# Patient Record
Sex: Female | Born: 1935 | Race: White | Hispanic: No | Smoking: Former smoker
Health system: Southern US, Community
[De-identification: ages and names within clinical notes are randomized; demographics above are authoritative.]

## PROBLEM LIST (undated history)

## (undated) DIAGNOSIS — S72009A Fracture of unspecified part of neck of unspecified femur, initial encounter for closed fracture: Secondary | ICD-10-CM

## (undated) DIAGNOSIS — E785 Hyperlipidemia, unspecified: Secondary | ICD-10-CM

## (undated) DIAGNOSIS — I10 Essential (primary) hypertension: Secondary | ICD-10-CM

## (undated) DIAGNOSIS — S42409A Unspecified fracture of lower end of unspecified humerus, initial encounter for closed fracture: Secondary | ICD-10-CM

## (undated) DIAGNOSIS — C9590 Leukemia, unspecified not having achieved remission: Secondary | ICD-10-CM

## (undated) DIAGNOSIS — C9110 Chronic lymphocytic leukemia of B-cell type not having achieved remission: Principal | ICD-10-CM

## (undated) HISTORY — DX: Leukemia, unspecified not having achieved remission: C95.90

## (undated) HISTORY — DX: Essential (primary) hypertension: I10

## (undated) HISTORY — DX: Hyperlipidemia, unspecified: E78.5

## (undated) HISTORY — DX: Chronic lymphocytic leukemia of B-cell type not having achieved remission: C91.10

## (undated) HISTORY — PX: TUBAL LIGATION: SHX77

---

## 2002-03-22 HISTORY — PX: CATARACT EXTRACTION: SUR2

## 2004-01-16 ENCOUNTER — Emergency Department (HOSPITAL_COMMUNITY): Admission: EM | Admit: 2004-01-16 | Discharge: 2004-01-16 | Payer: Self-pay | Admitting: Emergency Medicine

## 2004-08-29 ENCOUNTER — Ambulatory Visit: Payer: Self-pay | Admitting: Family Medicine

## 2004-10-30 ENCOUNTER — Ambulatory Visit: Payer: Self-pay | Admitting: Family Medicine

## 2004-11-07 ENCOUNTER — Ambulatory Visit: Payer: Self-pay | Admitting: Family Medicine

## 2004-12-06 ENCOUNTER — Ambulatory Visit: Payer: Self-pay | Admitting: Family Medicine

## 2005-07-03 ENCOUNTER — Ambulatory Visit: Payer: Self-pay | Admitting: Family Medicine

## 2005-07-04 ENCOUNTER — Ambulatory Visit: Payer: Self-pay | Admitting: Family Medicine

## 2005-07-06 ENCOUNTER — Ambulatory Visit: Payer: Self-pay | Admitting: Family Medicine

## 2005-07-12 ENCOUNTER — Ambulatory Visit: Payer: Self-pay | Admitting: Family Medicine

## 2005-08-06 ENCOUNTER — Ambulatory Visit: Payer: Self-pay

## 2005-08-06 ENCOUNTER — Encounter: Payer: Self-pay | Admitting: Family Medicine

## 2005-09-06 ENCOUNTER — Ambulatory Visit: Payer: Self-pay | Admitting: Family Medicine

## 2005-10-30 ENCOUNTER — Ambulatory Visit: Payer: Self-pay | Admitting: Family Medicine

## 2006-02-07 ENCOUNTER — Ambulatory Visit: Payer: Self-pay | Admitting: Family Medicine

## 2006-05-14 ENCOUNTER — Ambulatory Visit: Payer: Self-pay | Admitting: Family Medicine

## 2006-06-17 ENCOUNTER — Ambulatory Visit: Payer: Self-pay | Admitting: Family Medicine

## 2006-08-21 ENCOUNTER — Ambulatory Visit: Payer: Self-pay | Admitting: Family Medicine

## 2006-10-01 ENCOUNTER — Ambulatory Visit: Payer: Self-pay | Admitting: Family Medicine

## 2006-10-01 LAB — CONVERTED CEMR LAB
ALT: 19 U/L
AST: 28 U/L
Albumin: 3.7 g/dL
Alkaline Phosphatase: 62 U/L
BUN: 6 mg/dL
CO2: 25 meq/L
Calcium: 9.3 mg/dL
Chloride: 109 meq/L
Creatinine, Ser: 0.7 mg/dL
GFR calc non Af Amer: 86 mL/min
Glomerular Filtration Rate, Af Am: 104 mL/min/{1.73_m2}
Glucose, Bld: 92 mg/dL
Potassium: 4 meq/L
Sodium: 141 meq/L
Total Bilirubin: 0.7 mg/dL
Total Protein: 6.1 g/dL

## 2007-01-23 ENCOUNTER — Ambulatory Visit: Payer: Self-pay | Admitting: Family Medicine

## 2007-01-23 LAB — CONVERTED CEMR LAB
ALT: 20 units/L (ref 0–40)
AST: 23 units/L (ref 0–37)
Albumin: 3.5 g/dL (ref 3.5–5.2)
Alkaline Phosphatase: 73 units/L (ref 39–117)
BUN: 10 mg/dL (ref 6–23)
Bilirubin, Direct: 0.1 mg/dL (ref 0.0–0.3)
CO2: 28 meq/L (ref 19–32)
Calcium: 9.1 mg/dL (ref 8.4–10.5)
Chloride: 110 meq/L (ref 96–112)
Creatinine, Ser: 0.6 mg/dL (ref 0.4–1.2)
GFR calc Af Amer: 125 mL/min
GFR calc non Af Amer: 103 mL/min
Glucose, Bld: 74 mg/dL (ref 70–99)
Potassium: 3.7 meq/L (ref 3.5–5.1)
Sodium: 142 meq/L (ref 135–145)
Total Bilirubin: 0.6 mg/dL (ref 0.3–1.2)
Total Protein: 5.9 g/dL — ABNORMAL LOW (ref 6.0–8.3)

## 2007-01-28 ENCOUNTER — Encounter: Payer: Self-pay | Admitting: Family Medicine

## 2007-05-07 ENCOUNTER — Ambulatory Visit: Payer: Self-pay | Admitting: Family Medicine

## 2007-05-07 DIAGNOSIS — M810 Age-related osteoporosis without current pathological fracture: Secondary | ICD-10-CM | POA: Insufficient documentation

## 2007-05-07 DIAGNOSIS — E785 Hyperlipidemia, unspecified: Secondary | ICD-10-CM | POA: Insufficient documentation

## 2007-05-12 LAB — CONVERTED CEMR LAB
ALT: 15 units/L (ref 0–35)
AST: 23 units/L (ref 0–37)
Albumin: 3.8 g/dL (ref 3.5–5.2)
Alkaline Phosphatase: 68 units/L (ref 39–117)
BUN: 12 mg/dL (ref 6–23)
Basophils Absolute: 0 10*3/uL (ref 0.0–0.1)
Basophils Relative: 0.1 % (ref 0.0–1.0)
Bilirubin, Direct: 0.1 mg/dL (ref 0.0–0.3)
CO2: 29 meq/L (ref 19–32)
Calcium: 9.3 mg/dL (ref 8.4–10.5)
Chloride: 108 meq/L (ref 96–112)
Cholesterol: 181 mg/dL (ref 0–200)
Creatinine, Ser: 0.7 mg/dL (ref 0.4–1.2)
Eosinophils Absolute: 0.3 10*3/uL (ref 0.0–0.6)
Eosinophils Relative: 1.8 % (ref 0.0–5.0)
GFR calc Af Amer: 104 mL/min
GFR calc non Af Amer: 86 mL/min
Glucose, Bld: 90 mg/dL (ref 70–99)
HCT: 40.6 % (ref 36.0–46.0)
HDL: 57.9 mg/dL (ref >39.0)
Hemoglobin: 14.1 g/dL (ref 12.0–15.0)
LDL Cholesterol: 96 mg/dL (ref 0–99)
Lymphocytes Relative: 65.7 % — ABNORMAL HIGH (ref 12.0–46.0)
MCHC: 34.8 g/dL (ref 30.0–36.0)
MCV: 90.5 fL (ref 78.0–100.0)
Monocytes Absolute: 1 10*3/uL — ABNORMAL HIGH (ref 0.2–0.7)
Monocytes Relative: 6.7 % (ref 3.0–11.0)
Neutro Abs: 3.8 10*3/uL (ref 1.4–7.7)
Neutrophils Relative %: 25.7 % — ABNORMAL LOW (ref 43.0–77.0)
Platelets: 255 10*3/uL (ref 150–400)
Potassium: 4.1 meq/L (ref 3.5–5.1)
RBC: 4.49 M/uL (ref 3.87–5.11)
RDW: 12.4 % (ref 11.5–14.6)
Sodium: 144 meq/L (ref 135–145)
Total Bilirubin: 0.7 mg/dL (ref 0.3–1.2)
Total CHOL/HDL Ratio: 3.1
Total Protein: 6.3 g/dL (ref 6.0–8.3)
Triglycerides: 138 mg/dL (ref 0–149)
VLDL: 28 mg/dL (ref 0–40)
WBC: 14.8 10*3/uL — ABNORMAL HIGH (ref 4.5–10.5)

## 2007-05-26 ENCOUNTER — Ambulatory Visit: Payer: Self-pay | Admitting: Family Medicine

## 2007-05-27 DIAGNOSIS — D72829 Elevated white blood cell count, unspecified: Secondary | ICD-10-CM

## 2007-05-27 LAB — CONVERTED CEMR LAB
Basophils Absolute: 0 10*3/uL (ref 0.0–0.1)
Basophils Relative: 0.1 % (ref 0.0–1.0)
Eosinophils Absolute: 0.3 10*3/uL (ref 0.0–0.6)
Eosinophils Relative: 2.3 % (ref 0.0–5.0)
HCT: 37.8 % (ref 36.0–46.0)
Hemoglobin: 13.1 g/dL (ref 12.0–15.0)
Lymphocytes Relative: 65.2 % — ABNORMAL HIGH (ref 12.0–46.0)
MCHC: 34.6 g/dL (ref 30.0–36.0)
MCV: 91.5 fL (ref 78.0–100.0)
Monocytes Absolute: 1 10*3/uL — ABNORMAL HIGH (ref 0.2–0.7)
Monocytes Relative: 7.1 % (ref 3.0–11.0)
Neutro Abs: 3.6 10*3/uL (ref 1.4–7.7)
Neutrophils Relative %: 25.3 % — ABNORMAL LOW (ref 43.0–77.0)
Platelets: 238 10*3/uL (ref 150–400)
RBC: 4.13 M/uL (ref 3.87–5.11)
RDW: 12.5 % (ref 11.5–14.6)
WBC: 14.1 10*3/uL — ABNORMAL HIGH (ref 4.5–10.5)

## 2007-05-28 ENCOUNTER — Telehealth: Payer: Self-pay | Admitting: Family Medicine

## 2007-05-30 ENCOUNTER — Ambulatory Visit: Payer: Self-pay | Admitting: Oncology

## 2007-06-17 ENCOUNTER — Ambulatory Visit: Payer: Self-pay | Admitting: Family Medicine

## 2007-06-17 DIAGNOSIS — J019 Acute sinusitis, unspecified: Secondary | ICD-10-CM

## 2007-06-17 HISTORY — DX: Acute sinusitis, unspecified: J01.90

## 2007-07-01 ENCOUNTER — Ambulatory Visit: Payer: Self-pay | Admitting: Family Medicine

## 2007-07-01 DIAGNOSIS — D7289 Other specified disorders of white blood cells: Secondary | ICD-10-CM | POA: Insufficient documentation

## 2007-07-03 LAB — CONVERTED CEMR LAB
Basophils Absolute: 0 10*3/uL (ref 0.0–0.1)
Basophils Relative: 0 % (ref 0.0–1.0)
Eosinophils Absolute: 0.3 10*3/uL (ref 0.0–0.6)
Eosinophils Relative: 2.2 % (ref 0.0–5.0)
HCT: 37.6 % (ref 36.0–46.0)
Hemoglobin: 13.1 g/dL (ref 12.0–15.0)
Lymphocytes Relative: 60.5 % — ABNORMAL HIGH (ref 12.0–46.0)
MCHC: 34.8 g/dL (ref 30.0–36.0)
MCV: 92.4 fL (ref 78.0–100.0)
Monocytes Absolute: 0.9 10*3/uL — ABNORMAL HIGH (ref 0.2–0.7)
Monocytes Relative: 7.4 % (ref 3.0–11.0)
Neutro Abs: 3.8 10*3/uL (ref 1.4–7.7)
Neutrophils Relative %: 29.9 % — ABNORMAL LOW (ref 43.0–77.0)
Platelets: 226 10*3/uL (ref 150–400)
RBC: 4.07 M/uL (ref 3.87–5.11)
RDW: 12.8 % (ref 11.5–14.6)
WBC: 12.7 10*3/uL — ABNORMAL HIGH (ref 4.5–10.5)

## 2007-08-19 ENCOUNTER — Encounter (HOSPITAL_COMMUNITY): Payer: Self-pay | Admitting: Oncology

## 2007-08-19 ENCOUNTER — Other Ambulatory Visit (HOSPITAL_COMMUNITY): Payer: Self-pay | Admitting: Oncology

## 2007-08-19 ENCOUNTER — Other Ambulatory Visit: Admission: RE | Admit: 2007-08-19 | Discharge: 2007-08-19 | Payer: Self-pay | Admitting: Oncology

## 2007-08-19 ENCOUNTER — Encounter: Payer: Self-pay | Admitting: Family Medicine

## 2007-08-19 ENCOUNTER — Ambulatory Visit: Payer: Self-pay | Admitting: Oncology

## 2007-08-19 LAB — CBC WITH DIFFERENTIAL/PLATELET
Basophils Absolute: 0.1 10*3/uL (ref 0.0–0.1)
Eosinophils Absolute: 0.2 10*3/uL (ref 0.0–0.5)
HCT: 40.1 % (ref 34.8–46.6)
LYMPH%: 59.5 % — ABNORMAL HIGH (ref 14.0–48.0)
MCV: 90.2 fL (ref 81.0–101.0)
MONO#: 0.9 10*3/uL (ref 0.1–0.9)
NEUT#: 5.3 10*3/uL (ref 1.5–6.5)
NEUT%: 33.1 % — ABNORMAL LOW (ref 39.6–76.8)
Platelets: 260 10*3/uL (ref 145–400)
WBC: 16.1 10*3/uL — ABNORMAL HIGH (ref 3.9–10.0)

## 2007-08-19 LAB — COMPREHENSIVE METABOLIC PANEL
CO2: 23 mEq/L (ref 19–32)
Calcium: 9.8 mg/dL (ref 8.4–10.5)
Creatinine, Ser: 0.67 mg/dL (ref 0.40–1.20)
Glucose, Bld: 106 mg/dL — ABNORMAL HIGH (ref 70–99)
Total Bilirubin: 0.3 mg/dL (ref 0.3–1.2)
Total Protein: 6.6 g/dL (ref 6.0–8.3)

## 2007-08-19 LAB — LACTATE DEHYDROGENASE: LDH: 155 U/L (ref 94–250)

## 2007-09-03 ENCOUNTER — Ambulatory Visit: Payer: Self-pay | Admitting: Family Medicine

## 2007-09-22 ENCOUNTER — Ambulatory Visit: Payer: Self-pay | Admitting: Family Medicine

## 2007-09-22 DIAGNOSIS — C9110 Chronic lymphocytic leukemia of B-cell type not having achieved remission: Secondary | ICD-10-CM

## 2007-09-22 DIAGNOSIS — J069 Acute upper respiratory infection, unspecified: Secondary | ICD-10-CM | POA: Insufficient documentation

## 2007-09-22 HISTORY — DX: Chronic lymphocytic leukemia of B-cell type not having achieved remission: C91.10

## 2007-10-17 ENCOUNTER — Ambulatory Visit: Payer: Self-pay | Admitting: Oncology

## 2007-10-21 ENCOUNTER — Encounter: Payer: Self-pay | Admitting: Family Medicine

## 2007-10-21 LAB — CBC WITH DIFFERENTIAL/PLATELET
BASO%: 0.9 % (ref 0.0–2.0)
HCT: 39.8 % (ref 34.8–46.6)
HGB: 13.6 g/dL (ref 11.6–15.9)
MCHC: 34.3 g/dL (ref 32.0–36.0)
MONO#: 0.8 10*3/uL (ref 0.1–0.9)
NEUT%: 28.2 % — ABNORMAL LOW (ref 39.6–76.8)
RDW: 13.5 % (ref 11.3–14.5)
WBC: 15.6 10*3/uL — ABNORMAL HIGH (ref 3.9–10.0)
lymph#: 9.9 10*3/uL — ABNORMAL HIGH (ref 0.9–3.3)

## 2007-10-21 LAB — COMPREHENSIVE METABOLIC PANEL
ALT: 18 U/L (ref 0–35)
Albumin: 4.2 g/dL (ref 3.5–5.2)
CO2: 25 mEq/L (ref 19–32)
Calcium: 9.8 mg/dL (ref 8.4–10.5)
Chloride: 106 mEq/L (ref 96–112)
Creatinine, Ser: 0.61 mg/dL (ref 0.40–1.20)
Potassium: 4.3 mEq/L (ref 3.5–5.3)
Total Protein: 6.4 g/dL (ref 6.0–8.3)

## 2008-01-21 HISTORY — PX: CATARACT EXTRACTION: SUR2

## 2008-02-13 ENCOUNTER — Ambulatory Visit: Payer: Self-pay | Admitting: Oncology

## 2008-02-17 LAB — COMPREHENSIVE METABOLIC PANEL
AST: 15 U/L (ref 0–37)
Alkaline Phosphatase: 57 U/L (ref 39–117)
BUN: 12 mg/dL (ref 6–23)
Glucose, Bld: 84 mg/dL (ref 70–99)
Sodium: 141 mEq/L (ref 135–145)
Total Bilirubin: 0.3 mg/dL (ref 0.3–1.2)

## 2008-02-17 LAB — CBC WITH DIFFERENTIAL/PLATELET
EOS%: 1.5 % (ref 0.0–7.0)
Eosinophils Absolute: 0.2 10*3/uL (ref 0.0–0.5)
LYMPH%: 65.3 % — ABNORMAL HIGH (ref 14.0–48.0)
MCH: 31.1 pg (ref 26.0–34.0)
MCV: 91 fL (ref 81.0–101.0)
MONO%: 6 % (ref 0.0–13.0)
NEUT#: 4.1 10*3/uL (ref 1.5–6.5)
Platelets: 283 10*3/uL (ref 145–400)
RBC: 4.17 10*6/uL (ref 3.70–5.32)
RDW: 13.4 % (ref 11.3–14.5)

## 2008-05-21 ENCOUNTER — Telehealth (INDEPENDENT_AMBULATORY_CARE_PROVIDER_SITE_OTHER): Payer: Self-pay | Admitting: *Deleted

## 2008-05-27 ENCOUNTER — Ambulatory Visit: Payer: Self-pay | Admitting: Family Medicine

## 2008-05-28 ENCOUNTER — Encounter (INDEPENDENT_AMBULATORY_CARE_PROVIDER_SITE_OTHER): Payer: Self-pay | Admitting: *Deleted

## 2008-05-28 LAB — CONVERTED CEMR LAB
ALT: 18 units/L (ref 0–35)
AST: 23 units/L (ref 0–37)
Albumin: 3.9 g/dL (ref 3.5–5.2)
Alkaline Phosphatase: 75 units/L (ref 39–117)
BUN: 7 mg/dL (ref 6–23)
Basophils Absolute: 0 10*3/uL (ref 0.0–0.1)
Basophils Relative: 0.2 % (ref 0.0–3.0)
Bilirubin, Direct: 0.1 mg/dL (ref 0.0–0.3)
CO2: 28 meq/L (ref 19–32)
Calcium: 9.5 mg/dL (ref 8.4–10.5)
Chloride: 105 meq/L (ref 96–112)
Cholesterol: 176 mg/dL (ref 0–200)
Creatinine, Ser: 0.7 mg/dL (ref 0.4–1.2)
Eosinophils Absolute: 0.3 10*3/uL (ref 0.0–0.7)
Eosinophils Relative: 1.6 % (ref 0.0–5.0)
GFR calc Af Amer: 104 mL/min
GFR calc non Af Amer: 86 mL/min
Glucose, Bld: 93 mg/dL (ref 70–99)
HCT: 41.2 % (ref 36.0–46.0)
HDL: 54.8 mg/dL (ref >39.0)
Hemoglobin: 14.2 g/dL (ref 12.0–15.0)
LDL Cholesterol: 89 mg/dL (ref 0–99)
Lymphocytes Relative: 68.3 % — ABNORMAL HIGH (ref 12.0–46.0)
MCHC: 34.6 g/dL (ref 30.0–36.0)
MCV: 92.6 fL (ref 78.0–100.0)
Monocytes Absolute: 0.8 10*3/uL (ref 0.1–1.0)
Monocytes Relative: 4.9 % (ref 3.0–12.0)
Neutro Abs: 4.2 10*3/uL (ref 1.4–7.7)
Neutrophils Relative %: 25 % — ABNORMAL LOW (ref 43.0–77.0)
Platelets: 252 10*3/uL (ref 150–400)
Potassium: 4.3 meq/L (ref 3.5–5.1)
RBC: 4.45 M/uL (ref 3.87–5.11)
RDW: 12.9 % (ref 11.5–14.6)
Sodium: 140 meq/L (ref 135–145)
Total Bilirubin: 0.8 mg/dL (ref 0.3–1.2)
Total CHOL/HDL Ratio: 3.2
Total Protein: 6.5 g/dL (ref 6.0–8.3)
Triglycerides: 163 mg/dL — ABNORMAL HIGH (ref 0–149)
VLDL: 33 mg/dL (ref 0–40)
WBC: 16.8 10*3/uL — ABNORMAL HIGH (ref 4.5–10.5)

## 2008-06-16 ENCOUNTER — Ambulatory Visit: Payer: Self-pay | Admitting: Oncology

## 2008-06-18 ENCOUNTER — Encounter: Payer: Self-pay | Admitting: Family Medicine

## 2008-06-18 LAB — COMPREHENSIVE METABOLIC PANEL
Alkaline Phosphatase: 73 U/L (ref 39–117)
BUN: 10 mg/dL (ref 6–23)
Glucose, Bld: 81 mg/dL (ref 70–99)
Sodium: 139 mEq/L (ref 135–145)
Total Bilirubin: 0.4 mg/dL (ref 0.3–1.2)
Total Protein: 6.4 g/dL (ref 6.0–8.3)

## 2008-06-18 LAB — CBC WITH DIFFERENTIAL/PLATELET
EOS%: 2.1 % (ref 0.0–7.0)
Eosinophils Absolute: 0.4 10*3/uL (ref 0.0–0.5)
LYMPH%: 65 % — ABNORMAL HIGH (ref 14.0–48.0)
MCH: 31.3 pg (ref 26.0–34.0)
MCV: 93 fL (ref 81.0–101.0)
MONO%: 4.9 % (ref 0.0–13.0)
NEUT#: 4.8 10*3/uL (ref 1.5–6.5)
Platelets: 244 10*3/uL (ref 145–400)
RBC: 4.35 10*6/uL (ref 3.70–5.32)

## 2008-08-04 ENCOUNTER — Ambulatory Visit: Payer: Self-pay | Admitting: Family Medicine

## 2008-12-08 ENCOUNTER — Ambulatory Visit: Payer: Self-pay | Admitting: Family Medicine

## 2008-12-09 ENCOUNTER — Telehealth (INDEPENDENT_AMBULATORY_CARE_PROVIDER_SITE_OTHER): Payer: Self-pay | Admitting: *Deleted

## 2008-12-09 ENCOUNTER — Encounter (INDEPENDENT_AMBULATORY_CARE_PROVIDER_SITE_OTHER): Payer: Self-pay | Admitting: *Deleted

## 2008-12-14 ENCOUNTER — Ambulatory Visit: Payer: Self-pay | Admitting: Oncology

## 2008-12-16 ENCOUNTER — Encounter: Payer: Self-pay | Admitting: Family Medicine

## 2008-12-16 LAB — CBC WITH DIFFERENTIAL/PLATELET
BASO%: 0.2 % (ref 0.0–2.0)
EOS%: 1.1 % (ref 0.0–7.0)
HCT: 40.6 % (ref 34.8–46.6)
HGB: 13.6 g/dL (ref 11.6–15.9)
MCHC: 33.5 g/dL (ref 31.5–36.0)
MONO#: 0.9 10*3/uL (ref 0.1–0.9)
MONO%: 5.4 % (ref 0.0–14.0)
NEUT#: 4.4 10*3/uL (ref 1.5–6.5)
Platelets: 248 10*3/uL (ref 145–400)
WBC: 16 10*3/uL — ABNORMAL HIGH (ref 3.9–10.3)

## 2008-12-16 LAB — COMPREHENSIVE METABOLIC PANEL
ALT: 14 U/L (ref 0–35)
AST: 19 U/L (ref 0–37)
Albumin: 4.3 g/dL (ref 3.5–5.2)
Alkaline Phosphatase: 62 U/L (ref 39–117)
BUN: 11 mg/dL (ref 6–23)
Potassium: 4.6 mEq/L (ref 3.5–5.3)
Sodium: 140 mEq/L (ref 135–145)
Total Protein: 6.2 g/dL (ref 6.0–8.3)

## 2009-03-11 ENCOUNTER — Ambulatory Visit: Payer: Self-pay | Admitting: Family Medicine

## 2009-03-15 ENCOUNTER — Encounter: Payer: Self-pay | Admitting: Family Medicine

## 2009-03-24 ENCOUNTER — Encounter (INDEPENDENT_AMBULATORY_CARE_PROVIDER_SITE_OTHER): Payer: Self-pay | Admitting: *Deleted

## 2009-03-25 ENCOUNTER — Encounter (INDEPENDENT_AMBULATORY_CARE_PROVIDER_SITE_OTHER): Payer: Self-pay | Admitting: *Deleted

## 2009-03-28 ENCOUNTER — Telehealth: Payer: Self-pay | Admitting: Family Medicine

## 2009-04-19 ENCOUNTER — Ambulatory Visit: Payer: Self-pay | Admitting: Family Medicine

## 2009-04-22 ENCOUNTER — Telehealth: Payer: Self-pay | Admitting: Family Medicine

## 2009-05-04 ENCOUNTER — Encounter (INDEPENDENT_AMBULATORY_CARE_PROVIDER_SITE_OTHER): Payer: Self-pay | Admitting: *Deleted

## 2009-05-04 LAB — CONVERTED CEMR LAB
Alkaline Phosphatase: 52 units/L (ref 39–117)
BUN: 11 mg/dL (ref 6–23)
Bilirubin, Direct: 0 mg/dL (ref 0.0–0.3)
Chloride: 107 meq/L (ref 96–112)
Creatinine, Ser: 0.6 mg/dL (ref 0.4–1.2)
GGT: 26 units/L (ref 7–51)
Glucose, Bld: 73 mg/dL (ref 70–99)
HCV Ab: NEGATIVE
Total Bilirubin: 0.7 mg/dL (ref 0.3–1.2)
Total Protein: 6.3 g/dL (ref 6.0–8.3)

## 2009-07-12 ENCOUNTER — Ambulatory Visit: Payer: Self-pay | Admitting: Oncology

## 2009-07-14 ENCOUNTER — Encounter: Payer: Self-pay | Admitting: Family Medicine

## 2009-07-14 LAB — CBC WITH DIFFERENTIAL/PLATELET
BASO%: 0.3 % (ref 0.0–2.0)
EOS%: 1.1 % (ref 0.0–7.0)
HGB: 13.9 g/dL (ref 11.6–15.9)
MCH: 31.7 pg (ref 25.1–34.0)
MCHC: 34.2 g/dL (ref 31.5–36.0)
RBC: 4.38 10*6/uL (ref 3.70–5.45)
RDW: 13.5 % (ref 11.2–14.5)
lymph#: 9.8 10*3/uL — ABNORMAL HIGH (ref 0.9–3.3)

## 2009-07-14 LAB — COMPREHENSIVE METABOLIC PANEL
ALT: 18 U/L (ref 0–35)
AST: 21 U/L (ref 0–37)
Albumin: 4.3 g/dL (ref 3.5–5.2)
Alkaline Phosphatase: 67 U/L (ref 39–117)
Calcium: 9.6 mg/dL (ref 8.4–10.5)
Chloride: 105 mEq/L (ref 96–112)
Potassium: 4.3 mEq/L (ref 3.5–5.3)
Sodium: 141 mEq/L (ref 135–145)

## 2009-08-03 ENCOUNTER — Ambulatory Visit: Payer: Self-pay | Admitting: Family Medicine

## 2009-09-07 ENCOUNTER — Ambulatory Visit: Payer: Self-pay | Admitting: Family Medicine

## 2009-09-13 ENCOUNTER — Encounter: Payer: Self-pay | Admitting: Family Medicine

## 2009-09-21 ENCOUNTER — Telehealth (INDEPENDENT_AMBULATORY_CARE_PROVIDER_SITE_OTHER): Payer: Self-pay | Admitting: *Deleted

## 2009-09-27 ENCOUNTER — Telehealth (INDEPENDENT_AMBULATORY_CARE_PROVIDER_SITE_OTHER): Payer: Self-pay | Admitting: *Deleted

## 2009-09-29 ENCOUNTER — Telehealth (INDEPENDENT_AMBULATORY_CARE_PROVIDER_SITE_OTHER): Payer: Self-pay | Admitting: *Deleted

## 2009-10-20 ENCOUNTER — Ambulatory Visit: Payer: Self-pay | Admitting: Family Medicine

## 2010-01-06 ENCOUNTER — Ambulatory Visit: Payer: Self-pay | Admitting: Oncology

## 2010-01-10 ENCOUNTER — Encounter: Payer: Self-pay | Admitting: Family Medicine

## 2010-01-10 LAB — CBC WITH DIFFERENTIAL/PLATELET
BASO%: 0.2 % (ref 0.0–2.0)
LYMPH%: 66.7 % — ABNORMAL HIGH (ref 14.0–49.7)
MCHC: 34 g/dL (ref 31.5–36.0)
MONO#: 1.2 10*3/uL — ABNORMAL HIGH (ref 0.1–0.9)
RBC: 4.32 10*6/uL (ref 3.70–5.45)
WBC: 19.4 10*3/uL — ABNORMAL HIGH (ref 3.9–10.3)
lymph#: 13 10*3/uL — ABNORMAL HIGH (ref 0.9–3.3)

## 2010-01-10 LAB — COMPREHENSIVE METABOLIC PANEL
ALT: 14 U/L (ref 0–35)
CO2: 25 mEq/L (ref 19–32)
Sodium: 139 mEq/L (ref 135–145)
Total Bilirubin: 0.3 mg/dL (ref 0.3–1.2)
Total Protein: 6.1 g/dL (ref 6.0–8.3)

## 2010-01-10 LAB — LACTATE DEHYDROGENASE: LDH: 158 U/L (ref 94–250)

## 2010-01-23 ENCOUNTER — Ambulatory Visit: Payer: Self-pay | Admitting: Family Medicine

## 2010-01-24 ENCOUNTER — Encounter: Payer: Self-pay | Admitting: Family Medicine

## 2010-01-27 LAB — CONVERTED CEMR LAB
Basophils Absolute: 0 10*3/uL (ref 0.0–0.1)
Hemoglobin: 13.7 g/dL (ref 12.0–15.0)
Lymphocytes Relative: 87 % — ABNORMAL HIGH (ref 12–46)
Monocytes Absolute: 0.6 10*3/uL (ref 0.1–1.0)
Monocytes Relative: 5 % (ref 3–12)
Neutro Abs: 1 10*3/uL — ABNORMAL LOW (ref 1.7–7.7)
Neutrophils Relative %: 8 % — ABNORMAL LOW (ref 43–77)
RBC: 4.41 M/uL (ref 3.87–5.11)

## 2010-02-06 ENCOUNTER — Telehealth: Payer: Self-pay | Admitting: Family Medicine

## 2010-04-25 ENCOUNTER — Ambulatory Visit: Payer: Self-pay | Admitting: Family Medicine

## 2010-04-26 LAB — CONVERTED CEMR LAB
ALT: 18 units/L (ref 0–35)
AST: 25 units/L (ref 0–37)
Albumin: 3.9 g/dL (ref 3.5–5.2)
BUN: 11 mg/dL (ref 6–23)
Chloride: 107 meq/L (ref 96–112)
Cholesterol: 177 mg/dL (ref 0–200)
Creatinine, Ser: 0.6 mg/dL (ref 0.4–1.2)
Glucose, Bld: 82 mg/dL (ref 70–99)
LDL Cholesterol: 91 mg/dL (ref 0–99)
Potassium: 4.6 meq/L (ref 3.5–5.1)
Total Bilirubin: 0.6 mg/dL (ref 0.3–1.2)
Triglycerides: 82 mg/dL (ref 0.0–149.0)
VLDL: 16.4 mg/dL (ref 0.0–40.0)

## 2010-04-27 LAB — CONVERTED CEMR LAB
Basophils Absolute: 0 10*3/uL (ref 0.0–0.1)
HCT: 42.3 % (ref 36.0–46.0)
Lymphocytes Relative: 74 % — ABNORMAL HIGH (ref 12–46)
Lymphs Abs: 12.5 10*3/uL — ABNORMAL HIGH (ref 0.7–4.0)
Neutro Abs: 3.1 10*3/uL (ref 1.7–7.7)
Neutrophils Relative %: 18 % — ABNORMAL LOW (ref 43–77)
Platelets: 246 10*3/uL (ref 150–400)
RDW: 14 % (ref 11.5–15.5)
Vit D, 25-Hydroxy: 47 ng/mL (ref 30–89)
WBC: 16.9 10*3/uL — ABNORMAL HIGH (ref 4.0–10.5)

## 2010-07-11 ENCOUNTER — Ambulatory Visit: Payer: Self-pay | Admitting: Oncology

## 2010-07-13 ENCOUNTER — Encounter: Payer: Self-pay | Admitting: Family Medicine

## 2010-07-13 LAB — CBC WITH DIFFERENTIAL/PLATELET
Basophils Absolute: 0.1 10*3/uL (ref 0.0–0.1)
Eosinophils Absolute: 0.3 10*3/uL (ref 0.0–0.5)
HGB: 13.6 g/dL (ref 11.6–15.9)
MONO#: 0.9 10*3/uL (ref 0.1–0.9)
NEUT#: 3.8 10*3/uL (ref 1.5–6.5)
RBC: 4.28 10*6/uL (ref 3.70–5.45)
RDW: 12.9 % (ref 11.2–14.5)
WBC: 18.7 10*3/uL — ABNORMAL HIGH (ref 3.9–10.3)

## 2010-07-13 LAB — COMPREHENSIVE METABOLIC PANEL
Albumin: 4.5 g/dL (ref 3.5–5.2)
BUN: 11 mg/dL (ref 6–23)
CO2: 26 mEq/L (ref 19–32)
Calcium: 9.8 mg/dL (ref 8.4–10.5)
Chloride: 105 mEq/L (ref 96–112)
Glucose, Bld: 84 mg/dL (ref 70–99)
Potassium: 4.3 mEq/L (ref 3.5–5.3)
Sodium: 138 mEq/L (ref 135–145)
Total Protein: 6.3 g/dL (ref 6.0–8.3)

## 2010-07-13 LAB — LACTATE DEHYDROGENASE: LDH: 156 U/L (ref 94–250)

## 2010-08-04 ENCOUNTER — Ambulatory Visit: Payer: Self-pay | Admitting: Family Medicine

## 2010-11-07 ENCOUNTER — Other Ambulatory Visit: Payer: Self-pay | Admitting: Family Medicine

## 2010-11-07 ENCOUNTER — Telehealth: Payer: Self-pay | Admitting: Family Medicine

## 2010-11-07 ENCOUNTER — Encounter: Payer: Self-pay | Admitting: Family Medicine

## 2010-11-07 ENCOUNTER — Ambulatory Visit
Admission: RE | Admit: 2010-11-07 | Discharge: 2010-11-07 | Payer: Self-pay | Source: Home / Self Care | Attending: Family Medicine | Admitting: Family Medicine

## 2010-11-07 LAB — BASIC METABOLIC PANEL
BUN: 15 mg/dL (ref 6–23)
CO2: 28 mEq/L (ref 19–32)
Calcium: 9.6 mg/dL (ref 8.4–10.5)
Chloride: 103 mEq/L (ref 96–112)
Creatinine, Ser: 0.7 mg/dL (ref 0.4–1.2)
GFR: 91.26 mL/min (ref >60.00)
Glucose, Bld: 82 mg/dL (ref 70–99)
Potassium: 4.3 mEq/L (ref 3.5–5.1)
Sodium: 140 mEq/L (ref 135–145)

## 2010-11-07 LAB — HEPATIC FUNCTION PANEL
ALT: 26 U/L (ref 0–35)
AST: 30 U/L (ref 0–37)
Albumin: 4 g/dL (ref 3.5–5.2)
Alkaline Phosphatase: 67 U/L (ref 39–117)
Bilirubin, Direct: 0.1 mg/dL (ref 0.0–0.3)
Total Bilirubin: 0.6 mg/dL (ref 0.3–1.2)
Total Protein: 6.6 g/dL (ref 6.0–8.3)

## 2010-11-07 LAB — LIPID PANEL
Cholesterol: 179 mg/dL (ref 0–200)
HDL: 65.9 mg/dL (ref >39.00)
LDL Cholesterol: 88 mg/dL (ref 0–99)
Total CHOL/HDL Ratio: 3
Triglycerides: 127 mg/dL (ref 0.0–149.0)
VLDL: 25.4 mg/dL (ref 0.0–40.0)

## 2010-11-07 LAB — TSH: TSH: 1.5 u[IU]/mL (ref 0.35–5.50)

## 2010-11-08 LAB — CONVERTED CEMR LAB
Basophils Absolute: 0 10*3/uL (ref 0.0–0.1)
Basophils Relative: 0 % (ref 0–1)
Hemoglobin: 14.7 g/dL (ref 12.0–15.0)
Lymphocytes Relative: 74 % — ABNORMAL HIGH (ref 12–46)
MCHC: 34 g/dL (ref 30.0–36.0)
Monocytes Absolute: 0.5 10*3/uL (ref 0.1–1.0)
Monocytes Relative: 3 % (ref 3–12)
Neutro Abs: 4 10*3/uL (ref 1.7–7.7)
Neutrophils Relative %: 21 % — ABNORMAL LOW (ref 43–77)
RBC: 4.72 M/uL (ref 3.87–5.11)
RDW: 13.2 % (ref 11.5–15.5)

## 2010-11-19 LAB — CONVERTED CEMR LAB
ALT: 19 units/L (ref 0–35)
ALT: 19 units/L (ref 0–35)
AST: 26 units/L (ref 0–37)
AST: 26 units/L (ref 0–37)
AST: 27 units/L (ref 0–37)
Albumin: 3.6 g/dL (ref 3.5–5.2)
Albumin: 3.9 g/dL (ref 3.5–5.2)
Alkaline Phosphatase: 61 units/L (ref 39–117)
Alkaline Phosphatase: 66 units/L (ref 39–117)
Alkaline Phosphatase: 67 units/L (ref 39–117)
BUN: 11 mg/dL (ref 6–23)
Basophils Absolute: 0 10*3/uL (ref 0.0–0.1)
Basophils Relative: 0.1 % (ref 0.0–3.0)
Bilirubin, Direct: 0 mg/dL (ref 0.0–0.3)
Bilirubin, Direct: 0 mg/dL (ref 0.0–0.3)
Bilirubin, Direct: 0.1 mg/dL (ref 0.0–0.3)
CO2: 28 meq/L (ref 19–32)
CO2: 28 meq/L (ref 19–32)
Calcium: 9.2 mg/dL (ref 8.4–10.5)
Calcium: 9.3 mg/dL (ref 8.4–10.5)
Calcium: 9.7 mg/dL (ref 8.4–10.5)
Chloride: 106 meq/L (ref 96–112)
Cholesterol: 178 mg/dL (ref 0–200)
Creatinine, Ser: 0.7 mg/dL (ref 0.4–1.2)
Creatinine, Ser: 0.7 mg/dL (ref 0.4–1.2)
Creatinine, Ser: 0.7 mg/dL (ref 0.4–1.2)
Direct LDL: 93.9 mg/dL
Eosinophils Absolute: 0.3 10*3/uL (ref 0.0–0.7)
Eosinophils Relative: 2.1 % (ref 0.0–5.0)
GFR calc Af Amer: 104 mL/min
GFR calc non Af Amer: 85.43 mL/min (ref >60)
GFR calc non Af Amer: 86 mL/min
Glucose, Bld: 76 mg/dL (ref 70–99)
HCT: 40.1 % (ref 36.0–46.0)
HDL: 52.7 mg/dL (ref >39.0)
Hemoglobin: 14 g/dL (ref 12.0–15.0)
Lymphocytes Relative: 72.6 % — ABNORMAL HIGH (ref 12.0–46.0)
MCHC: 34.9 g/dL (ref 30.0–36.0)
MCV: 90.6 fL (ref 78.0–100.0)
Monocytes Absolute: 0.9 10*3/uL (ref 0.1–1.0)
Monocytes Relative: 5.9 % (ref 3.0–12.0)
Neutro Abs: 3 10*3/uL (ref 1.4–7.7)
Neutrophils Relative %: 19.3 % — ABNORMAL LOW (ref 43.0–77.0)
Platelets: 232 10*3/uL (ref 150–400)
Potassium: 4.1 meq/L (ref 3.5–5.1)
RBC: 4.43 M/uL (ref 3.87–5.11)
RDW: 13 % (ref 11.5–14.6)
Sodium: 142 meq/L (ref 135–145)
Sodium: 143 meq/L (ref 135–145)
Total Bilirubin: 0.5 mg/dL (ref 0.3–1.2)
Total Bilirubin: 0.6 mg/dL (ref 0.3–1.2)
Total Bilirubin: 0.7 mg/dL (ref 0.3–1.2)
Total CHOL/HDL Ratio: 3.4
Total Protein: 6.2 g/dL (ref 6.0–8.3)
Total Protein: 6.2 g/dL (ref 6.0–8.3)
Total Protein: 6.5 g/dL (ref 6.0–8.3)
Triglycerides: 210 mg/dL (ref 0–149)
VLDL: 42 mg/dL — ABNORMAL HIGH (ref 0–40)
WBC: 15.7 10*3/uL — ABNORMAL HIGH (ref 4.5–10.5)

## 2010-11-21 NOTE — Assessment & Plan Note (Signed)
Summary: 6 month ov(30 mim)//ccm//rsh from bmp//lch   Vital Signs:  Patient profile:   77 year old female Height:      62 inches (157.48 cm) Weight:      134.13 pounds (60.97 kg) BMI:     24.62 O2 Sat:      95 % on Room air Temp:     97.8 degrees F (36.56 degrees C) oral Pulse rate:   88 / minute BP sitting:   140 / 80  (right arm)  Vitals Entered By: Lucious Groves (April 25, 2010 8:28 AM)  O2 Flow:  Room air CC: 6 mo ov./kb Is Patient Diabetic? No Comments Patient states that she would like to take Lovaza alternative OTC and is needs refill of Simvastatin./kb   History of Present Illness:  Hyperlipidemia follow-up      This is an 77 year old woman who presents for Hyperlipidemia follow-up.  The patient denies muscle aches, GI upset, abdominal pain, flushing, itching, constipation, diarrhea, and fatigue.  The patient denies the following symptoms: chest pain/pressure, exercise intolerance, dypsnea, palpitations, syncope, and pedal edema.  Compliance with medications (by patient report) has been near 100%.  Dietary compliance has been good.  The patient reports exercising occasionally.  Adjunctive measures currently used by the patient include fish oil supplements.    Preventive Screening-Counseling & Management  Alcohol-Tobacco     Alcohol drinks/day: 0     Smoking Status: quit     Year Quit: 1961     Pack years: 1.5     Passive Smoke Exposure: no  Caffeine-Diet-Exercise     Caffeine use/day: 1     Does Patient Exercise: yes     Type of exercise: walking     Times/week: <3  Hep-HIV-STD-Contraception     HIV Risk: no     Dental Visit-last 6 months yes     Dental Care Counseling: not indicated; dental care within six months     SBE monthly: yes  Safety-Violence-Falls     Seat Belt Use: 100  Current Medications (verified): 1)  Zocor 40 Mg Tabs (Simvastatin) .... Take One Tablet At Bedtime. Needs Labwork. 2)  Adult Aspirin Low Strength 81 Mg  Tbdp (Aspirin) .Marland Kitchen.. 1 Once  Daily 3)  Ranitidine Hcl 150 Mg  Caps (Ranitidine Hcl) .... Otc As Needed 4)  Calicum 5)  Mvi 6)  Fish Oil 1000 Mg Caps (Omega-3 Fatty Acids) .... 2 By Mouth Two Times A Day  Allergies (verified): No Known Drug Allergies  Past History:  Past Medical History: Last updated: 05/07/2007 Hyperlipidemia Osteoporosis  Past Surgical History: Last updated: 05/27/2008 Cataract extraction L 6/03 Tubal ligation Cataract extraction (4/09)R   Family History: Last updated: 05/07/2007 family History of Stroke F 1st degree relative <60 Alz. dementia  Park. dz  Social History: Last updated: 05/07/2007 Retired- Advice worker Married- widowed Former Smoker- 48 years ago Alcohol use-no Drug use-no Regular exercise-no  Risk Factors: Alcohol Use: 0 (04/25/2010) Caffeine Use: 1 (04/25/2010) Exercise: yes (04/25/2010)  Risk Factors: Smoking Status: quit (04/25/2010) Passive Smoke Exposure: no (04/25/2010)  Family History: Reviewed history from 05/07/2007 and no changes required. family History of Stroke F 1st degree relative <60 Alz. dementia  Park. dz  Social History: Reviewed history from 05/07/2007 and no changes required. Retired- Advice worker Married- widowed Former Smoker- 48 years ago Alcohol use-no Drug use-no Regular exercise-no Does Patient Exercise:  yes Dental Care w/in 6 mos.:  yes  Review of Systems  See HPI  Physical Exam  General:  Well-developed,well-nourished,in no acute distress; alert,appropriate and cooperative throughout examination Ears:  hearing aids b/l Neck:  bruit R carotid , supple.   Lungs:  Normal respiratory effort, chest expands symmetrically. Lungs are clear to auscultation, no crackles or wheezes. Heart:  normal rate and no murmur.   Extremities:  No clubbing, cyanosis, edema, or deformity noted with normal full range of motion of all joints.   Skin:  Intact without suspicious lesions or rashes Psych:  Oriented X3,  normally interactive, good eye contact, not anxious appearing, and not depressed appearing.     Impression & Recommendations:  Problem # 1:  HYPERLIPIDEMIA (ICD-272.4)  Her updated medication list for this problem includes:    Zocor 40 Mg Tabs (Simvastatin) .Marland Kitchen... Take one tablet at bedtime. needs labwork.  Orders: Venipuncture (98119) TLB-Lipid Panel (80061-LIPID) TLB-BMP (Basic Metabolic Panel-BMET) (80048-METABOL) TLB-CBC Platelet - w/Differential (85025-CBCD) TLB-Hepatic/Liver Function Pnl (80076-HEPATIC) T-Vitamin D (25-Hydroxy) (14782-95621)  Problem # 2:  LEUKEMIA, LYMPHOCYTIC, CHRONIC (ICD-204.10) per heme  Problem # 3:  OSTEOPOROSIS (ICD-733.00) bmd per gyn Orders: Venipuncture (30865) TLB-Lipid Panel (80061-LIPID) TLB-BMP (Basic Metabolic Panel-BMET) (80048-METABOL) TLB-CBC Platelet - w/Differential (85025-CBCD) TLB-Hepatic/Liver Function Pnl (80076-HEPATIC) T-Vitamin D (25-Hydroxy) (78469-62952)  Complete Medication List: 1)  Zocor 40 Mg Tabs (Simvastatin) .... Take one tablet at bedtime. needs labwork. 2)  Adult Aspirin Low Strength 81 Mg Tbdp (Aspirin) .Marland Kitchen.. 1 once daily 3)  Ranitidine Hcl 150 Mg Caps (Ranitidine hcl) .... Otc as needed 4)  Calicum  5)  Mvi  6)  Fish Oil 1000 Mg Caps (Omega-3 fatty acids) .... 2 by mouth two times a day  Patient Instructions: 1)  Please schedule a follow-up appointment in 6 months -- medicare physical Prescriptions: ZOCOR 40 MG TABS (SIMVASTATIN) take one tablet at bedtime. NEEDS LABWORK.  #30 Tablet x 5   Entered and Authorized by:   Loreen Freud DO   Signed by:   Loreen Freud DO on 04/25/2010   Method used:   Electronically to        [facility] [phone]* (retail)       [address]       [place], [place]  [address]       Ph: [phone]       Fax: [phone]   RxID:   [id number]   Appended Document: Orders Update    Clinical Lists Changes  Orders: Added new Test order of T- * Misc. Laboratory test  [phone]) - Signed

## 2010-11-21 NOTE — Assessment & Plan Note (Signed)
Summary: FLU SHOT///SPH  Nurse Visit Flu Vaccine Consent Questions     Do you have a history of severe allergic reactions to this vaccine? no    Any prior history of allergic reactions to egg and/or gelatin? no    Do you have a sensitivity to the preservative Thimersol? no    Do you have a past history of Guillan-Barre Syndrome? no    Do you currently have an acute febrile illness? no    Have you ever had a severe reaction to latex? no    Vaccine information given and explained to patient? yes    Are you currently pregnant? no    Lot Number:[id number]   Exp Date:04/21/2011   Site Given  Left Deltoid IM   Allergies: No Known Drug Allergies  Orders Added: 1)  Flu Vaccine 26yrs + MEDICARE PATIENTS [Q2039] 2)  Administration Flu vaccine - MCR [G0008]

## 2010-11-21 NOTE — Letter (Signed)
Summary: [facility]  [facility]   Imported By: Lanelle Bal 07/31/2010 14:20:51  _____________________________________________________________________  External Attachment:    Type:   Image     Comment:   External Document

## 2010-11-21 NOTE — Progress Notes (Signed)
Summary: Lab results   Phone Note Outgoing Call   Call placed by: Army Fossa CMA,  February 06, 2010 11:46 AM Reason for Call: Discuss lab or test results Summary of Call: Regarding lab results, LMTCB:  LDL particles and TG elevated----add fenofibrate 160 mg #30 1 by mouth once daily 2 refills recheck labs in 3 months---272.4  Nmr, hep Signed by Loreen Freud DO on 02/04/2010 at 9:58 AM  Follow-up for Phone Call        Pt states the fenofibrate messed up her liver when she took in the past. Please advise. Army Fossa CMA  February 06, 2010 3:04 PM   Additional Follow-up for Phone Call Additional follow up Details #1::        try lovaza 1 g  2 by mouth two times a day instead #120  2 refills Additional Follow-up by: Loreen Freud DO,  February 06, 2010 3:17 PM    Additional Follow-up for Phone Call Additional follow up Details #2::    Pt is aware. Army Fossa CMA  February 06, 2010 3:23 PM   New/Updated Medications: LOVAZA 1 GM CAPS (OMEGA-3-ACID ETHYL ESTERS) 2 by mouth two times a day. Prescriptions: LOVAZA 1 GM CAPS (OMEGA-3-ACID ETHYL ESTERS) 2 by mouth two times a day.  #120 x 2   Entered by:   Army Fossa CMA   Authorized by:   Loreen Freud DO   Signed by:   Army Fossa CMA on 02/06/2010   Method used:   Electronically to        [facility] [phone]* (retail)       [address]       [place], [place]  [address]       Ph: [phone]       Fax: [phone]   RxID:   [id number]

## 2010-11-21 NOTE — Letter (Signed)
[facility]   Imported By: Lanelle Bal 02/06/2010 09:34:55  _____________________________________________________________________  External Attachment:    Type:   Image     Comment:   External Document

## 2010-11-23 NOTE — Progress Notes (Signed)
Summary: [facility]  Phone Note From Other Clinic   Caller: [facility]  Summary of Call: Patient with white cell count of 18.6 and atypical lymphs. Results not final (has to be verified through [facility]) Shonna Chock CMA  November 07, 2010 1:51 PM   Follow-up for Phone Call        pt has leukemia--- she is normally at 20 Follow-up by: Loreen Freud DO,  November 07, 2010 2:00 PM  Additional Follow-up for Phone Call Additional follow up Details #1::        spoke with Clydie Braun @ [facility] and she stated that in the future it would save time; if we are aware that our patient has CLL or leukemia to just go ahead and send the labs to [place] instead of to [place]....  Additional Follow-up by: Almeta Monas CMA Duncan Dull),  November 07, 2010 3:26 PM

## 2010-11-23 NOTE — Assessment & Plan Note (Addendum)
Summary: medicare cpx/cbs   Vital Signs:  Patient profile:   77 year old female Height:      62 inches Weight:      137.0 pounds Pulse rate:   96 / minute Pulse rhythm:   regular BP sitting:   132 / 76  (right arm) Cuff size:   regular  Vitals Entered By: Almeta Monas CMA Duncan Dull) (November 07, 2010 9:54 AM) CC: cpx/fasting  Does patient need assistance? Functional Status Self care, Cook/clean, Shopping, Social activities Ambulation Normal Comments pt is able to do all ADLs and can read and write.    Vision Screening:      Vision Comments: Pt sees optho q1y hearing --- + hearing aids   History of Present Illness: Pt here for cpe and labs.  No pap.  Pt still going to cancer center for blood work.    Hyperlipidemia follow-up      This is an 77 year old woman who presents for Hyperlipidemia follow-up.  The patient denies muscle aches, GI upset, abdominal pain, flushing, itching, constipation, diarrhea, and fatigue.  The patient denies the following symptoms: chest pain/pressure, exercise intolerance, dypsnea, palpitations, syncope, and pedal edema.  Compliance with medications (by patient report) has been near 100%.  Dietary compliance has been good.  The patient reports exercising occasionally.  Adjunctive measures currently used by the patient include ASA.    Optho---[facility]--Dr Landry Corporal-- Dr Danella Deis gyn-- Dr Cliffton Asters Onc--Dr Murinson  Preventive Screening-Counseling & Management  Alcohol-Tobacco     Alcohol drinks/day: 0     Smoking Status: quit     Year Quit: 1961     Pack years: 1.5     Passive Smoke Exposure: no  Caffeine-Diet-Exercise     Caffeine use/day: 1     Does Patient Exercise: yes     Type of exercise: walking     Times/week: <3  Hep-HIV-STD-Contraception     HIV Risk: no     Dental Visit-last 6 months yes     Dental Care Counseling: not indicated; dental care within six months     SBE monthly: yes  Safety-Violence-Falls     Seat  Belt Use: 100     Firearms in the Home: firearms in the home     Firearm Counseling: not indicated; uses recommended firearm safety measures     Smoke Detectors: yes     Smoke Detector Counseling: no     Violence in the Home: no risk noted     Sexual Abuse: no     Fall Risk: no      Sexual History:  widow.    Problems Prior to Update: 1)  Preventive Health Care  (ICD-V70.0) 2)  Uri  (ICD-465.9) 3)  Leukemia, Lymphocytic, Chronic  (ICD-204.10) 4)  Disease, White Blood Cell Nec  (ICD-288.8) 5)  Sinusitis, Acute Nos  (ICD-461.9) 6)  Leukocytosis Nos  (ICD-288.60) 7)  Osteoporosis  (ICD-733.00) 8)  Hyperlipidemia  (ICD-272.4)  Medications Prior to Update: 1)  Zocor 40 Mg Tabs (Simvastatin) .... Take One Tablet At Bedtime. Needs Labwork. 2)  Adult Aspirin Low Strength 81 Mg  Tbdp (Aspirin) .Marland Kitchen.. 1 Once Daily 3)  Ranitidine Hcl 150 Mg  Caps (Ranitidine Hcl) .... Otc As Needed 4)  Calicum 5)  Mvi 6)  Fish Oil 1000 Mg Caps (Omega-3 Fatty Acids) .... 2 By Mouth Two Times A Day  Current Medications (verified): 1)  Zocor 40 Mg Tabs (Simvastatin) .... Take One Tablet At Bedtime. Needs Labwork. 2)  Adult Aspirin Low Strength 81 Mg  Tbdp (Aspirin) .Marland Kitchen.. 1 Once Daily 3)  Ranitidine Hcl 150 Mg  Caps (Ranitidine Hcl) .... Otc As Needed 4)  Calcium 600 600 Mg Tabs (Calcium Carbonate) .Marland Kitchen.. 1 By Mouth Once Daily 5)  Centrum Silver  Tabs (Multiple Vitamins-Minerals) .... By Mouth Once Daily 6)  Fish Oil 1000 Mg Caps (Omega-3 Fatty Acids) .... 2 By Mouth Two Times A Day 7)  Zostavax 16109 Unt/0.52ml Solr (Zoster Vaccine Live) .Marland Kitchen.. 1 Ml  Im X1  Allergies (verified): No Known Drug Allergies  Past History:  Past Medical History: Last updated: 05/07/2007 Hyperlipidemia Osteoporosis  Past Surgical History: Last updated: 05/27/2008 Cataract extraction L 6/03 Tubal ligation Cataract extraction (4/09)R   Family History: Last updated: 05/07/2007 family History of Stroke F 1st degree relative  <60 Alz. dementia  Park. dz  Social History: Last updated: 05/07/2007 Retired- Advice worker Married- widowed Former Smoker- 48 years ago Alcohol use-no Drug use-no Regular exercise-no  Risk Factors: Alcohol Use: 0 (11/07/2010) Caffeine Use: 1 (11/07/2010) Exercise: yes (11/07/2010)  Risk Factors: Smoking Status: quit (11/07/2010) Passive Smoke Exposure: no (11/07/2010)  Family History: Reviewed history from 05/07/2007 and no changes required. family History of Stroke F 1st degree relative <60 Alz. dementia  Park. dz  Social History: Reviewed history from 05/07/2007 and no changes required. Retired- Advice worker Married- widowed Former Smoker- 48 years ago Alcohol use-no Drug use-no Regular exercise-no Fall Risk:  no Sexual History:  widow  Review of Systems      See HPI General:  Denies chills, fatigue, fever, loss of appetite, malaise, sleep disorder, sweats, weakness, and weight loss. Eyes:  Denies blurring, discharge, double vision, eye irritation, eye pain, halos, itching, light sensitivity, red eye, vision loss-1 eye, and vision loss-both eyes. ENT:  Complains of decreased hearing; denies difficulty swallowing, ear discharge, earache, hoarseness, nasal congestion, nosebleeds, postnasal drainage, ringing in ears, sinus pressure, and sore throat; + hearing aids. CV:  Denies bluish discoloration of lips or nails, chest pain or discomfort, difficulty breathing at night, difficulty breathing while lying down, fainting, fatigue, leg cramps with exertion, lightheadness, near fainting, palpitations, shortness of breath with exertion, swelling of feet, swelling of hands, and weight gain. Resp:  Denies chest discomfort, chest pain with inspiration, cough, coughing up blood, excessive snoring, hypersomnolence, morning headaches, pleuritic, shortness of breath, sputum productive, and wheezing. GI:  Denies abdominal pain, bloody stools, change in bowel habits,  constipation, dark tarry stools, diarrhea, excessive appetite, gas, hemorrhoids, indigestion, loss of appetite, nausea, vomiting, vomiting blood, and yellowish skin color. GU:  Denies abnormal vaginal bleeding, decreased libido, discharge, dysuria, genital sores, hematuria, incontinence, nocturia, urinary frequency, and urinary hesitancy. MS:  Denies joint pain, joint redness, joint swelling, loss of strength, low back pain, mid back pain, muscle aches, muscle , cramps, muscle weakness, stiffness, and thoracic pain. Derm:  Denies changes in color of skin, changes in nail beds, dryness, excessive perspiration, flushing, hair loss, insect bite(s), itching, lesion(s), poor wound healing, and rash. Neuro:  Denies brief paralysis, difficulty with concentration, disturbances in coordination, falling down, headaches, inability to speak, memory loss, numbness, poor balance, seizures, sensation of room spinning, tingling, tremors, visual disturbances, and weakness. Psych:  Denies alternate hallucination ( auditory/visual), anxiety, depression, easily angered, easily tearful, irritability, mental problems, panic attacks, sense of great danger, suicidal thoughts/plans, thoughts of violence, unusual visions or sounds, and thoughts /plans of harming others. Endo:  Denies cold intolerance, excessive hunger, excessive thirst, excessive urination, heat intolerance, polyuria, and  weight change. Heme:  Denies abnormal bruising, bleeding, enlarge lymph nodes, fevers, pallor, and skin discoloration. Allergy:  Denies hives or rash, itching eyes, persistent infections, seasonal allergies, and sneezing.  Physical Exam  General:  Well-developed,well-nourished,in no acute distress; alert,appropriate and cooperative throughout examination Head:  Normocephalic and atraumatic without obvious abnormalities. No apparent alopecia or balding. Eyes:  pupils equal, pupils round, pupils reactive to light, and no injection.   Ears:   External ear exam shows no significant lesions or deformities.  Otoscopic examination reveals clear canals, tympanic membranes are intact bilaterally without bulging, retraction, inflammation or discharge. Hearing is grossly normal bilaterally. Nose:  External nasal examination shows no deformity or inflammation. Nasal mucosa are pink and moist without lesions or exudates. Mouth:  Oral mucosa and oropharynx without lesions or exudates.  Teeth in good repair. Neck:  No deformities, masses, or tenderness noted. Chest Wall:  No deformities, masses, or tenderness noted. Breasts:  gyn Lungs:  Normal respiratory effort, chest expands symmetrically. Lungs are clear to auscultation, no crackles or wheezes. Heart:  normal rate and no murmur.   Abdomen:  Bowel sounds positive,abdomen soft and non-tender without masses, organomegaly or hernias noted. Rectal:  gyn Genitalia:  gyn Msk:  normal ROM, no joint tenderness, no joint swelling, no joint warmth, no redness over joints, no joint deformities, no joint instability, and no crepitation.   Pulses:  R posterior tibial normal, R dorsalis pedis normal, R carotid normal, L posterior tibial normal, L dorsalis pedis normal, and L carotid normal.   Extremities:  No clubbing, cyanosis, edema, or deformity noted with normal full range of motion of all joints.   Neurologic:  No cranial nerve deficits noted. Station and gait are normal. Plantar reflexes are down-going bilaterally. DTRs are symmetrical throughout. Sensory, motor and coordinative functions appear intact. Skin:  Intact without suspicious lesions or rashes Cervical Nodes:  No lymphadenopathy noted Axillary Nodes:  No palpable lymphadenopathy Psych:  Cognition and judgment appear intact. Alert and cooperative with normal attention span and concentration. No apparent delusions, illusions, hallucinationsnot agitated, not suicidal, and not homicidal.     Impression & Recommendations:  Problem # 1:   PREVENTIVE HEALTH CARE (ICD-V70.0)  Orders: Venipuncture (16109) TLB-Lipid Panel (80061-LIPID) TLB-BMP (Basic Metabolic Panel-BMET) (80048-METABOL) TLB-CBC Platelet - w/Differential (85025-CBCD) TLB-Hepatic/Liver Function Pnl (80076-HEPATIC) TLB-TSH (Thyroid Stimulating Hormone) (84443-TSH) T-Vitamin D (25-Hydroxy) (60454-09811) Specimen Handling (91478) Medicare -1st Annual Wellness Visit [phone])  Problem # 2:  LEUKEMIA, LYMPHOCYTIC, CHRONIC (ICD-204.10)  Orders: Venipuncture (13086) TLB-Lipid Panel (80061-LIPID) TLB-BMP (Basic Metabolic Panel-BMET) (80048-METABOL) TLB-CBC Platelet - w/Differential (85025-CBCD) TLB-Hepatic/Liver Function Pnl (80076-HEPATIC) TLB-TSH (Thyroid Stimulating Hormone) (84443-TSH) T-Vitamin D (25-Hydroxy) (57846-96295) Specimen Handling (28413)  Problem # 3:  OSTEOPOROSIS (ICD-733.00)  Her updated medication list for this problem includes:    Calcium 600 600 Mg Tabs (Calcium carbonate) .Marland Kitchen... 1 by mouth once daily  Orders: Venipuncture (24401) TLB-Lipid Panel (80061-LIPID) TLB-BMP (Basic Metabolic Panel-BMET) (80048-METABOL) TLB-CBC Platelet - w/Differential (85025-CBCD) TLB-Hepatic/Liver Function Pnl (80076-HEPATIC) TLB-TSH (Thyroid Stimulating Hormone) (84443-TSH) T-Vitamin D (25-Hydroxy) (02725-36644) Specimen Handling (03474)  Bone Density: per gyn (01/05/2009) Vit D:47 (04/25/2010)  Problem # 4:  HYPERLIPIDEMIA (ICD-272.4)  Her updated medication list for this problem includes:    Zocor 40 Mg Tabs (Simvastatin) .Marland Kitchen... Take one tablet at bedtime. needs labwork.  Orders: Venipuncture (25956) TLB-Lipid Panel (80061-LIPID) TLB-BMP (Basic Metabolic Panel-BMET) (80048-METABOL) TLB-CBC Platelet - w/Differential (85025-CBCD) TLB-Hepatic/Liver Function Pnl (80076-HEPATIC) TLB-TSH (Thyroid Stimulating Hormone) (84443-TSH) T-Vitamin D (25-Hydroxy) (38756-43329) Specimen Handling (51884)  Labs Reviewed: SGOT: 25 (  04/25/2010)   SGPT: 18  (04/25/2010)   HDL:70.00 (04/25/2010), 52.7 (12/08/2008)  LDL:91 (04/25/2010), DEL (04/54/0981)  Chol:177 (04/25/2010), 178 (12/08/2008)  Trig:82.0 (04/25/2010), 210 (12/08/2008)  Complete Medication List: 1)  Zocor 40 Mg Tabs (Simvastatin) .... Take one tablet at bedtime. needs labwork. 2)  Adult Aspirin Low Strength 81 Mg Tbdp (Aspirin) .Marland Kitchen.. 1 once daily 3)  Ranitidine Hcl 150 Mg Caps (Ranitidine hcl) .... Otc as needed 4)  Calcium 600 600 Mg Tabs (Calcium carbonate) .Marland Kitchen.. 1 by mouth once daily 5)  Centrum Silver Tabs (Multiple vitamins-minerals) .... By mouth once daily 6)  Fish Oil 1000 Mg Caps (Omega-3 fatty acids) .... 2 by mouth two times a day 7)  Zostavax 19147 Unt/0.51ml Solr (Zoster vaccine live) .Marland Kitchen.. 1 ml  im x1 Prescriptions: ZOCOR 40 MG TABS (SIMVASTATIN) take one tablet at bedtime. NEEDS LABWORK.  #90 x 3   Entered and Authorized by:   Loreen Freud DO   Signed by:   Loreen Freud DO on 11/07/2010   Method used:   Print then Give to Patient   RxID:   [id number] ZOSTAVAX 96295 UNT/0.65ML SOLR (ZOSTER VACCINE LIVE) 1 ml  Im x1  #1 x 0   Entered and Authorized by:   Loreen Freud DO   Signed by:   Loreen Freud DO on 11/07/2010   Method used:   Print then Give to Patient   RxID:   [id number]    Orders Added: 1)  Venipuncture [66440] 2)  TLB-Lipid Panel [80061-LIPID] 3)  TLB-BMP (Basic Metabolic Panel-BMET) [80048-METABOL] 4)  TLB-CBC Platelet - w/Differential [85025-CBCD] 5)  TLB-Hepatic/Liver Function Pnl [80076-HEPATIC] 6)  TLB-TSH (Thyroid Stimulating Hormone) [84443-TSH] 7)  T-Vitamin D (25-Hydroxy) [34742-59563] 8)  Specimen Handling [99000] 9)  Medicare -1st Annual Wellness Visit [G0438] 10)  Est. Patient Level III [87564]    Last Flu Vaccine:  Fluvax 3+ (08/04/2010 10:38:40 AM) Flu Vaccine Result Date:  08/04/2010 Flu Vaccine Result:  given Flu Vaccine Next Due:  1 yr Colonoscopy Next Due:  Refused Mammogram Result Date:  01/04/2010 Mammogram  Result:  normal Mammogram Next Due:  1 yr Bone Density Result Date:  01/05/2009 Bone Density Result:  per gyn Bone Density Next Due: 2 yr  Appended Document: Orders Update    Clinical Lists Changes  Orders: Added new Test order of T- * Misc. Laboratory test [phone]) - Signed      Appended Document: Orders Update    Clinical Lists Changes  Observations: Added new observation of PH URINE: 6.0  (11/07/2010 16:31) Added new observation of SPEC GR URIN: <1.005  (11/07/2010 16:31) Added new observation of APPEARANCE U: Clear  (11/07/2010 16:31) Added new observation of UA COLOR: yellow  (11/07/2010 16:31) Added new observation of WBC DIPSTK U: negative  (11/07/2010 16:31) Added new observation of NITRITE URN: negative  (11/07/2010 16:31) Added new observation of UROBILINOGEN: 0.2  (11/07/2010 16:31) Added new observation of PROTEIN, URN: negative  (11/07/2010 16:31) Added new observation of BLOOD UR DIP: negative  (11/07/2010 16:31) Added new observation of KETONES URN: negative  (11/07/2010 16:31) Added new observation of BILIRUBIN UR: negative  (11/07/2010 16:31) Added new observation of GLUCOSE, URN: negative  (11/07/2010 16:31)      Laboratory Results   Urine Tests    Routine Urinalysis   Color: yellow Appearance: Clear Glucose: negative   (Normal Range: Negative) Bilirubin: negative   (Normal Range: Negative) Ketone: negative   (Normal Range: Negative) Spec. Gravity: <1.005   (Normal Range: 1.003-1.035) Blood: negative   (  Normal Range: Negative) pH: 6.0   (Normal Range: 5.0-8.0) Protein: negative   (Normal Range: Negative) Urobilinogen: 0.2   (Normal Range: 0-1) Nitrite: negative   (Normal Range: Negative) Leukocyte Esterace: negative   (Normal Range: Negative)       Appended Document: medicare cpx/cbs     Vital Signs:  Patient profile:   77 year old female Height:      62 inches Weight:      137.0 pounds BMI:     25.15  Allergies: No  Known Drug Allergies   Complete Medication List: 1)  Zocor 40 Mg Tabs (Simvastatin) .... Take one tablet at bedtime. needs labwork. 2)  Adult Aspirin Low Strength 81 Mg Tbdp (Aspirin) .Marland Kitchen.. 1 once daily 3)  Ranitidine Hcl 150 Mg Caps (Ranitidine hcl) .... Otc as needed 4)  Calcium 600 600 Mg Tabs (Calcium carbonate) .Marland Kitchen.. 1 by mouth once daily 5)  Centrum Silver Tabs (Multiple vitamins-minerals) .... By mouth once daily 6)  Fish Oil 1000 Mg Caps (Omega-3 fatty acids) .... 2 by mouth two times a day 7)  Zostavax 16109 Unt/0.43ml Solr (Zoster vaccine live) .Marland Kitchen.. 1 ml  im x1

## 2011-01-11 ENCOUNTER — Other Ambulatory Visit (HOSPITAL_COMMUNITY): Payer: Self-pay | Admitting: Oncology

## 2011-01-11 ENCOUNTER — Encounter (HOSPITAL_BASED_OUTPATIENT_CLINIC_OR_DEPARTMENT_OTHER): Payer: Medicare Other | Admitting: Oncology

## 2011-01-11 DIAGNOSIS — C9110 Chronic lymphocytic leukemia of B-cell type not having achieved remission: Secondary | ICD-10-CM

## 2011-01-11 LAB — CBC WITH DIFFERENTIAL/PLATELET
BASO%: 0.3 % (ref 0.0–2.0)
EOS%: 1.8 % (ref 0.0–7.0)
HCT: 39.1 % (ref 34.8–46.6)
LYMPH%: 68.4 % — ABNORMAL HIGH (ref 14.0–49.7)
MCH: 31.5 pg (ref 25.1–34.0)
MCHC: 33.8 g/dL (ref 31.5–36.0)
MCV: 93.4 fL (ref 79.5–101.0)
MONO%: 4.7 % (ref 0.0–14.0)
NEUT%: 24.8 % — ABNORMAL LOW (ref 38.4–76.8)
Platelets: 206 10*3/uL (ref 145–400)
lymph#: 12.5 10*3/uL — ABNORMAL HIGH (ref 0.9–3.3)

## 2011-03-09 ENCOUNTER — Other Ambulatory Visit: Payer: Self-pay | Admitting: Family Medicine

## 2011-05-14 ENCOUNTER — Other Ambulatory Visit: Payer: Self-pay | Admitting: Family Medicine

## 2011-05-29 ENCOUNTER — Other Ambulatory Visit: Payer: Self-pay | Admitting: Family Medicine

## 2011-05-29 DIAGNOSIS — E785 Hyperlipidemia, unspecified: Secondary | ICD-10-CM

## 2011-05-30 ENCOUNTER — Other Ambulatory Visit (INDEPENDENT_AMBULATORY_CARE_PROVIDER_SITE_OTHER): Payer: Medicare Other

## 2011-05-30 DIAGNOSIS — E785 Hyperlipidemia, unspecified: Secondary | ICD-10-CM

## 2011-05-30 LAB — HEPATIC FUNCTION PANEL
ALT: 27 U/L (ref 0–35)
AST: 30 U/L (ref 0–37)
Total Bilirubin: 0.4 mg/dL (ref 0.3–1.2)

## 2011-05-30 LAB — LIPID PANEL: HDL: 66.2 mg/dL (ref >39.00)

## 2011-05-30 LAB — BASIC METABOLIC PANEL
BUN: 10 mg/dL (ref 6–23)
Calcium: 9.4 mg/dL (ref 8.4–10.5)
Creatinine, Ser: 0.6 mg/dL (ref 0.4–1.2)
GFR: 96.16 mL/min (ref >60.00)
Glucose, Bld: 73 mg/dL (ref 70–99)
Sodium: 141 mEq/L (ref 135–145)

## 2011-05-30 NOTE — Progress Notes (Signed)
Labs only

## 2011-06-16 ENCOUNTER — Other Ambulatory Visit: Payer: Self-pay | Admitting: Family Medicine

## 2011-07-13 ENCOUNTER — Encounter (HOSPITAL_BASED_OUTPATIENT_CLINIC_OR_DEPARTMENT_OTHER): Payer: Medicare Other | Admitting: Oncology

## 2011-07-13 ENCOUNTER — Other Ambulatory Visit (HOSPITAL_COMMUNITY): Payer: Self-pay | Admitting: Oncology

## 2011-07-13 DIAGNOSIS — C9111 Chronic lymphocytic leukemia of B-cell type in remission: Secondary | ICD-10-CM

## 2011-07-13 DIAGNOSIS — C9110 Chronic lymphocytic leukemia of B-cell type not having achieved remission: Secondary | ICD-10-CM

## 2011-07-13 LAB — CBC WITH DIFFERENTIAL/PLATELET
BASO%: 0.4 % (ref 0.0–2.0)
Eosinophils Absolute: 0.4 10*3/uL (ref 0.0–0.5)
LYMPH%: 73.1 % — ABNORMAL HIGH (ref 14.0–49.7)
MCHC: 33.6 g/dL (ref 31.5–36.0)
MCV: 94.8 fL (ref 79.5–101.0)
MONO%: 5.1 % (ref 0.0–14.0)
NEUT%: 19.3 % — ABNORMAL LOW (ref 38.4–76.8)
Platelets: 217 10*3/uL (ref 145–400)
RBC: 4.26 10*6/uL (ref 3.70–5.45)

## 2011-07-13 LAB — COMPREHENSIVE METABOLIC PANEL
ALT: 18 U/L (ref 0–35)
BUN: 8 mg/dL (ref 6–23)
CO2: 24 mEq/L (ref 19–32)
Creatinine, Ser: 0.6 mg/dL (ref 0.50–1.10)
Total Bilirubin: 0.3 mg/dL (ref 0.3–1.2)

## 2011-07-13 LAB — LACTATE DEHYDROGENASE: LDH: 146 U/L (ref 94–250)

## 2011-08-07 ENCOUNTER — Ambulatory Visit (INDEPENDENT_AMBULATORY_CARE_PROVIDER_SITE_OTHER): Payer: Medicare Other

## 2011-08-07 DIAGNOSIS — Z23 Encounter for immunization: Secondary | ICD-10-CM

## 2011-11-09 ENCOUNTER — Encounter: Payer: Medicare Other | Admitting: Family Medicine

## 2011-11-24 ENCOUNTER — Telehealth: Payer: Self-pay | Admitting: Oncology

## 2011-11-24 NOTE — Telephone Encounter (Signed)
Called pt,left message appt on 3/20 lab and lab MD in September 2013

## 2011-12-13 ENCOUNTER — Ambulatory Visit: Payer: Medicare Other

## 2011-12-13 ENCOUNTER — Encounter: Payer: Self-pay | Admitting: Family Medicine

## 2011-12-13 ENCOUNTER — Ambulatory Visit (INDEPENDENT_AMBULATORY_CARE_PROVIDER_SITE_OTHER): Payer: Medicare Other | Admitting: Family Medicine

## 2011-12-13 VITALS — BP 120/70 | HR 75 | Temp 98.2°F | Ht 62.0 in | Wt 143.4 lb

## 2011-12-13 DIAGNOSIS — Z0000 Encounter for general adult medical examination without abnormal findings: Secondary | ICD-10-CM

## 2011-12-13 DIAGNOSIS — D72829 Elevated white blood cell count, unspecified: Secondary | ICD-10-CM

## 2011-12-13 DIAGNOSIS — C9110 Chronic lymphocytic leukemia of B-cell type not having achieved remission: Secondary | ICD-10-CM

## 2011-12-13 DIAGNOSIS — E785 Hyperlipidemia, unspecified: Secondary | ICD-10-CM

## 2011-12-13 LAB — HEPATIC FUNCTION PANEL
Bilirubin, Direct: 0 mg/dL (ref 0.0–0.3)
Total Bilirubin: 0.3 mg/dL (ref 0.3–1.2)

## 2011-12-13 LAB — BASIC METABOLIC PANEL
BUN: 10 mg/dL (ref 6–23)
CO2: 28 mEq/L (ref 19–32)
Chloride: 105 mEq/L (ref 96–112)
Creatinine, Ser: 0.7 mg/dL (ref 0.4–1.2)
Glucose, Bld: 89 mg/dL (ref 70–99)

## 2011-12-13 LAB — LIPID PANEL
Cholesterol: 170 mg/dL (ref 0–200)
HDL: 66.7 mg/dL (ref >39.00)
LDL Cholesterol: 72 mg/dL (ref 0–99)
Total CHOL/HDL Ratio: 3
Triglycerides: 156 mg/dL — ABNORMAL HIGH (ref 0.0–149.0)

## 2011-12-13 LAB — CBC WITH DIFFERENTIAL/PLATELET
Eosinophils Absolute: 0.4 10*3/uL (ref 0.0–0.7)
HCT: 43 % (ref 36.0–46.0)
Lymphs Abs: 11.4 10*3/uL — ABNORMAL HIGH (ref 0.7–4.0)
MCHC: 33.2 g/dL (ref 30.0–36.0)
MCV: 94.3 fl (ref 78.0–100.0)
Monocytes Absolute: 0.8 10*3/uL (ref 0.1–1.0)
Neutrophils Relative %: 26.9 % — ABNORMAL LOW (ref 43.0–77.0)
Platelets: 209 10*3/uL (ref 150.0–400.0)
RDW: 13.6 % (ref 11.5–14.6)

## 2011-12-13 MED ORDER — ZOSTER VACCINE LIVE 19400 UNT/0.65ML ~~LOC~~ SOLR: 0.6500 mL | Freq: Once | SUBCUTANEOUS | Status: AC

## 2011-12-13 NOTE — Patient Instructions (Signed)

## 2011-12-13 NOTE — Progress Notes (Signed)
Subjective:    Lindsey Patel is a 78 y.o. female who presents for Medicare Annual/Subsequent preventive examination.  Preventive Screening-Counseling & Management  Tobacco History  Smoking status  . Former Smoker  . Types: Cigarettes  . Quit date: 12/13/1963  Smokeless tobacco  . Never Used     Problems Prior to Visit 1.   Current Problems (verified) Patient Active Problem List  Diagnoses  . LEUKEMIA, LYMPHOCYTIC, CHRONIC  . HYPERLIPIDEMIA  . LEUKOCYTOSIS NOS  . DISEASE, WHITE BLOOD CELL NEC  . SINUSITIS, ACUTE NOS  . URI  . OSTEOPOROSIS    Medications Prior to Visit Current Outpatient Prescriptions on File Prior to Visit  Medication Sig Dispense Refill  . simvastatin (ZOCOR) 40 MG tablet TAKE ONE TABLET BY MOUTH AT BEDTIME  30 tablet  6    Current Medications (verified) Current Outpatient Prescriptions  Medication Sig Dispense Refill  . aspirin 81 MG tablet Take 81 mg by mouth daily.      . calcium carbonate (OS-CAL) 600 MG TABS Take 600 mg by mouth 2 (two) times daily with a meal.      . fish oil-omega-3 fatty acids 1000 MG capsule Take 1 g by mouth daily.      . Flaxseed, Linseed, (FLAXSEED OIL PO) Take 1,400 mg by mouth daily.      . Multiple Vitamin (MULTIVITAMIN) tablet Take 1 tablet by mouth daily.      . ranitidine (ZANTAC) 150 MG capsule Take 150 mg by mouth 2 (two) times daily.      . simvastatin (ZOCOR) 40 MG tablet TAKE ONE TABLET BY MOUTH AT BEDTIME  30 tablet  6  . zoster vaccine live, PF, (ZOSTAVAX) 16109 UNT/0.65ML injection Inject 19,400 Units into the skin once.  1 vial  0     Allergies (verified) Review of patient's allergies indicates no known allergies.   PAST HISTORY  Family History Family History  Problem Relation Age of Onset  . Stroke    . Alzheimer's disease    . Dementia    . Parkinsonism      Social History History  Substance Use Topics  . Smoking status: Former Smoker    Types: Cigarettes    Quit date: 12/13/1963  .  Smokeless tobacco: Never Used  . Alcohol Use: No     Are there smokers in your home (other than you)? No  Risk Factors Current exercise habits: walks  Dietary issues discussed: na   Cardiac risk factors: advanced age (older than 27 for men, 74 for women) and dyslipidemia.  Depression Screen (Note: if answer to either of the following is "Yes", a more complete depression screening is indicated)   Over the past two weeks, have you felt down, depressed or hopeless? No  Over the past two weeks, have you felt little interest or pleasure in doing things? No  Have you lost interest or pleasure in daily life? No  Do you often feel hopeless? No  Do you cry easily over simple problems? No  Activities of Daily Living In your present state of health, do you have any difficulty performing the following activities?:  Driving? No Managing money?  No Feeding yourself? No Getting from bed to chair? No Climbing a flight of stairs? No Preparing food and eating?: No Bathing or showering? No Getting dressed: No Getting to the toilet? No Using the toilet:No Moving around from place to place: No In the past year have you fallen or had a near fall?:No  Are you sexually active?  No  Do you have more than one partner?  No  Hearing Difficulties: No Do you often ask people to speak up or repeat themselves? No Do you experience ringing or noises in your ears? No Do you have difficulty understanding soft or whispered voices? No   Do you feel that you have a problem with memory? No  Do you often misplace items? No  Do you feel safe at home?  Yes  Cognitive Testing  Alert? Yes  Normal Appearance?Yes  Oriented to person? Yes  Place? Yes   Time? Yes  Recall of three objects?  Yes  Can perform simple calculations? Yes  Displays appropriate judgment?Yes  Can read the correct time from a watch face?Yes   Advanced Directives have been discussed with the patient? Yes  List the Names of Other  Physician/Practitioners you currently use: 1.  opth--[facility] 2 gyn--Dr white 3 onc-- Dr Arline Asp 4derm--[facility] 5. Dentist-- Terance Ice any recent Medical Services you may have received from other than [facility] providers in the past year (date may be approximate).  Immunization History  Administered Date(s) Administered  . Influenza Split 08/07/2011  . Influenza Whole 09/03/2007, 08/04/2008, 08/03/2009, 08/04/2010  . Pneumococcal Polysaccharide 10/04/2004  . Td 10/03/2005    Screening Tests Health Maintenance  Topic Date Due  . Zostavax  06/01/1989  . Mammogram  01/05/2011  . Influenza Vaccine  07/22/2012  . Tetanus/tdap  10/04/2015  . Colonoscopy  12/12/2021  . Pneumococcal Polysaccharide Vaccine Age 52 And Over  Completed    All answers were reviewed with the patient and necessary referrals were made:  Loreen Freud, DO   12/13/2011   History reviewed: allergies, current medications, past family history, past medical history, past social history, past surgical history and problem list  Review of Systems  Review of Systems  Constitutional: Negative for activity change, appetite change and fatigue.  HENT: Negative for hearing loss, congestion, tinnitus and ear discharge.   Eyes: Negative for visual disturbance (see optho q1y -- vision corrected to 20/20 with glasses).  Respiratory: Negative for cough, chest tightness and shortness of breath.   Cardiovascular: Negative for chest pain, palpitations and leg swelling.  Gastrointestinal: Negative for abdominal pain, diarrhea, constipation and abdominal distention.  Genitourinary: Negative for urgency, frequency, decreased urine volume and difficulty urinating.  Musculoskeletal: Negative for back pain, arthralgias and gait problem.  Skin: Negative for color change, pallor and rash.  Neurological: Negative for dizziness, light-headedness, numbness and headaches.  Hematological: Negative for adenopathy. Does not bruise/bleed  easily.  Psychiatric/Behavioral: Negative for suicidal ideas, confusion, sleep disturbance, self-injury, dysphoric mood, decreased concentration and agitation.  Pt is able to read and write and can do all ADLs No risk for falling No abuse/ violence in home      Objective:     Vision by Snellen chart: opth  Body mass index is 26.23 kg/(m^2). BP 120/70  Pulse 75  Temp(Src) 98.2 F (36.8 C) (Oral)  Ht 5\' 2"  (1.575 m)  Wt 143 lb 6.4 oz (65.046 kg)  BMI 26.23 kg/m2  SpO2 96%  BP 120/70  Pulse 75  Temp(Src) 98.2 F (36.8 C) (Oral)  Ht 5\' 2"  (1.575 m)  Wt 143 lb 6.4 oz (65.046 kg)  BMI 26.23 kg/m2  SpO2 96% General appearance: alert, cooperative, appears stated age and no distress Head: Normocephalic, without obvious abnormality, atraumatic Eyes: negative findings: lids and lashes normal, conjunctivae and sclerae normal and pupils equal, round, reactive to light and  accomodation Ears: normal TM's and external ear canals both ears Nose: Nares normal. Septum midline. Mucosa normal. No drainage or sinus tenderness. Throat: lips, mucosa, and tongue normal; teeth and gums normal Neck: no adenopathy, no carotid bruit, no JVD, supple, symmetrical, trachea midline and thyroid not enlarged, symmetric, no tenderness/mass/nodules Back: symmetric, no curvature. ROM normal. No CVA tenderness. Lungs: clear to auscultation bilaterally Breasts: gyn Heart: S1, S2 normal Abdomen: soft, non-tender; bowel sounds normal; no masses,  no organomegaly Pelvic: gyn Extremities: extremities normal, atraumatic, no cyanosis or edema Pulses: 2+ and symmetric Skin: Skin color, texture, turgor normal. No rashes or lesions Lymph nodes: Cervical, supraclavicular, and axillary nodes normal. Neurologic: Alert and oriented X 3, normal strength and tone. Normal symmetric reflexes. Normal coordination and gait Psych--no depression, no anxiety     Assessment:     cpe     Plan:     During the course of  the visit the patient was educated and counseled about appropriate screening and preventive services including:    Pneumococcal vaccine   Influenza vaccine  Td vaccine  Screening mammography  Screening Pap smear and pelvic exam   Bone densitometry screening  Colorectal cancer screening  Glaucoma screening  Advanced directives: has an advanced directive - a copy HAS NOT been provided.  Diet review for nutrition referral? Yes ____  Not Indicated x____   Patient Instructions (the written plan) was given to the patient.  Medicare Attestation I have personally reviewed: The patient's medical and social history Their use of alcohol, tobacco or illicit drugs Their current medications and supplements The patient's functional ability including ADLs,fall risks, home safety risks, cognitive, and hearing and visual impairment Diet and physical activities Evidence for depression or mood disorders  The patient's weight, height, BMI, and visual acuity have been recorded in the chart.  I have made referrals, counseling, and provided education to the patient based on review of the above and I have provided the patient with a written personalized care plan for preventive services.     Loreen Freud, DO   12/13/2011

## 2011-12-14 LAB — PATHOLOGIST SMEAR REVIEW

## 2011-12-17 LAB — POCT URINALYSIS DIPSTICK
Bilirubin, UA: NEGATIVE
Blood, UA: NEGATIVE
Nitrite, UA: NEGATIVE
Spec Grav, UA: <=1.005
Urobilinogen, UA: 0.2
pH, UA: 8

## 2012-01-07 ENCOUNTER — Ambulatory Visit: Payer: Medicare Other | Admitting: Oncology

## 2012-01-07 ENCOUNTER — Other Ambulatory Visit: Payer: Medicare Other

## 2012-01-09 ENCOUNTER — Other Ambulatory Visit (HOSPITAL_BASED_OUTPATIENT_CLINIC_OR_DEPARTMENT_OTHER): Payer: Medicare Other | Admitting: Lab

## 2012-01-09 DIAGNOSIS — C9110 Chronic lymphocytic leukemia of B-cell type not having achieved remission: Secondary | ICD-10-CM

## 2012-01-09 LAB — CBC WITH DIFFERENTIAL/PLATELET
BASO%: 0.4 % (ref 0.0–2.0)
LYMPH%: 69.6 % — ABNORMAL HIGH (ref 14.0–49.7)
MCHC: 33.1 g/dL (ref 31.5–36.0)
MONO#: 0.7 10*3/uL (ref 0.1–0.9)
Platelets: 208 10*3/uL (ref 145–400)
RBC: 4.34 10*6/uL (ref 3.70–5.45)
RDW: 13 % (ref 11.2–14.5)
WBC: 17.3 10*3/uL — ABNORMAL HIGH (ref 3.9–10.3)

## 2012-01-20 ENCOUNTER — Other Ambulatory Visit: Payer: Self-pay | Admitting: Family Medicine

## 2012-05-12 ENCOUNTER — Encounter: Payer: Self-pay | Admitting: Family Medicine

## 2012-05-12 ENCOUNTER — Ambulatory Visit (INDEPENDENT_AMBULATORY_CARE_PROVIDER_SITE_OTHER): Payer: Medicare Other | Admitting: Family Medicine

## 2012-05-12 VITALS — BP 116/68 | HR 87 | Temp 98.3°F | Wt 141.2 lb

## 2012-05-12 DIAGNOSIS — E785 Hyperlipidemia, unspecified: Secondary | ICD-10-CM

## 2012-05-12 LAB — HEPATIC FUNCTION PANEL
Alkaline Phosphatase: 58 U/L (ref 39–117)
Bilirubin, Direct: 0 mg/dL (ref 0.0–0.3)
Total Protein: 6.4 g/dL (ref 6.0–8.3)

## 2012-05-12 LAB — LIPID PANEL
HDL: 68.3 mg/dL (ref >39.00)
Total CHOL/HDL Ratio: 2
VLDL: 18 mg/dL (ref 0.0–40.0)

## 2012-05-12 LAB — BASIC METABOLIC PANEL
CO2: 28 mEq/L (ref 19–32)
Calcium: 9.6 mg/dL (ref 8.4–10.5)
GFR: 92.53 mL/min (ref >60.00)
Sodium: 139 mEq/L (ref 135–145)

## 2012-05-12 NOTE — Patient Instructions (Addendum)

## 2012-05-12 NOTE — Progress Notes (Signed)
  Subjective:    Lindsey Patel is a 78 y.o. female here for follow up of dyslipidemia. The patient does not use medications that may worsen dyslipidemias (corticosteroids, progestins, anabolic steroids, diuretics, beta-blockers, amiodarone, cyclosporine, olanzapine). The patient exercises occasionally.  Cardiac Risk Factors Age > 45-female, > 55-female:  YES  +1  Smoking:   NO  Sig. family hx of CHD*:  NO  Hypertension:   YES  +1  Diabetes:   NO  HDL < 35:   NO  HDL > 59:   YES  -1  Total: 1   *- Sig. family h/o CHD per NCEP = MI or sudden death at <55yo in  father or other 1st-degree female relative, or <65yo in mother or  other 1st-degree female relative  The following portions of the patient's history were reviewed and updated as appropriate: allergies, current medications, past family history, past medical history, past social history, past surgical history and problem list.  Review of Systems Pertinent items are noted in HPI.    Objective:    BP 116/68  Pulse 87  Temp 98.3 F (36.8 C) (Oral)  Wt 141 lb 3.2 oz (64.048 kg)  SpO2 96% General appearance: alert, cooperative, appears stated age and no distress Lungs: clear to auscultation bilaterally Heart: S1, S2 normal Extremities: extremities normal, atraumatic, no cyanosis or edema  Lab Review Lab Results  Component Value Date   CHOL 170 12/13/2011   CHOL 173 05/30/2011   CHOL 179 11/07/2010   HDL 66.70 12/13/2011   HDL 40.98 05/30/2011   HDL 65.90 11/07/2010   LDLDIRECT 93.9 12/08/2008      Assessment:    Dyslipidemia as detailed above with 1 CHD risk factors using NCEP scheme above.  Target levels for LDL are: < 100 mg/dl (CHD or "CHD risk equivalent" is present)  Explained to the patient the respective contributions of genetics, diet, and exercise to lipid levels and the use of medication in severe cases which do not respond to lifestyle alteration. The patient's interest and motivation in making lifestyle changes  seems excellent.    Plan:    The following changes are planned for the next 6 months, at which time the patient will return for repeat fasting lipids:  1. Dietary changes: Increase soluble fiber Plant sterols 2grams per day (e.g. Benecol): yes Reduce saturated fat, "trans" monounsaturated fatty acids, and cholesterol 2. Exercise changes:  increase exercise--d/w pt 3. Other treatment: None 4. Lipid-lowering medications: con't current meds  (Recommended by NCEP after 3-6 mos of dietary therapy & lifestyle modification,  except if CHD is present or LDL well above 190.) 5. Hormone replacement therapy (patient is a postmenopausal  woman): no 6. Screening for secondary causes of dyslipidemias: None indicated 7. Lipid screening for relatives: na 8. Follow up: 6 months -.  Note: The majority of the visit was spent in counseling on the pathophysiology and treatment of dyslipidemias. The total face-to-face time was in excess of 20 minutes.

## 2012-07-10 ENCOUNTER — Other Ambulatory Visit: Payer: Self-pay

## 2012-07-10 DIAGNOSIS — C9110 Chronic lymphocytic leukemia of B-cell type not having achieved remission: Secondary | ICD-10-CM

## 2012-07-11 ENCOUNTER — Ambulatory Visit (HOSPITAL_BASED_OUTPATIENT_CLINIC_OR_DEPARTMENT_OTHER): Payer: Medicare Other | Admitting: Oncology

## 2012-07-11 ENCOUNTER — Telehealth: Payer: Self-pay | Admitting: Oncology

## 2012-07-11 ENCOUNTER — Encounter: Payer: Self-pay | Admitting: Oncology

## 2012-07-11 ENCOUNTER — Other Ambulatory Visit (HOSPITAL_BASED_OUTPATIENT_CLINIC_OR_DEPARTMENT_OTHER): Payer: Medicare Other | Admitting: Lab

## 2012-07-11 VITALS — BP 177/87 | HR 90 | Temp 97.8°F | Resp 20 | Ht 62.0 in | Wt 141.0 lb

## 2012-07-11 DIAGNOSIS — C9111 Chronic lymphocytic leukemia of B-cell type in remission: Secondary | ICD-10-CM

## 2012-07-11 DIAGNOSIS — C9110 Chronic lymphocytic leukemia of B-cell type not having achieved remission: Secondary | ICD-10-CM

## 2012-07-11 LAB — CBC WITH DIFFERENTIAL/PLATELET
BASO%: 0.5 % (ref 0.0–2.0)
MCHC: 32.5 g/dL (ref 31.5–36.0)
MONO#: 0.9 10*3/uL (ref 0.1–0.9)
RBC: 4.32 10*6/uL (ref 3.70–5.45)
RDW: 13.7 % (ref 11.2–14.5)
WBC: 20.1 10*3/uL — ABNORMAL HIGH (ref 3.9–10.3)
lymph#: 14.8 10*3/uL — ABNORMAL HIGH (ref 0.9–3.3)

## 2012-07-11 LAB — COMPREHENSIVE METABOLIC PANEL (CC13)
ALT: 18 U/L (ref 0–55)
AST: 25 U/L (ref 5–34)
Alkaline Phosphatase: 71 U/L (ref 40–150)
Calcium: 9.8 mg/dL (ref 8.4–10.4)
Chloride: 104 mEq/L (ref 98–107)
Creatinine: 0.7 mg/dL (ref 0.6–1.1)
Potassium: 4.9 mEq/L (ref 3.5–5.1)

## 2012-07-11 LAB — LACTATE DEHYDROGENASE (CC13): LDH: 174 U/L (ref 125–220)

## 2012-07-11 NOTE — Progress Notes (Signed)
This office note has been dictated.  #[id number]

## 2012-07-11 NOTE — Progress Notes (Signed)
CC:   Lelon Perla, DO  PROBLEM LIST: 1. Chronic lymphocytic leukemia, stage 0, with diagnosis going back to     September 2006.  Diagnostic flow cytometry on the peripheral blood     was carried out on 08/19/2007.  The patient has not had a bone     marrow nor has she required any treatment. 2. Dyslipidemia. 3. 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  MEDICATIONS: 1. Aspirin 81 mg daily. 2. Calcium carbonate 600 mg twice daily. 3. Fish oil-omega-3 fatty acids 1000 mg daily. 4. Flaxseed oil 1400 mg daily. 5. Multivitamins 1 tablet daily. 6. Zantac 150 mg twice daily as needed. 7. Zocor 40 mg at bedtime.  SMOKING HISTORY:  The patient smoked less than half a pack of cigarettes a day for 4 years.  She has not smoked since 1961.  HISTORY:  Lindsey Patel was seen today for followup of her stage 0 chronic lymphocytic leukemia with diagnosis going back to September 2006.  The patient was last seen by [place] on 07/13/2011.  We check CBCs every 6 months and have been seeing her yearly.  The patient continues to do extraordinarily well.  She recently celebrated her 85rd birthday. She lives alone, is fully independent, drives.  She denies any fever, chills, night sweats, any sense of ill health.  She remains fully active and independent.  She had Pneumovax in the past.  She is due for her yearly flu shot which she will obtain through Dr. Ernst Spell office.  PHYSICAL EXAMINATION:  The patient is a spry 78 year old woman who looks well.  Weight is 141 pounds.  Height 5 feet 2 inches, body surface area 1.67 m2.  Blood pressure today 177/87.  Other vital signs are normal. There is no scleral icterus.  Mouth and pharynx are benign.  There is no peripheral adenopathy palpable in the neck, supraclavicular, axillary or inguinal areas.  Heart and lungs were normal.  Breasts were not examined.  The patient has yearly mammograms from her gynecologist  in [facility].  Abdomen is slightly obese, nontender with no organomegaly or masses palpable.  Extremities:  No peripheral edema or clubbing. Neurologic exam is nonfocal.  She wears hearing aids bilaterally.  She has a multitude of seborrheic keratoses.  LABORATORY DATA:  Today, white count 20.1, ANC 4.1, hemoglobin 13.4, hematocrit 41.3, platelets 192,000.  There are 20% neutrophils, 73% lymphs.  Absolute lymphocyte count is 14.8, which is generally stable. Chemistries are normal except for slightly low protein of 6.2.  IMAGING STUDIES:  No recent data is available.  IMPRESSION AND PLAN:  Lindsey Patel continues to do well with no evidence for progression, now out 7 years from the time of diagnosis.  She has never been treated.  She would appear to have an excellent prognosis.  In 6 months we will check CBC and quantitative immunoglobulins.  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  We will plan to see Lindsey Patel again in 1 year at which time we will check CBC and chemistries.  The patient said that she will obtain a flu shot from Dr. Ernst Spell office. She apparently had mammograms carried out in May of this year.    ______________________________ Samul Dada, M.D. DSM/MEDQ  D:  07/11/2012  T:  07/11/2012  Job:  [id number]

## 2012-07-11 NOTE — Telephone Encounter (Signed)
Gave pt appt for march and September 2014 lab and MD

## 2012-07-15 ENCOUNTER — Telehealth: Payer: Self-pay | Admitting: Family Medicine

## 2012-07-15 NOTE — Telephone Encounter (Signed)
Patient would like to know if she have had the pneumonia shot.

## 2012-07-15 NOTE — Telephone Encounter (Signed)
Discussed with patient and she voiced understanding- per Dr.Lowne since she has Leukemia she can have it done every 5 year. Apt scheduled when flu shot scheduled    KP

## 2012-07-16 ENCOUNTER — Other Ambulatory Visit: Payer: Self-pay | Admitting: Family Medicine

## 2012-07-16 NOTE — Telephone Encounter (Signed)
Rx sent.    MW 

## 2012-07-21 ENCOUNTER — Ambulatory Visit (INDEPENDENT_AMBULATORY_CARE_PROVIDER_SITE_OTHER): Payer: Medicare Other

## 2012-07-21 DIAGNOSIS — Z23 Encounter for immunization: Secondary | ICD-10-CM

## 2012-07-24 ENCOUNTER — Ambulatory Visit (INDEPENDENT_AMBULATORY_CARE_PROVIDER_SITE_OTHER): Payer: Medicare Other | Admitting: Internal Medicine

## 2012-07-24 DIAGNOSIS — L0390 Cellulitis, unspecified: Secondary | ICD-10-CM

## 2012-07-24 DIAGNOSIS — L0291 Cutaneous abscess, unspecified: Secondary | ICD-10-CM

## 2012-07-24 MED ORDER — CEPHALEXIN 500 MG PO CAPS: 500.0000 mg | ORAL_CAPSULE | Freq: Two times a day (BID) | ORAL | Status: DC

## 2012-07-24 NOTE — Progress Notes (Signed)
  Subjective:    Patient ID: Lindsey Patel, female    DOB: Jun 10, 1927, 78 y.o.   MRN: [id number]  HPI She received pneumonia vaccine as well as flu vaccines on 07/21/12. Her physician was concerned about potential risk of pneumonia as she has CLL. The pneumonia vaccine was administered into the right upper extremity and the flu to the left.  She began to note some soreness on 10/1 associated with some warmth. Today a second area of erythema appeared over the right biceps area. She denies itching at the sites.   By history she had no reaction to pneumonia vaccine in 2006.    Review of Systems She states she does not feel ill. She denies fever, chills, or sweats.       Objective:   Physical Exam   She appears healthy and well-nourished; she appears younger than stated age.  She has no lymphadenopathy about the neck or axilla.  Chest is clear with no increased work of breathing  She has a regular rhythm with no significant murmurs or gallops.  There is an area of erythema 9 x 8 cm over the right lateral upper arm. Anterior to this is a 2.5 x 5 cm area of erythema. These are warm but nontender         Assessment & Plan:  #1 erythema at the site of pneumonia vaccine administration associated with a second area of erythema medial to this, but not contiguous. The differential would be allergic reaction versus low-grade cellulitis. The absence of pruritus makes allergic reaction less likely.  Plan: See orders and recommendations

## 2012-07-24 NOTE — Patient Instructions (Addendum)
The lesion should be monitored for change in size or color or if new symptoms appear such as tenderness.Please report Warning Signs as discussed (pain , increasing redness,fever).  Use warm moist compresses to 3 times a day to the affected area.

## 2012-10-03 ENCOUNTER — Other Ambulatory Visit: Payer: Self-pay | Admitting: Family Medicine

## 2012-10-03 ENCOUNTER — Telehealth: Payer: Self-pay | Admitting: Family Medicine

## 2012-10-03 DIAGNOSIS — H9190 Unspecified hearing loss, unspecified ear: Secondary | ICD-10-CM

## 2012-10-03 NOTE — Telephone Encounter (Signed)
Please advise      KP 

## 2012-10-03 NOTE — Telephone Encounter (Signed)
Referral put in.

## 2012-10-03 NOTE — Telephone Encounter (Signed)
Patient needs referral to the hearing clinic. Dr. Mauricio Po ph # [phone], fx # [phone]

## 2012-11-13 ENCOUNTER — Ambulatory Visit: Payer: Medicare Other | Admitting: Family Medicine

## 2012-12-18 ENCOUNTER — Encounter: Payer: Self-pay | Admitting: Family Medicine

## 2012-12-18 ENCOUNTER — Ambulatory Visit (INDEPENDENT_AMBULATORY_CARE_PROVIDER_SITE_OTHER): Payer: Medicare Other | Admitting: Family Medicine

## 2012-12-18 VITALS — BP 116/74 | HR 67 | Temp 98.1°F | Wt 139.4 lb

## 2012-12-18 DIAGNOSIS — E785 Hyperlipidemia, unspecified: Secondary | ICD-10-CM

## 2012-12-18 LAB — LIPID PANEL
Cholesterol: 159 mg/dL (ref 0–200)
HDL: 58.8 mg/dL (ref >39.00)
VLDL: 25.8 mg/dL (ref 0.0–40.0)

## 2012-12-18 LAB — BASIC METABOLIC PANEL
BUN: 10 mg/dL (ref 6–23)
Calcium: 9.4 mg/dL (ref 8.4–10.5)
GFR: 107.52 mL/min (ref >60.00)
Glucose, Bld: 86 mg/dL (ref 70–99)
Sodium: 135 mEq/L (ref 135–145)

## 2012-12-18 LAB — HEPATIC FUNCTION PANEL: Albumin: 3.9 g/dL (ref 3.5–5.2)

## 2012-12-18 NOTE — Patient Instructions (Addendum)

## 2012-12-18 NOTE — Progress Notes (Signed)
  Subjective:    Lindsey Patel is a 79 y.o. female here for follow up of dyslipidemia. The patient does not use medications that may worsen dyslipidemias (corticosteroids, progestins, anabolic steroids, diuretics, beta-blockers, amiodarone, cyclosporine, olanzapine). The patient exercises infrequently. The patient is not known to have coexisting coronary artery disease.   Cardiac Risk Factors Age > 45-female, > 55-female:  YES  +1  Smoking:   NO  Sig. family hx of CHD*:  NO  Hypertension:   NO  Diabetes:   NO  HDL < 35:   NO  HDL > 59:   YES  -1  Total: 0   *- Sig. family h/o CHD per NCEP = MI or sudden death at <55yo in  father or other 1st-degree female relative, or <65yo in mother or  other 1st-degree female relative  The following portions of the patient's history were reviewed and updated as appropriate: allergies, current medications, past family history, past medical history, past social history, past surgical history and problem list.  Review of Systems Pertinent items are noted in HPI.    Objective:    BP 116/74  Pulse 67  Temp(Src) 98.1 F (36.7 C) (Oral)  Wt 139 lb 6.4 oz (63.231 kg)  BMI 25.49 kg/m2  SpO2 96% General appearance: alert, cooperative, appears stated age and no distress Lungs: clear to auscultation bilaterally Heart: S1, S2 normal Extremities: extremities normal, atraumatic, no cyanosis or edema  Lab Review Lab Results  Component Value Date   CHOL 152 05/12/2012   CHOL 170 12/13/2011   CHOL 173 05/30/2011   HDL 68.30 05/12/2012   HDL 40.98 12/13/2011   HDL 11.91 05/30/2011   LDLDIRECT 93.9 12/08/2008      Assessment:    Dyslipidemia as detailed above with 0 CHD risk factors using NCEP scheme above.  Target levels for LDL are: < 100 mg/dl (CHD or "CHD risk equivalent" is present)  Explained to the patient the respective contributions of genetics, diet, and exercise to lipid levels and the use of medication in severe cases which do not respond to  lifestyle alteration. The patient's interest and motivation in making lifestyle changes seems good.    Plan:    The following changes are planned for the next 6 months, at which time the patient will return for repeat fasting lipids:  1. Dietary changes: Reduce saturated fat, "trans" monounsaturated fatty acids, and cholesterol 2. Exercise changes:  advised to exercise 3. Other treatment: None 4. Lipid-lowering medications: simvastatin  (Recommended by NCEP after 3-6 mos of dietary therapy & lifestyle modification,  except if CHD is present or LDL well above 190.) 5. Hormone replacement therapy (patient is a postmenopausal  woman): no 6. Screening for secondary causes of dyslipidemias: None indicated 7. Lipid screening for relatives: na 8. Follow up: 6 months.  Note: The majority of the visit was spent in counseling on the pathophysiology and treatment of dyslipidemias. The total face-to-face time was in excess of 15 minutes.

## 2013-01-08 ENCOUNTER — Other Ambulatory Visit (HOSPITAL_BASED_OUTPATIENT_CLINIC_OR_DEPARTMENT_OTHER): Payer: Medicare PPO | Admitting: Lab

## 2013-01-08 DIAGNOSIS — C9110 Chronic lymphocytic leukemia of B-cell type not having achieved remission: Secondary | ICD-10-CM

## 2013-01-08 LAB — CBC WITH DIFFERENTIAL/PLATELET
BASO%: 0.3 % (ref 0.0–2.0)
LYMPH%: 74.3 % — ABNORMAL HIGH (ref 14.0–49.7)
MCH: 30.8 pg (ref 25.1–34.0)
MCHC: 33 g/dL (ref 31.5–36.0)
MCV: 93.4 fL (ref 79.5–101.0)
MONO%: 4.6 % (ref 0.0–14.0)
Platelets: 219 10*3/uL (ref 145–400)
RBC: 4.53 10*6/uL (ref 3.70–5.45)

## 2013-01-09 LAB — IGG, IGA, IGM: IgG (Immunoglobin G), Serum: 477 mg/dL — ABNORMAL LOW (ref 690–1700)

## 2013-01-10 ENCOUNTER — Other Ambulatory Visit: Payer: Self-pay | Admitting: Family Medicine

## 2013-06-15 ENCOUNTER — Other Ambulatory Visit: Payer: Self-pay | Admitting: Family Medicine

## 2013-06-26 ENCOUNTER — Ambulatory Visit: Payer: Medicare PPO | Admitting: Family Medicine

## 2013-07-09 ENCOUNTER — Other Ambulatory Visit: Payer: Self-pay

## 2013-07-09 DIAGNOSIS — C9110 Chronic lymphocytic leukemia of B-cell type not having achieved remission: Secondary | ICD-10-CM

## 2013-07-09 DIAGNOSIS — D72829 Elevated white blood cell count, unspecified: Secondary | ICD-10-CM

## 2013-07-10 ENCOUNTER — Other Ambulatory Visit (HOSPITAL_BASED_OUTPATIENT_CLINIC_OR_DEPARTMENT_OTHER): Payer: Medicare PPO | Admitting: Lab

## 2013-07-10 ENCOUNTER — Ambulatory Visit (HOSPITAL_BASED_OUTPATIENT_CLINIC_OR_DEPARTMENT_OTHER): Payer: Medicare PPO | Admitting: Internal Medicine

## 2013-07-10 VITALS — BP 178/72 | HR 87 | Temp 98.2°F | Resp 20 | Ht 62.0 in | Wt 140.3 lb

## 2013-07-10 DIAGNOSIS — C9110 Chronic lymphocytic leukemia of B-cell type not having achieved remission: Secondary | ICD-10-CM

## 2013-07-10 DIAGNOSIS — D72829 Elevated white blood cell count, unspecified: Secondary | ICD-10-CM

## 2013-07-10 LAB — CBC WITH DIFFERENTIAL/PLATELET
BASO%: 0.2 % (ref 0.0–2.0)
Basophils Absolute: 0.1 10*3/uL (ref 0.0–0.1)
EOS%: 1.4 % (ref 0.0–7.0)
HGB: 14.1 g/dL (ref 11.6–15.9)
MCH: 31.5 pg (ref 25.1–34.0)
RBC: 4.48 10*6/uL (ref 3.70–5.45)
RDW: 13.3 % (ref 11.2–14.5)
lymph#: 16.8 10*3/uL — ABNORMAL HIGH (ref 0.9–3.3)

## 2013-07-10 LAB — COMPREHENSIVE METABOLIC PANEL (CC13)
ALT: 17 U/L (ref 0–55)
AST: 24 U/L (ref 5–34)
Albumin: 3.6 g/dL (ref 3.5–5.0)
BUN: 7.6 mg/dL (ref 7.0–26.0)
Calcium: 9.6 mg/dL (ref 8.4–10.4)
Chloride: 103 mEq/L (ref 98–109)
Potassium: 4.3 mEq/L (ref 3.5–5.1)
Sodium: 138 mEq/L (ref 136–145)
Total Protein: 6.6 g/dL (ref 6.4–8.3)

## 2013-07-10 NOTE — Progress Notes (Signed)
[facility] OFFICE PROGRESS NOTE  Lindsey Patel, [place] [address] [place] [address]  DIAGNOSIS: LEUKEMIA, LYMPHOCYTIC, CHRONIC - Plan: CBC with Differential, CBC with Differential, Comprehensive metabolic panel  No chief complaint on file.   CURRENT THERAPY:  INTERVAL HISTORY: Lindsey Patel 79 y.o. female with a history of Chronic lymphocytic leukemia, stage 0, with diagnosis going back to  September 2006. Diagnostic flow cytometry on the peripheral blood  was carried out on 08/19/2007. The patient has not had a bone  marrow nor has she required any treatment.  She presents for follow-up today.  She was last seen by Dr. Arline Asp on 07/11/2012. She lives alone and is fully independent and drives during the day time. She denies recent hospitilizations, fevers or chills or night sweats.     MEDICAL HISTORY: Past Medical History  Diagnosis Date  . Hyperlipidemia   . Osteoporosis     INTERIM HISTORY: has LEUKEMIA, LYMPHOCYTIC, CHRONIC; HYPERLIPIDEMIA; LEUKOCYTOSIS NOS; DISEASE, WHITE BLOOD CELL NEC; SINUSITIS, ACUTE NOS; URI; and OSTEOPOROSIS on her problem list.    ALLERGIES:  is allergic to pneumococcal vaccines.  MEDICATIONS: has a current medication list which includes the following prescription(s): aspirin, calcium carbonate, fish oil-omega-3 fatty acids, flaxseed (linseed), multivitamin, simvastatin, and ranitidine.  SURGICAL HISTORY:  Past Surgical History  Procedure Laterality Date  . Tubal ligation    . Cataract extraction  6/03    left  . Cataract extraction  4/09    right    REVIEW OF SYSTEMS:   Constitutional: Denies fevers, chills or abnormal weight loss Eyes: Denies blurriness of vision Ears, nose, mouth, throat, and face: Denies mucositis or sore throat Respiratory: Denies cough, dyspnea or wheezes Cardiovascular: Denies palpitation, chest discomfort or lower extremity swelling Gastrointestinal:  Denies  nausea, heartburn or change in bowel habits Skin: Denies abnormal skin rashes Lymphatics: Denies new lymphadenopathy or easy bruising Neurological:Denies numbness, tingling or new weaknesses Behavioral/Psych: Mood is stable, no new changes  All other systems were reviewed with the patient and are negative.  PHYSICAL EXAMINATION: ECOG PERFORMANCE STATUS: 0 - Asymptomatic  Blood pressure 178/72, pulse 87, temperature 98.2 F (36.8 C), temperature source Oral, resp. rate 20, height 5\' 2"  (1.575 m), weight 140 lb 4.8 oz (63.64 kg).  GENERAL:alert, no distress and comfortable elderly female SKIN: skin color, texture, turgor are normal, no rashes; multiple seborrheic keratoses EYES: normal, Conjunctiva are pink and non-injected, sclera clear OROPHARYNX:no exudate, no erythema and lips, buccal mucosa, and tongue normal  NECK: supple, thyroid normal size, non-tender, without nodularity LYMPH:  no palpable lymphadenopathy in the cervical, axillary or inguinal LUNGS: clear to auscultation and percussion with normal breathing effort HEART: regular rate & rhythm and no murmurs and no lower extremity edema ABDOMEN:abdomen soft, non-tender and normal bowel sounds Musculoskeletal:no cyanosis of digits and no clubbing  NEURO: alert & oriented x 3 with fluent speech, no focal motor/sensory deficits   LABORATORY DATA: CBC    Component Value Date/Time   WBC 22.0* 07/10/2013 1017   WBC 17.4* 12/13/2011 1032   RBC 4.48 07/10/2013 1017   RBC 4.56 12/13/2011 1032   HGB 14.1 07/10/2013 1017   HGB 14.3 12/13/2011 1032   HCT 41.7 07/10/2013 1017   HCT 43.0 12/13/2011 1032   PLT 208 07/10/2013 1017   PLT 209.0 12/13/2011 1032   MCV 93.2 07/10/2013 1017   MCV 94.3 12/13/2011 1032   MCH 31.5 07/10/2013 1017   MCHC 33.8 07/10/2013 1017  MCHC 33.2 12/13/2011 1032   RDW 13.3 07/10/2013 1017   RDW 13.6 12/13/2011 1032   LYMPHSABS 16.8* 07/10/2013 1017   LYMPHSABS 11.4* 12/13/2011 1032   MONOABS 0.8 07/10/2013 1017    MONOABS 0.8 12/13/2011 1032   EOSABS 0.3 07/10/2013 1017   EOSABS 0.4 12/13/2011 1032   BASOSABS 0.1 07/10/2013 1017   BASOSABS 0.1 12/13/2011 1032    CMP     Component Value Date/Time   NA 138 07/10/2013 1017   NA 135 12/18/2012 1056   K 4.3 07/10/2013 1017   K 4.2 12/18/2012 1056   CL 100 12/18/2012 1056   CL 104 07/11/2012 1315   CO2 25 07/10/2013 1017   CO2 26 12/18/2012 1056   GLUCOSE 82 07/10/2013 1017   GLUCOSE 86 12/18/2012 1056   GLUCOSE 85 07/11/2012 1315   GLUCOSE 92 10/01/2006 0856   BUN 7.6 07/10/2013 1017   BUN 10 12/18/2012 1056   CREATININE 0.7 07/10/2013 1017   CREATININE 0.6 12/18/2012 1056   CALCIUM 9.6 07/10/2013 1017   CALCIUM 9.4 12/18/2012 1056   PROT 6.6 07/10/2013 1017   PROT 6.6 12/18/2012 1056   ALBUMIN 3.6 07/10/2013 1017   ALBUMIN 3.9 12/18/2012 1056   AST 24 07/10/2013 1017   AST 30 12/18/2012 1056   ALT 17 07/10/2013 1017   ALT 22 12/18/2012 1056   ALKPHOS 65 07/10/2013 1017   ALKPHOS 62 12/18/2012 1056   BILITOT 0.43 07/10/2013 1017   BILITOT 0.5 12/18/2012 1056   GFRNONAA 108.22 04/25/2010 0903   GFRAA 104 12/08/2008 0000   Results for [name], [name] (MRN [id number]) as of 07/12/2013 18:01  Ref. Range 01/08/2013 09:49  IgG (Immunoglobin G), Serum Latest Range: [phone] mg/dL 563 (L)  IgA Latest Range: 69-380 mg/dL 74   RADIOGRAPHIC STUDIES: No results found.  ASSESSMENT: CLL  PLAN:  -- She is now 8 years from the time of diagnosis and continues to do well.   -- We will check for immunoglobin and CBC in 6 months per her history of low IgG.  She has not had any infections. --RTC in 1 year with CBC, CMP  All questions were answered. The patient knows to call the clinic with any problems, questions or concerns. We can certainly see the patient much sooner if necessary.  The patient and plan discussed with [name] and he is in agreement with the aforementioned.  I spent 15 minutes counseling the patient face to face. The total time spent in the appointment was  30 minutes.    [name], MD 07/10/2013 6:00 pm

## 2013-07-10 NOTE — Patient Instructions (Addendum)
Chronic Lymphocytic Leukemia Chronic lymphocytic leukemia (CLL) is a type of cancer of the bone marrow and blood cells. Bone marrow is the soft, spongy tissue inside your bone. In CLL, the bone marrow makes too many white blood cells that usually fight infection in the body (lymphocytes). CLL is the most common type of adult leukemia.  CAUSES  No one knows the exact cause of CLL. There is a higher risk of CLL in people who:   Are over 77 years of age.  Are white.  Are female.  Have a family history of CLL or other cancers of the lymph system.  Are of [place] or [place] European Jewish descent.  Have been exposed to certain chemicals, such as Agent Orange (used in the [place] War) or other herbicides or insecticides. SYMPTOMS  At first, some people do not have symptoms of chronic lymphocytic leukemia. After a while, people may notice some symptoms, such as:   Feeling more tired than usual, even after rest.  Unplanned weight loss.  Heavy sweating at night.  Fevers.  Shortness of breath.  Decreased energy.  Paleness.  Painless, swollen lymph nodes.  A feeling of fullness in the upper left part of the abdomen.  Easy bruising and/or bleeding.  More frequent infections. DIAGNOSIS   During a physical exam, your caregiver may notice an enlarged spleen, liver and/or lymph nodes.  Blood and bone marrow tests are performed to identify the presence of cancer cells.  A CT scan may be done to look for swelling or abnormalities in your spleen, liver, and lymph nodes. TREATMENT  Treatment options for CLL depend on the stage and the presence of symptoms. There are a number of types of treatment used for this condition, including:  Targeted drugs. These are drugs that interfere with chemicals that leukemia cells need in order to grow and multiply.  Chemotherapy drugs. These medications kill cells that are multiplying quickly, such as leukemia cells.  Biological therapy. This  treatment boosts the ability of the patient's own immune system to fight the leukemia cells.  Bone marrow or peripheral blood stem cell transplant. This treatment allows the patient to receive very high doses of chemotherapy and/or radiation. These high doses kill the cancer cells, but also destroy the bone marrow. After treatment is complete, the patient is given donor bone marrow or stem cells, which will replace the bone marrow. HOME CARE INSTRUCTIONS   Because you have an increased risk of infection, practice good hand washing and avoid being around people who are ill or crowded places.  Because you have an increased risk of bleeding and bruising, avoid contact sports or other rough activities.  Only take over-the-counter or prescription medicines for pain, discomfort or fever as directed by your caregiver.  Although some of your treatments might affect your appetite, try to eat regular, healthy meals.  If you develop any side effects, such as nausea, diarrhea, rash, white patches in your mouth, a sore throat, difficulty swallowing, or severe fatigue, tell your caregiver. He or she may have recommendations of things you can do to improve symptoms.  Consider learning some ways to cope with the stress of having a chronic illness, such as yoga, meditation, or participating in a support group. SEEK IMMEDIATE MEDICAL CARE IF:  You develop an unexplained oral temperature of 102 F (38.9 C) or more.  You develop chest pains.  You develop a severe stiff neck or headache.  You have trouble breathing or feel short of breath.  You feel very lightheaded or pass out.  You notice pain, swelling or redness anywhere in your legs.  You have pain in your belly (abdomen).  You develop new bruises that are getting bigger.  You have painful or more swollen lymph nodes.  You develop bleeding from your gums, nose, or in your urine or stools.  You are unable to stop throwing up (vomiting).  You  cannot keep liquids down.  You feel depressed. Document Released: 02/24/2009 Document Revised: 12/31/2011 Document Reviewed: 02/24/2009 [facility] Patient Information 2014 [place], [place].

## 2013-07-12 ENCOUNTER — Encounter: Payer: Self-pay | Admitting: Internal Medicine

## 2013-07-14 ENCOUNTER — Telehealth: Payer: Self-pay | Admitting: Internal Medicine

## 2013-07-14 NOTE — Telephone Encounter (Signed)
s.w. pt and advised on March and Sept appts...pt ok and awre

## 2013-07-24 ENCOUNTER — Ambulatory Visit (INDEPENDENT_AMBULATORY_CARE_PROVIDER_SITE_OTHER): Payer: Medicare PPO | Admitting: Family Medicine

## 2013-07-24 ENCOUNTER — Encounter: Payer: Self-pay | Admitting: Family Medicine

## 2013-07-24 VITALS — BP 116/76 | HR 89 | Temp 98.7°F | Wt 138.8 lb

## 2013-07-24 DIAGNOSIS — N390 Urinary tract infection, site not specified: Secondary | ICD-10-CM

## 2013-07-24 DIAGNOSIS — Z23 Encounter for immunization: Secondary | ICD-10-CM

## 2013-07-24 DIAGNOSIS — E785 Hyperlipidemia, unspecified: Secondary | ICD-10-CM

## 2013-07-24 DIAGNOSIS — K219 Gastro-esophageal reflux disease without esophagitis: Secondary | ICD-10-CM

## 2013-07-24 DIAGNOSIS — R1013 Epigastric pain: Secondary | ICD-10-CM | POA: Insufficient documentation

## 2013-07-24 DIAGNOSIS — K3189 Other diseases of stomach and duodenum: Secondary | ICD-10-CM

## 2013-07-24 DIAGNOSIS — F411 Generalized anxiety disorder: Secondary | ICD-10-CM

## 2013-07-24 LAB — LIPID PANEL
Cholesterol: 153 mg/dL (ref 0–200)
HDL: 64.5 mg/dL (ref >39.00)
LDL Cholesterol: 69 mg/dL (ref 0–99)
Triglycerides: 99 mg/dL (ref 0.0–149.0)
VLDL: 19.8 mg/dL (ref 0.0–40.0)

## 2013-07-24 LAB — POCT URINALYSIS DIPSTICK
Blood, UA: NEGATIVE
Ketones, UA: NEGATIVE
Protein, UA: NEGATIVE
Spec Grav, UA: 1.01
Urobilinogen, UA: 0.2
pH, UA: 6.5

## 2013-07-24 LAB — CBC WITH DIFFERENTIAL/PLATELET
Basophils Absolute: 0 10*3/uL (ref 0.0–0.1)
Basophils Relative: 0.2 % (ref 0.0–3.0)
Eosinophils Absolute: 0.2 10*3/uL (ref 0.0–0.7)
HCT: 41 % (ref 36.0–46.0)
Hemoglobin: 13.8 g/dL (ref 12.0–15.0)
Lymphs Abs: 15.2 10*3/uL — ABNORMAL HIGH (ref 0.7–4.0)
MCHC: 33.6 g/dL (ref 30.0–36.0)
Monocytes Relative: 4.2 % (ref 3.0–12.0)
Neutro Abs: 4.7 10*3/uL (ref 1.4–7.7)
Neutrophils Relative %: 22.3 % — ABNORMAL LOW (ref 43.0–77.0)
Platelets: 224 10*3/uL (ref 150.0–400.0)
RBC: 4.39 Mil/uL (ref 3.87–5.11)
RDW: 13.6 % (ref 11.5–14.6)
WBC: 21.1 10*3/uL (ref 4.5–10.5)

## 2013-07-24 LAB — BASIC METABOLIC PANEL
BUN: 11 mg/dL (ref 6–23)
CO2: 25 mEq/L (ref 19–32)
Calcium: 9.6 mg/dL (ref 8.4–10.5)
Creatinine, Ser: 0.6 mg/dL (ref 0.4–1.2)
GFR: 105.23 mL/min (ref >60.00)
Glucose, Bld: 85 mg/dL (ref 70–99)

## 2013-07-24 LAB — HEPATIC FUNCTION PANEL
ALT: 20 U/L (ref 0–35)
Albumin: 3.9 g/dL (ref 3.5–5.2)
Total Bilirubin: 0.6 mg/dL (ref 0.3–1.2)

## 2013-07-24 MED ORDER — OMEPRAZOLE MAGNESIUM 20 MG PO TBEC: 20.0000 mg | DELAYED_RELEASE_TABLET | Freq: Every day | ORAL | Status: DC

## 2013-07-24 MED ORDER — ESOMEPRAZOLE MAGNESIUM 20 MG PO CPDR: 20.0000 mg | DELAYED_RELEASE_CAPSULE | Freq: Every day | ORAL | Status: DC

## 2013-07-24 NOTE — Assessment & Plan Note (Signed)
Pt states she just has some things to work through and she will let us know if she needs any help She was reluctant to talk about it

## 2013-07-24 NOTE — Patient Instructions (Addendum)

## 2013-07-24 NOTE — Assessment & Plan Note (Signed)
Samples of nexium otc given to pt to try qd Pt did not want to have any labs or see GI

## 2013-07-24 NOTE — Addendum Note (Signed)
Addended by: Silvio Pate D on: 07/24/2013 03:45 PM   Modules accepted: Orders

## 2013-07-24 NOTE — Progress Notes (Signed)
Subjective:    Lindsey Patel is a 79 y.o. female here for follow up of dyslipidemia. The patient does not use medications that may worsen dyslipidemias (corticosteroids, progestins, anabolic steroids, diuretics, beta-blockers, amiodarone, cyclosporine, olanzapine). The patient exercises infrequently. The patient is not known to have coexisting coronary artery disease.   Cardiac Risk Factors Age > 45-female, > 55-female:  YES  +1  Smoking:   NO  Sig. family hx of CHD*:  NO  Hypertension:   NO  Diabetes:   NO  HDL < 35:   NO  HDL > 59:   YES  -1  Total: 0   *- Sig. family h/o CHD per NCEP = MI or sudden death at <55yo in  father or other 1st-degree female relative, or <65yo in mother or  other 1st-degree female relative  The following portions of the patient's history were reviewed and updated as appropriate:  She  has a past medical history of Hyperlipidemia and Osteoporosis. She  does not have any pertinent problems on file. She  has past surgical history that includes Tubal ligation; Cataract extraction (6/03); and Cataract extraction (4/09). Her family history includes Alzheimer's disease in her sister and another family member; Cancer in her brother and father; Dementia in an other family member; Heart disease in her brother; Parkinsonism in her brother and another family member; Stroke in her mother and another family member. She  reports that she quit smoking about 49 years ago. Her smoking use included Cigarettes. She has a 2 pack-year smoking history. She has never used smokeless tobacco. She reports that she does not drink alcohol or use illicit drugs. She has a current medication list which includes the following prescription(s): aspirin, calcium carbonate, fish oil-omega-3 fatty acids, flaxseed (linseed), multivitamin, simvastatin, and omeprazole. Current Outpatient Prescriptions on File Prior to Visit  Medication Sig Dispense Refill  . aspirin 81 MG tablet Take 81 mg by mouth  daily.      . calcium carbonate (OS-CAL) 600 MG TABS Take 600 mg by mouth 2 (two) times daily with a meal.      . fish oil-omega-3 fatty acids 1000 MG capsule Take 2 g by mouth 2 (two) times daily.       . Flaxseed, Linseed, (FLAXSEED OIL PO) Take 1,400 mg by mouth daily.      . Multiple Vitamin (MULTIVITAMIN) tablet Take 1 tablet by mouth daily.      . simvastatin (ZOCOR) 40 MG tablet TAKE 1 TABLET BY MOUTH EVERY NIGHT AT BEDTIME  90 tablet  0   No current facility-administered medications on file prior to visit.   She is allergic to pneumococcal vaccines..  Review of Systems Pertinent items are noted in HPI.    Objective:    BP 116/76  Pulse 89  Temp(Src) 98.7 F (37.1 C) (Oral)  Wt 138 lb 12.8 oz (62.959 kg)  BMI 25.38 kg/m2  SpO2 97% General appearance: alert, cooperative, appears stated age and no distress Lungs: clear to auscultation bilaterally Heart: regular rate and rhythm, S1, S2 normal, no murmur, click, rub or gallop Extremities: extremities normal, atraumatic, no cyanosis or edema  Lab Review Lab Results  Component Value Date   CHOL 159 12/18/2012   CHOL 152 05/12/2012   CHOL 170 12/13/2011   HDL 58.80 12/18/2012   HDL 69.62 05/12/2012   HDL 95.28 12/13/2011   LDLDIRECT 93.9 12/08/2008      Assessment:    Dyslipidemia as detailed above with 0 CHD risk factors using  NCEP scheme above.  Target levels for LDL are: < 100 mg/dl (CHD or "CHD risk equivalent" is present)  Explained to the patient the respective contributions of genetics, diet, and exercise to lipid levels and the use of medication in severe cases which do not respond to lifestyle alteration. The patient's interest and motivation in making lifestyle changes seems excellent.    Plan:    The following changes are planned for the next 6 months, at which time the patient will return for repeat fasting lipids:  1. Dietary changes: Increase soluble fiber Plant sterols 2grams per day (e.g. Benecol):  yes 2. Exercise changes:  encouraged pt to exercise 3. Other treatment: None 4. Lipid-lowering medications: zocor  (Recommended by NCEP after 3-6 mos of dietary therapy & lifestyle modification,  except if CHD is present or LDL well above 190.) 5. Hormone replacement therapy (patient is a postmenopausal  woman): no 6. Screening for secondary causes of dyslipidemias: None indicated 7. Lipid screening for relatives: na 8. Follow up: 6 months.  Note: The majority of the visit was spent in counseling on the pathophysiology and treatment of dyslipidemias. The total face-to-face time was in excess of 25 minutes.

## 2013-07-25 LAB — URINE CULTURE: Colony Count: 10000

## 2013-09-12 ENCOUNTER — Other Ambulatory Visit: Payer: Self-pay | Admitting: Family Medicine

## 2013-12-28 ENCOUNTER — Other Ambulatory Visit (HOSPITAL_BASED_OUTPATIENT_CLINIC_OR_DEPARTMENT_OTHER): Payer: Medicare PPO

## 2013-12-28 DIAGNOSIS — C9110 Chronic lymphocytic leukemia of B-cell type not having achieved remission: Secondary | ICD-10-CM

## 2013-12-28 LAB — CBC WITH DIFFERENTIAL/PLATELET
BASO%: 0.4 % (ref 0.0–2.0)
BASOS ABS: 0.1 10*3/uL (ref 0.0–0.1)
EOS%: 1.9 % (ref 0.0–7.0)
Eosinophils Absolute: 0.3 10*3/uL (ref 0.0–0.5)
HCT: 41.8 % (ref 34.8–46.6)
HGB: 13.7 g/dL (ref 11.6–15.9)
LYMPH%: 74.5 % — AB (ref 14.0–49.7)
MCH: 30.9 pg (ref 25.1–34.0)
MCHC: 32.8 g/dL (ref 31.5–36.0)
MCV: 94.2 fL (ref 79.5–101.0)
MONO#: 0.8 10*3/uL (ref 0.1–0.9)
MONO%: 4.1 % (ref 0.0–14.0)
NEUT#: 3.5 10*3/uL (ref 1.5–6.5)
NEUT%: 19.1 % — ABNORMAL LOW (ref 38.4–76.8)
PLATELETS: 200 10*3/uL (ref 145–400)
RBC: 4.43 10*6/uL (ref 3.70–5.45)
RDW: 13.5 % (ref 11.2–14.5)
WBC: 18.5 10*3/uL — ABNORMAL HIGH (ref 3.9–10.3)
lymph#: 13.8 10*3/uL — ABNORMAL HIGH (ref 0.9–3.3)

## 2013-12-28 LAB — TECHNOLOGIST REVIEW

## 2014-01-22 ENCOUNTER — Encounter: Payer: Medicare PPO | Admitting: Family Medicine

## 2014-02-26 ENCOUNTER — Ambulatory Visit (INDEPENDENT_AMBULATORY_CARE_PROVIDER_SITE_OTHER): Payer: Medicare PPO | Admitting: Family Medicine

## 2014-02-26 ENCOUNTER — Encounter: Payer: Self-pay | Admitting: Family Medicine

## 2014-02-26 VITALS — BP 114/74 | HR 56 | Temp 99.1°F | Ht 62.0 in | Wt 139.0 lb

## 2014-02-26 DIAGNOSIS — Z0000 Encounter for general adult medical examination without abnormal findings: Secondary | ICD-10-CM

## 2014-02-26 DIAGNOSIS — J309 Allergic rhinitis, unspecified: Secondary | ICD-10-CM

## 2014-02-26 DIAGNOSIS — J302 Other seasonal allergic rhinitis: Secondary | ICD-10-CM

## 2014-02-26 DIAGNOSIS — E785 Hyperlipidemia, unspecified: Secondary | ICD-10-CM

## 2014-02-26 LAB — BASIC METABOLIC PANEL
BUN: 8 mg/dL (ref 6–23)
CALCIUM: 9.3 mg/dL (ref 8.4–10.5)
CHLORIDE: 107 meq/L (ref 96–112)
CO2: 25 meq/L (ref 19–32)
Creatinine, Ser: 0.6 mg/dL (ref 0.4–1.2)
GFR: 107.21 mL/min (ref >60.00)
GLUCOSE: 82 mg/dL (ref 70–99)
POTASSIUM: 3.7 meq/L (ref 3.5–5.1)
Sodium: 140 mEq/L (ref 135–145)

## 2014-02-26 LAB — CBC WITH DIFFERENTIAL/PLATELET
Basophils Absolute: 0 10*3/uL (ref 0.0–0.1)
Basophils Relative: 0.3 % (ref 0.0–3.0)
EOS ABS: 0.2 10*3/uL (ref 0.0–0.7)
Eosinophils Relative: 1.6 % (ref 0.0–5.0)
HEMATOCRIT: 40.9 % (ref 36.0–46.0)
HEMOGLOBIN: 13.5 g/dL (ref 12.0–15.0)
LYMPHS ABS: 11 10*3/uL — AB (ref 0.7–4.0)
Lymphocytes Relative: 70 % — ABNORMAL HIGH (ref 12.0–46.0)
MCHC: 32.9 g/dL (ref 30.0–36.0)
MCV: 94.6 fl (ref 78.0–100.0)
MONOS PCT: 4.2 % (ref 3.0–12.0)
Monocytes Absolute: 0.7 10*3/uL (ref 0.1–1.0)
NEUTROS ABS: 3.8 10*3/uL (ref 1.4–7.7)
Neutrophils Relative %: 23.9 % — ABNORMAL LOW (ref 43.0–77.0)
Platelets: 221 10*3/uL (ref 150.0–400.0)
RBC: 4.33 Mil/uL (ref 3.87–5.11)
RDW: 13.3 % (ref 11.5–15.5)
WBC: 15.8 10*3/uL — ABNORMAL HIGH (ref 4.0–10.5)

## 2014-02-26 LAB — LIPID PANEL
CHOLESTEROL: 139 mg/dL (ref 0–200)
HDL: 57.2 mg/dL (ref >39.00)
LDL Cholesterol: 60 mg/dL (ref 0–99)
Total CHOL/HDL Ratio: 2
Triglycerides: 111 mg/dL (ref 0.0–149.0)
VLDL: 22.2 mg/dL (ref 0.0–40.0)

## 2014-02-26 LAB — HEPATIC FUNCTION PANEL
ALBUMIN: 3.8 g/dL (ref 3.5–5.2)
ALK PHOS: 51 U/L (ref 39–117)
ALT: 17 U/L (ref 0–35)
AST: 26 U/L (ref 0–37)
Bilirubin, Direct: 0.1 mg/dL (ref 0.0–0.3)
Total Bilirubin: 0.7 mg/dL (ref 0.2–1.2)
Total Protein: 6.1 g/dL (ref 6.0–8.3)

## 2014-02-26 MED ORDER — TRIAMCINOLONE ACETONIDE 55 MCG/ACT NA AERO: 2.0000 | INHALATION_SPRAY | Freq: Every day | NASAL | Status: DC

## 2014-02-26 MED ORDER — LEVOCETIRIZINE DIHYDROCHLORIDE 5 MG PO TABS: 5.0000 mg | ORAL_TABLET | Freq: Every evening | ORAL | Status: DC

## 2014-02-26 NOTE — Progress Notes (Signed)
Subjective:    Lindsey Patel is a 80 y.o. female who presents for Medicare Annual/Subsequent preventive examination.  Preventive Screening-Counseling & Management  Tobacco History  Smoking status  . Former Smoker -- 0.50 packs/day for 4 years  . Types: Cigarettes  . Quit date: 12/13/1963  Smokeless tobacco  . Never Used     Problems Prior to Visit 1. none  Current Problems (verified) Patient Active Problem List   Diagnosis Date Noted  . Anxiety state, unspecified 07/24/2013  . Dyspepsia 07/24/2013  . LEUKEMIA, LYMPHOCYTIC, CHRONIC 09/22/2007  . URI 09/22/2007  . DISEASE, WHITE BLOOD CELL NEC 07/01/2007  . SINUSITIS, ACUTE NOS 06/17/2007  . LEUKOCYTOSIS NOS 05/27/2007  . HYPERLIPIDEMIA 05/07/2007  . OSTEOPOROSIS 05/07/2007    Medications Prior to Visit Current Outpatient Prescriptions on File Prior to Visit  Medication Sig Dispense Refill  . aspirin 81 MG tablet Take 81 mg by mouth daily.      . calcium carbonate (OS-CAL) 600 MG TABS Take 600 mg by mouth 2 (two) times daily with a meal.      . fish oil-omega-3 fatty acids 1000 MG capsule Take 2 g by mouth 2 (two) times daily.       . Flaxseed, Linseed, (FLAXSEED OIL PO) Take 1,400 mg by mouth daily.      . Multiple Vitamin (MULTIVITAMIN) tablet Take 1 tablet by mouth daily.      . simvastatin (ZOCOR) 40 MG tablet TAKE 1 TABLET BY MOUTH EVERY NIGHT AT BEDTIME  90 tablet  1   No current facility-administered medications on file prior to visit.    Current Medications (verified) Current Outpatient Prescriptions  Medication Sig Dispense Refill  . aspirin 81 MG tablet Take 81 mg by mouth daily.      . calcium carbonate (OS-CAL) 600 MG TABS Take 600 mg by mouth 2 (two) times daily with a meal.      . fish oil-omega-3 fatty acids 1000 MG capsule Take 2 g by mouth 2 (two) times daily.       . Flaxseed, Linseed, (FLAXSEED OIL PO) Take 1,400 mg by mouth daily.      . Multiple Vitamin (MULTIVITAMIN) tablet Take 1 tablet by  mouth daily.      . simvastatin (ZOCOR) 40 MG tablet TAKE 1 TABLET BY MOUTH EVERY NIGHT AT BEDTIME  90 tablet  1  . levocetirizine (XYZAL) 5 MG tablet Take 1 tablet (5 mg total) by mouth every evening.  30 tablet  5  . triamcinolone (NASACORT AQ) 55 MCG/ACT AERO nasal inhaler Place 2 sprays into the nose daily.  1 Inhaler  12   No current facility-administered medications for this visit.     Allergies (verified) Pneumococcal vaccines   PAST HISTORY  Family History Family History  Problem Relation Age of Onset  . Stroke    . Alzheimer's disease    . Dementia    . Parkinsonism    . Stroke Mother   . Cancer Father     stomach  . Parkinsonism Brother   . Cancer Brother     lung  . Heart disease Brother     MI  . Alzheimer's disease Sister     Social History History  Substance Use Topics  . Smoking status: Former Smoker -- 0.50 packs/day for 4 years    Types: Cigarettes    Quit date: 12/13/1963  . Smokeless tobacco: Never Used  . Alcohol Use: No     Are there smokers in your  home (other than you)? No  Risk Factors Current exercise habits: walking  Dietary issues discussed: na   Cardiac risk factors: advanced age (older than 71 for men, 62 for women), dyslipidemia, hypertension and sedentary lifestyle.  Depression Screen (Note: if answer to either of the following is "Yes", a more complete depression screening is indicated)   Over the past two weeks, have you felt down, depressed or hopeless? No  Over the past two weeks, have you felt little interest or pleasure in doing things? No  Have you lost interest or pleasure in daily life? No  Do you often feel hopeless? No  Do you cry easily over simple problems? No  Activities of Daily Living In your present state of health, do you have any difficulty performing the following activities?:  Driving? No Managing money?  No Feeding yourself? No Getting from bed to chair? No Climbing a flight of stairs? No Preparing  food and eating?: No Bathing or showering? No Getting dressed: No Getting to the toilet? No Using the toilet:No Moving around from place to place: No In the past year have you fallen or had a near fall?:No   Are you sexually active?  No  Do you have more than one partner?  No  Hearing Difficulties: No Do you often ask people to speak up or repeat themselves? No Do you experience ringing or noises in your ears? No Do you have difficulty understanding soft or whispered voices? No   Do you feel that you have a problem with memory? No  Do you often misplace items? No  Do you feel safe at home?  Yes  Cognitive Testing  Alert? Yes  Normal Appearance?Yes  Oriented to person? Yes  Place? Yes   Time? Yes  Recall of three objects?  Yes  Can perform simple calculations? Yes  Displays appropriate judgment?Yes  Can read the correct time from a watch face?Yes   Advanced Directives have been discussed with the patient? Yes  List the Names of Other Physician/Practitioners you currently use: 1.  Heme-- chism 2.  opth- [facility] 3.  Dentist- [facility] 4. Derm-- Tonia Brooms 5.gyn-- white Indicate any recent Medical Services you may have received from other than [facility] providers in the past year (date may be approximate).  Immunization History  Administered Date(s) Administered  . Influenza Split 08/07/2011, 07/21/2012  . Influenza Whole 09/03/2007, 08/04/2008, 08/03/2009, 08/04/2010  . Influenza,inj,Quad PF,36+ Mos 07/24/2013  . Pneumococcal Polysaccharide-23 10/04/2004, 07/21/2012  . Td 10/03/2005    Screening Tests Health Maintenance  Topic Date Due  . Mammogram  08/29/2014 (Originally 01/05/2011)  . Influenza Vaccine  05/22/2014  . Tetanus/tdap  10/04/2015  . Colonoscopy  12/12/2021  . Pneumococcal Polysaccharide Vaccine Age 4 And Over  Completed    All answers were reviewed with the patient and necessary referrals were made:  Garnet Koyanagi, DO   02/26/2014   History reviewed:  She  has  a past medical history of Hyperlipidemia and Osteoporosis. She  does not have any pertinent problems on file. She  has past surgical history that includes Tubal ligation; Cataract extraction (6/03); and Cataract extraction (4/09). Her family history includes Alzheimer's disease in her sister and another family member; Cancer in her brother and father; Dementia in an other family member; Heart disease in her brother; Parkinsonism in her brother and another family member; Stroke in her mother and another family member. She  reports that she quit smoking about 50 years ago. Her smoking use included Cigarettes. She  has a 2 pack-year smoking history. She has never used smokeless tobacco. She reports that she does not drink alcohol or use illicit drugs. She has a current medication list which includes the following prescription(s): aspirin, calcium carbonate, fish oil-omega-3 fatty acids, flaxseed (linseed), multivitamin, simvastatin, levocetirizine, and triamcinolone. Current Outpatient Prescriptions on File Prior to Visit  Medication Sig Dispense Refill  . aspirin 81 MG tablet Take 81 mg by mouth daily.      . calcium carbonate (OS-CAL) 600 MG TABS Take 600 mg by mouth 2 (two) times daily with a meal.      . fish oil-omega-3 fatty acids 1000 MG capsule Take 2 g by mouth 2 (two) times daily.       . Flaxseed, Linseed, (FLAXSEED OIL PO) Take 1,400 mg by mouth daily.      . Multiple Vitamin (MULTIVITAMIN) tablet Take 1 tablet by mouth daily.      . simvastatin (ZOCOR) 40 MG tablet TAKE 1 TABLET BY MOUTH EVERY NIGHT AT BEDTIME  90 tablet  1   No current facility-administered medications on file prior to visit.   She is allergic to pneumococcal vaccines.  Review of Systems  Review of Systems  Constitutional: Negative for activity change, appetite change and fatigue.  HENT: Negative for hearing loss, congestion, tinnitus and ear discharge.   Eyes: Negative for visual disturbance (see optho q1y --  vision corrected to 20/20 with glasses).  Respiratory: Negative for cough, chest tightness and shortness of breath.   Cardiovascular: Negative for chest pain, palpitations and leg swelling.  Gastrointestinal: Negative for abdominal pain, diarrhea, constipation and abdominal distention.  Genitourinary: Negative for urgency, frequency, decreased urine volume and difficulty urinating.  Musculoskeletal: Negative for back pain, arthralgias and gait problem.  Skin: Negative for color change, pallor and rash.  Neurological: Negative for dizziness, light-headedness, numbness and headaches.  Hematological: Negative for adenopathy. Does not bruise/bleed easily.  Psychiatric/Behavioral: Negative for suicidal ideas, confusion, sleep disturbance, self-injury, dysphoric mood, decreased concentration and agitation.  Pt is able to read and write and can do all ADLs No risk for falling No abuse/ violence in home      Objective:     Vision by Snellen chart: opth Body mass index is 25.42 kg/(m^2). BP 114/74  Pulse 56  Temp(Src) 99.1 F (37.3 C) (Oral)  Ht 5\' 2"  (1.575 m)  Wt 139 lb (63.05 kg)  BMI 25.42 kg/m2  SpO2 97%  BP 114/74  Pulse 56  Temp(Src) 99.1 F (37.3 C) (Oral)  Ht 5\' 2"  (1.575 m)  Wt 139 lb (63.05 kg)  BMI 25.42 kg/m2  SpO2 97% General appearance: alert, cooperative, appears stated age and no distress Head: Normocephalic, without obvious abnormality, atraumatic Eyes: negative findings: lids and lashes normal, conjunctivae and sclerae normal and pupils equal, round, reactive to light and accomodation Ears: normal TM's and external ear canals both ears and normal TM&#39;s and external ear canals both ears Nose: Nares normal. Septum midline. Mucosa normal. No drainage or sinus tenderness. Throat: lips, mucosa, and tongue normal; teeth and gums normal Neck: no adenopathy, no carotid bruit, no JVD, supple, symmetrical, trachea midline and thyroid not enlarged, symmetric, no  tenderness/mass/nodules Back: symmetric, no curvature. ROM normal. No CVA tenderness. Lungs: clear to auscultation bilaterally Breasts: gyn Heart: regular rate and rhythm, S1, S2 normal, no murmur, click, rub or gallop Abdomen: soft, non-tender; bowel sounds normal; no masses,  no organomegaly Pelvic: deferred--gyn Extremities: extremities normal, atraumatic, no cyanosis or edema Pulses: 2+ and symmetric  Skin: Skin color, texture, turgor normal. No rashes or lesions Lymph nodes: Cervical, supraclavicular, and axillary nodes normal. Neurologic: Alert and oriented X 3, normal strength and tone. Normal symmetric reflexes. Normal coordination and gait Psych-- no depression, no anxiety      Assessment:     cpe      Plan:     During the course of the visit the patient was educated and counseled about appropriate screening and preventive services including:    Pneumococcal vaccine   Influenza vaccine  Td vaccine  Screening mammography  Screening Pap smear and pelvic exam   Bone densitometry screening  Colorectal cancer screening  Diabetes screening  Glaucoma screening  Advanced directives: has an advanced directive - a copy HAS NOT been provided.  Diet review for nutrition referral? Yes ____  Not Indicated __x__   Patient Instructions (the written plan) was given to the patient.  Medicare Attestation I have personally reviewed: The patient's medical and social history Their use of alcohol, tobacco or illicit drugs Their current medications and supplements The patient's functional ability including ADLs,fall risks, home safety risks, cognitive, and hearing and visual impairment Diet and physical activities Evidence for depression or mood disorders  The patient's weight, height, BMI, and visual acuity have been recorded in the chart.  I have made referrals, counseling, and provided education to the patient based on review of the above and I have provided the  patient with a written personalized care plan for preventive services.    1. Seasonal allergies  - levocetirizine (XYZAL) 5 MG tablet; Take 1 tablet (5 mg total) by mouth every evening.  Dispense: 30 tablet; Refill: 5 - triamcinolone (NASACORT AQ) 55 MCG/ACT AERO nasal inhaler; Place 2 sprays into the nose daily.  Dispense: 1 Inhaler; Refill: 12  2. Medicare annual wellness visit, subsequent   3. Other and unspecified hyperlipidemia Check labs, con't meds - Basic metabolic panel - CBC with Differential - Hepatic function panel - Lipid panel - POCT urinalysis dipstick  Garnet Koyanagi, DO   02/26/2014

## 2014-02-26 NOTE — Patient Instructions (Signed)
Preventive Care for Adults, Female A healthy lifestyle and preventive care can promote health and wellness. Preventive health guidelines for women include the following key practices.  A routine yearly physical is a good way to check with your health care provider about your health and preventive screening. It is a chance to share any concerns and updates on your health and to receive a thorough exam.  Visit your dentist for a routine exam and preventive care every 6 months. Brush your teeth twice a day and floss once a day. Good oral hygiene prevents tooth decay and gum disease.  The frequency of eye exams is based on your age, health, family medical history, use of contact lenses, and other factors. Follow your health care provider's recommendations for frequency of eye exams.  Eat a healthy diet. Foods like vegetables, fruits, whole grains, low-fat dairy products, and lean protein foods contain the nutrients you need without too many calories. Decrease your intake of foods high in solid fats, added sugars, and salt. Eat the right amount of calories for you.Get information about a proper diet from your health care provider, if necessary.  Regular physical exercise is one of the most important things you can do for your health. Most adults should get at least 150 minutes of moderate-intensity exercise (any activity that increases your heart rate and causes you to sweat) each week. In addition, most adults need muscle-strengthening exercises on 2 or more days a week.  Maintain a healthy weight. The body mass index (BMI) is a screening tool to identify possible weight problems. It provides an estimate of body fat based on height and weight. Your health care provider can find your BMI, and can help you achieve or maintain a healthy weight.For adults 20 years and older:  A BMI below 18.5 is considered underweight.  A BMI of 18.5 to 24.9 is normal.  A BMI of 25 to 29.9 is considered overweight.  A  BMI of 30 and above is considered obese.  Maintain normal blood lipids and cholesterol levels by exercising and minimizing your intake of saturated fat. Eat a balanced diet with plenty of fruit and vegetables. Blood tests for lipids and cholesterol should begin at age 62 and be repeated every 5 years. If your lipid or cholesterol levels are high, you are over 50, or you are at high risk for heart disease, you may need your cholesterol levels checked more frequently.Ongoing high lipid and cholesterol levels should be treated with medicines if diet and exercise are not working.  If you smoke, find out from your health care provider how to quit. If you do not use tobacco, do not start.  Lung cancer screening is recommended for adults aged 36 80 years who are at high risk for developing lung cancer because of a history of smoking. A yearly low-dose CT scan of the lungs is recommended for people who have at least a 30-pack-year history of smoking and are a current smoker or have quit within the past 15 years. A pack year of smoking is smoking an average of 1 pack of cigarettes a day for 1 year (for example: 1 pack a day for 30 years or 2 packs a day for 15 years). Yearly screening should continue until the smoker has stopped smoking for at least 15 years. Yearly screening should be stopped for people who develop a health problem that would prevent them from having lung cancer treatment.  If you are pregnant, do not drink alcohol. If you  are breastfeeding, be very cautious about drinking alcohol. If you are not pregnant and choose to drink alcohol, do not have more than 1 drink per day. One drink is considered to be 12 ounces (355 mL) of beer, 5 ounces (148 mL) of wine, or 1.5 ounces (44 mL) of liquor.  Avoid use of street drugs. Do not share needles with anyone. Ask for help if you need support or instructions about stopping the use of drugs.  High blood pressure causes heart disease and increases the risk  of stroke. Your blood pressure should be checked at least every 1 to 2 years. Ongoing high blood pressure should be treated with medicines if weight loss and exercise do not work.  If you are 39 78 years old, ask your health care provider if you should take aspirin to prevent strokes.  Diabetes screening involves taking a blood sample to check your fasting blood sugar level. This should be done once every 3 years, after age 56, if you are within normal weight and without risk factors for diabetes. Testing should be considered at a younger age or be carried out more frequently if you are overweight and have at least 1 risk factor for diabetes.  Breast cancer screening is essential preventive care for women. You should practice "breast self-awareness." This means understanding the normal appearance and feel of your breasts and may include breast self-examination. Any changes detected, no matter how small, should be reported to a health care provider. Women in their 40s and 30s should have a clinical breast exam (CBE) by a health care provider as part of a regular health exam every 1 to 3 years. After age 28, women should have a CBE every year. Starting at age 72, women should consider having a mammogram (breast X-ray test) every year. Women who have a family history of breast cancer should talk to their health care provider about genetic screening. Women at a high risk of breast cancer should talk to their health care providers about having an MRI and a mammogram every year.  Breast cancer gene (BRCA)-related cancer risk assessment is recommended for women who have family members with BRCA-related cancers. BRCA-related cancers include breast, ovarian, tubal, and peritoneal cancers. Having family members with these cancers may be associated with an increased risk for harmful changes (mutations) in the breast cancer genes BRCA1 and BRCA2. Results of the assessment will determine the need for genetic counseling  and BRCA1 and BRCA2 testing.  The Pap test is a screening test for cervical cancer. A Pap test can show cell changes on the cervix that might become cervical cancer if left untreated. A Pap test is a procedure in which cells are obtained and examined from the lower end of the uterus (cervix).  Women should have a Pap test starting at age 59.  Between ages 42 and 13, Pap tests should be repeated every 2 years.  Beginning at age 53, you should have a Pap test every 3 years as long as the past 3 Pap tests have been normal.  Some women have medical problems that increase the chance of getting cervical cancer. Talk to your health care provider about these problems. It is especially important to talk to your health care provider if a new problem develops soon after your last Pap test. In these cases, your health care provider may recommend more frequent screening and Pap tests.  The above recommendations are the same for women who have or have not gotten the vaccine  for human papillomavirus (HPV).  If you had a hysterectomy for a problem that was not cancer or a condition that could lead to cancer, then you no longer need Pap tests. Even if you no longer need a Pap test, a regular exam is a good idea to make sure no other problems are starting.  If you are between ages 58 and 10 years, and you have had normal Pap tests going back 10 years, you no longer need Pap tests. Even if you no longer need a Pap test, a regular exam is a good idea to make sure no other problems are starting.  If you have had past treatment for cervical cancer or a condition that could lead to cancer, you need Pap tests and screening for cancer for at least 20 years after your treatment.  If Pap tests have been discontinued, risk factors (such as a new sexual partner) need to be reassessed to determine if screening should be resumed.  The HPV test is an additional test that may be used for cervical cancer screening. The HPV test  looks for the virus that can cause the cell changes on the cervix. The cells collected during the Pap test can be tested for HPV. The HPV test could be used to screen women aged 67 years and older, and should be used in women of any age who have unclear Pap test results. After the age of 65, women should have HPV testing at the same frequency as a Pap test.  Colorectal cancer can be detected and often prevented. Most routine colorectal cancer screening begins at the age of 25 years and continues through age 66 years. However, your health care provider may recommend screening at an earlier age if you have risk factors for colon cancer. On a yearly basis, your health care provider may provide home test kits to check for hidden blood in the stool. Use of a small camera at the end of a tube, to directly examine the colon (sigmoidoscopy or colonoscopy), can detect the earliest forms of colorectal cancer. Talk to your health care provider about this at age 79, when routine screening begins. Direct exam of the colon should be repeated every 5 10 years through age 47 years, unless early forms of pre-cancerous polyps or small growths are found.  People who are at an increased risk for hepatitis B should be screened for this virus. You are considered at high risk for hepatitis B if:  You were born in a country where hepatitis B occurs often. Talk with your health care provider about which countries are considered high risk.  Your parents were born in a high-risk country and you have not received a shot to protect against hepatitis B (hepatitis B vaccine).  You have HIV or AIDS.  You use needles to inject street drugs.  You live with, or have sex with, someone who has Hepatitis B.  You get hemodialysis treatment.  You take certain medicines for conditions like cancer, organ transplantation, and autoimmune conditions.  Hepatitis C blood testing is recommended for all people born from [birth year] through 1965 and  any individual with known risks for hepatitis C.  Practice safe sex. Use condoms and avoid high-risk sexual practices to reduce the spread of sexually transmitted infections (STIs). STIs include gonorrhea, chlamydia, syphilis, trichomonas, herpes, HPV, and human immunodeficiency virus (HIV). Herpes, HIV, and HPV are viral illnesses that have no cure. They can result in disability, cancer, and death. Sexually active women aged 66  years and younger should be checked for chlamydia. Older women with new or multiple partners should also be tested for chlamydia. Testing for other STIs is recommended if you are sexually active and at increased risk.  Osteoporosis is a disease in which the bones lose minerals and strength with aging. This can result in serious bone fractures or breaks. The risk of osteoporosis can be identified using a bone density scan. Women ages 18 years and over and women at risk for fractures or osteoporosis should discuss screening with their health care providers. Ask your health care provider whether you should take a calcium supplement or vitamin D to reduce the rate of osteoporosis.  Menopause can be associated with physical symptoms and risks. Hormone replacement therapy is available to decrease symptoms and risks. You should talk to your health care provider about whether hormone replacement therapy is right for you.  Use sunscreen. Apply sunscreen liberally and repeatedly throughout the day. You should seek shade when your shadow is shorter than you. Protect yourself by wearing long sleeves, pants, a wide-brimmed hat, and sunglasses year round, whenever you are outdoors.  Once a month, do a whole body skin exam, using a mirror to look at the skin on your back. Tell your health care provider of new moles, moles that have irregular borders, moles that are larger than a pencil eraser, or moles that have changed in shape or color.  Stay current with required vaccines  (immunizations).  Influenza vaccine. All adults should be immunized every year.  Tetanus, diphtheria, and acellular pertussis (Td, Tdap) vaccine. Pregnant women should receive 1 dose of Tdap vaccine during each pregnancy. The dose should be obtained regardless of the length of time since the last dose. Immunization is preferred during the 27th 36th week of gestation. An adult who has not previously received Tdap or who does not know her vaccine status should receive 1 dose of Tdap. This initial dose should be followed by tetanus and diphtheria toxoids (Td) booster doses every 10 years. Adults with an unknown or incomplete history of completing a 3-dose immunization series with Td-containing vaccines should begin or complete a primary immunization series including a Tdap dose. Adults should receive a Td booster every 10 years.  Varicella vaccine. An adult without evidence of immunity to varicella should receive 2 doses or a second dose if she has previously received 1 dose. Pregnant females who do not have evidence of immunity should receive the first dose after pregnancy. This first dose should be obtained before leaving the health care facility. The second dose should be obtained 4 8 weeks after the first dose.  Human papillomavirus (HPV) vaccine. Females aged 9 26 years who have not received the vaccine previously should obtain the 3-dose series. The vaccine is not recommended for use in pregnant females. However, pregnancy testing is not needed before receiving a dose. If a female is found to be pregnant after receiving a dose, no treatment is needed. In that case, the remaining doses should be delayed until after the pregnancy. Immunization is recommended for any person with an immunocompromised condition through the age of 51 years if she did not get any or all doses earlier. During the 3-dose series, the second dose should be obtained 4 8 weeks after the first dose. The third dose should be obtained  24 weeks after the first dose and 16 weeks after the second dose.  Zoster vaccine. One dose is recommended for adults aged 57 years or older unless certain  conditions are present.  Measles, mumps, and rubella (MMR) vaccine. Adults born before [birth year] generally are considered immune to measles and mumps. Adults born in [birth year] or later should have 1 or more doses of MMR vaccine unless there is a contraindication to the vaccine or there is laboratory evidence of immunity to each of the three diseases. A routine second dose of MMR vaccine should be obtained at least 28 days after the first dose for students attending postsecondary schools, health care workers, or international travelers. People who received inactivated measles vaccine or an unknown type of measles vaccine during 1963 1967 should receive 2 doses of MMR vaccine. People who received inactivated mumps vaccine or an unknown type of mumps vaccine before 1979 and are at high risk for mumps infection should consider immunization with 2 doses of MMR vaccine. For females of childbearing age, rubella immunity should be determined. If there is no evidence of immunity, females who are not pregnant should be vaccinated. If there is no evidence of immunity, females who are pregnant should delay immunization until after pregnancy. Unvaccinated health care workers born before [birth year] who lack laboratory evidence of measles, mumps, or rubella immunity or laboratory confirmation of disease should consider measles and mumps immunization with 2 doses of MMR vaccine or rubella immunization with 1 dose of MMR vaccine.  Pneumococcal 13-valent conjugate (PCV13) vaccine. When indicated, a person who is uncertain of her immunization history and has no record of immunization should receive the PCV13 vaccine. An adult aged 42 years or older who has certain medical conditions and has not been previously immunized should receive 1 dose of PCV13 vaccine. This PCV13 should be followed  with a dose of pneumococcal polysaccharide (PPSV23) vaccine. The PPSV23 vaccine dose should be obtained at least 8 weeks after the dose of PCV13 vaccine. An adult aged 4 years or older who has certain medical conditions and previously received 1 or more doses of PPSV23 vaccine should receive 1 dose of PCV13. The PCV13 vaccine dose should be obtained 1 or more years after the last PPSV23 vaccine dose.  Pneumococcal polysaccharide (PPSV23) vaccine. When PCV13 is also indicated, PCV13 should be obtained first. All adults aged 27 years and older should be immunized. An adult younger than age 33 years who has certain medical conditions should be immunized. Any person who resides in a nursing home or long-term care facility should be immunized. An adult smoker should be immunized. People with an immunocompromised condition and certain other conditions should receive both PCV13 and PPSV23 vaccines. People with human immunodeficiency virus (HIV) infection should be immunized as soon as possible after diagnosis. Immunization during chemotherapy or radiation therapy should be avoided. Routine use of PPSV23 vaccine is not recommended for American Indians, [place] Natives, or people younger than 65 years unless there are medical conditions that require PPSV23 vaccine. When indicated, people who have unknown immunization and have no record of immunization should receive PPSV23 vaccine. One-time revaccination 5 years after the first dose of PPSV23 is recommended for people aged 13 64 years who have chronic kidney failure, nephrotic syndrome, asplenia, or immunocompromised conditions. People who received 1 2 doses of PPSV23 before age 66 years should receive another dose of PPSV23 vaccine at age 27 years or later if at least 5 years have passed since the previous dose. Doses of PPSV23 are not needed for people immunized with PPSV23 at or after age 33 years.  Meningococcal vaccine. Adults with asplenia or persistent complement  component deficiencies should receive 2  doses of quadrivalent meningococcal conjugate (MenACWY-D) vaccine. The doses should be obtained at least 2 months apart. Microbiologists working with certain meningococcal bacteria, [place] recruits, people at risk during an outbreak, and people who travel to or live in countries with a high rate of meningitis should be immunized. A first-year college student up through age 49 years who is living in a residence hall should receive a dose if she did not receive a dose on or after her 16th birthday. Adults who have certain high-risk conditions should receive one or more doses of vaccine.  Hepatitis A vaccine. Adults who wish to be protected from this disease, have certain high-risk conditions, work with hepatitis A-infected animals, work in hepatitis A research labs, or travel to or work in countries with a high rate of hepatitis A should be immunized. Adults who were previously unvaccinated and who anticipate close contact with an international adoptee during the first 60 days after arrival in the [place] from a country with a high rate of hepatitis A should be immunized.  Hepatitis B vaccine. Adults who wish to be protected from this disease, have certain high-risk conditions, may be exposed to blood or other infectious body fluids, are household contacts or sex partners of hepatitis B positive people, are clients or workers in certain care facilities, or travel to or work in countries with a high rate of hepatitis B should be immunized.  Haemophilus influenzae type b (Hib) vaccine. A previously unvaccinated person with asplenia or sickle cell disease or having a scheduled splenectomy should receive 1 dose of Hib vaccine. Regardless of previous immunization, a recipient of a hematopoietic stem cell transplant should receive a 3-dose series 6 12 months after her successful transplant. Hib vaccine is not recommended for adults with HIV infection. Preventive  Services / Frequency Ages 24 to 39years  Blood pressure check.** / Every 1 to 2 years.  Lipid and cholesterol check.** / Every 5 years beginning at age 66.  Clinical breast exam.** / Every 3 years for women in their 12s and 24s.  BRCA-related cancer risk assessment.** / For women who have family members with a BRCA-related cancer (breast, ovarian, tubal, or peritoneal cancers).  Pap test.** / Every 2 years from ages 31 through 69. Every 3 years starting at age 64 through age 76 or 89 with a history of 3 consecutive normal Pap tests.  HPV screening.** / Every 3 years from ages 10 through ages 10 to 96 with a history of 3 consecutive normal Pap tests.  Hepatitis C blood test.** / For any individual with known risks for hepatitis C.  Skin self-exam. / Monthly.  Influenza vaccine. / Every year.  Tetanus, diphtheria, and acellular pertussis (Tdap, Td) vaccine.** / Consult your health care provider. Pregnant women should receive 1 dose of Tdap vaccine during each pregnancy. 1 dose of Td every 10 years.  Varicella vaccine.** / Consult your health care provider. Pregnant females who do not have evidence of immunity should receive the first dose after pregnancy.  HPV vaccine. / 3 doses over 6 months, if 90 and younger. The vaccine is not recommended for use in pregnant females. However, pregnancy testing is not needed before receiving a dose.  Measles, mumps, rubella (MMR) vaccine.** / You need at least 1 dose of MMR if you were born in 1957 or later. You may also need a 2nd dose. For females of childbearing age, rubella immunity should be determined. If there is no evidence of immunity, females who are not  pregnant should be vaccinated. If there is no evidence of immunity, females who are pregnant should delay immunization until after pregnancy.  Pneumococcal 13-valent conjugate (PCV13) vaccine.** / Consult your health care provider.  Pneumococcal polysaccharide (PPSV23) vaccine.** / 1 to 2  doses if you smoke cigarettes or if you have certain conditions.  Meningococcal vaccine.** / 1 dose if you are age 88 to 6 years and a Market researcher living in a residence hall, or have one of several medical conditions, you need to get vaccinated against meningococcal disease. You may also need additional booster doses.  Hepatitis A vaccine.** / Consult your health care provider.  Hepatitis B vaccine.** / Consult your health care provider.  Haemophilus influenzae type b (Hib) vaccine.** / Consult your health care provider. Ages 23 to 64years  Blood pressure check.** / Every 1 to 2 years.  Lipid and cholesterol check.** / Every 5 years beginning at age 20 years.  Lung cancer screening. / Every year if you are aged 51 80 years and have a 30-pack-year history of smoking and currently smoke or have quit within the past 15 years. Yearly screening is stopped once you have quit smoking for at least 15 years or develop a health problem that would prevent you from having lung cancer treatment.  Clinical breast exam.** / Every year after age 8 years.  BRCA-related cancer risk assessment.** / For women who have family members with a BRCA-related cancer (breast, ovarian, tubal, or peritoneal cancers).  Mammogram.** / Every year beginning at age 10 years and continuing for as long as you are in good health. Consult with your health care provider.  Pap test.** / Every 3 years starting at age 30 years through age 5 or 61 years with a history of 3 consecutive normal Pap tests.  HPV screening.** / Every 3 years from ages 39 years through ages 72 to 19 years with a history of 3 consecutive normal Pap tests.  Fecal occult blood test (FOBT) of stool. / Every year beginning at age 59 years and continuing until age 27 years. You may not need to do this test if you get a colonoscopy every 10 years.  Flexible sigmoidoscopy or colonoscopy.** / Every 5 years for a flexible sigmoidoscopy or every  10 years for a colonoscopy beginning at age 110 years and continuing until age 63 years.  Hepatitis C blood test.** / For all people born from [birth year] through 1963 and any individual with known risks for hepatitis C.  Skin self-exam. / Monthly.  Influenza vaccine. / Every year.  Tetanus, diphtheria, and acellular pertussis (Tdap/Td) vaccine.** / Consult your health care provider. Pregnant women should receive 1 dose of Tdap vaccine during each pregnancy. 1 dose of Td every 10 years.  Varicella vaccine.** / Consult your health care provider. Pregnant females who do not have evidence of immunity should receive the first dose after pregnancy.  Zoster vaccine.** / 1 dose for adults aged 46 years or older.  Measles, mumps, rubella (MMR) vaccine.** / You need at least 1 dose of MMR if you were born in 1957 or later. You may also need a 2nd dose. For females of childbearing age, rubella immunity should be determined. If there is no evidence of immunity, females who are not pregnant should be vaccinated. If there is no evidence of immunity, females who are pregnant should delay immunization until after pregnancy.  Pneumococcal 13-valent conjugate (PCV13) vaccine.** / Consult your health care provider.  Pneumococcal polysaccharide (PPSV23) vaccine.** / 1  to 2 doses if you smoke cigarettes or if you have certain conditions.  Meningococcal vaccine.** / Consult your health care provider.  Hepatitis A vaccine.** / Consult your health care provider.  Hepatitis B vaccine.** / Consult your health care provider.  Haemophilus influenzae type b (Hib) vaccine.** / Consult your health care provider. Ages 74 years and over  Blood pressure check.** / Every 1 to 2 years.  Lipid and cholesterol check.** / Every 5 years beginning at age 69 years.  Lung cancer screening. / Every year if you are aged 95 80 years and have a 30-pack-year history of smoking and currently smoke or have quit within the past 15 years.  Yearly screening is stopped once you have quit smoking for at least 15 years or develop a health problem that would prevent you from having lung cancer treatment.  Clinical breast exam.** / Every year after age 39 years.  BRCA-related cancer risk assessment.** / For women who have family members with a BRCA-related cancer (breast, ovarian, tubal, or peritoneal cancers).  Mammogram.** / Every year beginning at age 37 years and continuing for as long as you are in good health. Consult with your health care provider.  Pap test.** / Every 3 years starting at age 47 years through age 77 or 71 years with 3 consecutive normal Pap tests. Testing can be stopped between 65 and 70 years with 3 consecutive normal Pap tests and no abnormal Pap or HPV tests in the past 10 years.  HPV screening.** / Every 3 years from ages 33 years through ages 23 or 39 years with a history of 3 consecutive normal Pap tests. Testing can be stopped between 65 and 70 years with 3 consecutive normal Pap tests and no abnormal Pap or HPV tests in the past 10 years.  Fecal occult blood test (FOBT) of stool. / Every year beginning at age 8 years and continuing until age 81 years. You may not need to do this test if you get a colonoscopy every 10 years.  Flexible sigmoidoscopy or colonoscopy.** / Every 5 years for a flexible sigmoidoscopy or every 10 years for a colonoscopy beginning at age 65 years and continuing until age 74 years.  Hepatitis C blood test.** / For all people born from [birth year] through 1963 and any individual with known risks for hepatitis C.  Osteoporosis screening.** / A one-time screening for women ages 5 years and over and women at risk for fractures or osteoporosis.  Skin self-exam. / Monthly.  Influenza vaccine. / Every year.  Tetanus, diphtheria, and acellular pertussis (Tdap/Td) vaccine.** / 1 dose of Td every 10 years.  Varicella vaccine.** / Consult your health care provider.  Zoster vaccine.** / 1  dose for adults aged 58 years or older.  Pneumococcal 13-valent conjugate (PCV13) vaccine.** / Consult your health care provider.  Pneumococcal polysaccharide (PPSV23) vaccine.** / 1 dose for all adults aged 36 years and older.  Meningococcal vaccine.** / Consult your health care provider.  Hepatitis A vaccine.** / Consult your health care provider.  Hepatitis B vaccine.** / Consult your health care provider.  Haemophilus influenzae type b (Hib) vaccine.** / Consult your health care provider. ** Family history and personal history of risk and conditions may change your health care provider's recommendations. Document Released: 12/04/2001 Document Revised: 07/29/2013 Document Reviewed: 03/05/2011 [facility] Patient Information 2014 [place], [place].

## 2014-02-26 NOTE — Progress Notes (Signed)
Pre visit review using our clinic review tool, if applicable. No additional management support is needed unless otherwise documented below in the visit note. 

## 2014-03-09 ENCOUNTER — Other Ambulatory Visit: Payer: Self-pay | Admitting: Family Medicine

## 2014-07-12 ENCOUNTER — Other Ambulatory Visit: Payer: Self-pay | Admitting: *Deleted

## 2014-07-12 ENCOUNTER — Other Ambulatory Visit (HOSPITAL_BASED_OUTPATIENT_CLINIC_OR_DEPARTMENT_OTHER): Payer: Medicare PPO

## 2014-07-12 ENCOUNTER — Ambulatory Visit (HOSPITAL_BASED_OUTPATIENT_CLINIC_OR_DEPARTMENT_OTHER): Payer: Medicare PPO | Admitting: Hematology

## 2014-07-12 ENCOUNTER — Telehealth: Payer: Self-pay | Admitting: Hematology

## 2014-07-12 VITALS — BP 184/75 | HR 84 | Temp 98.1°F | Resp 18 | Ht 62.0 in | Wt 138.9 lb

## 2014-07-12 DIAGNOSIS — M810 Age-related osteoporosis without current pathological fracture: Secondary | ICD-10-CM

## 2014-07-12 DIAGNOSIS — D72829 Elevated white blood cell count, unspecified: Secondary | ICD-10-CM

## 2014-07-12 DIAGNOSIS — C9110 Chronic lymphocytic leukemia of B-cell type not having achieved remission: Secondary | ICD-10-CM

## 2014-07-12 DIAGNOSIS — Z23 Encounter for immunization: Secondary | ICD-10-CM

## 2014-07-12 LAB — COMPREHENSIVE METABOLIC PANEL (CC13)
ALBUMIN: 3.5 g/dL (ref 3.5–5.0)
ALT: 16 U/L (ref 0–55)
ANION GAP: 10 meq/L (ref 3–11)
AST: 22 U/L (ref 5–34)
Alkaline Phosphatase: 69 U/L (ref 40–150)
BUN: 8.8 mg/dL (ref 7.0–26.0)
CO2: 25 mEq/L (ref 22–29)
Calcium: 9.5 mg/dL (ref 8.4–10.4)
Chloride: 105 mEq/L (ref 98–109)
Creatinine: 0.7 mg/dL (ref 0.6–1.1)
GLUCOSE: 87 mg/dL (ref 70–140)
Potassium: 4.1 mEq/L (ref 3.5–5.1)
SODIUM: 140 meq/L (ref 136–145)
TOTAL PROTEIN: 6.4 g/dL (ref 6.4–8.3)
Total Bilirubin: 0.32 mg/dL (ref 0.20–1.20)

## 2014-07-12 LAB — CBC WITH DIFFERENTIAL/PLATELET
BASO%: 0.3 % (ref 0.0–2.0)
Basophils Absolute: 0.1 10*3/uL (ref 0.0–0.1)
EOS%: 1.8 % (ref 0.0–7.0)
Eosinophils Absolute: 0.4 10*3/uL (ref 0.0–0.5)
HEMATOCRIT: 41.7 % (ref 34.8–46.6)
HGB: 13.8 g/dL (ref 11.6–15.9)
LYMPH#: 15.1 10*3/uL — AB (ref 0.9–3.3)
LYMPH%: 76.7 % — ABNORMAL HIGH (ref 14.0–49.7)
MCH: 30.9 pg (ref 25.1–34.0)
MCHC: 33.1 g/dL (ref 31.5–36.0)
MCV: 93.3 fL (ref 79.5–101.0)
MONO#: 0.9 10*3/uL (ref 0.1–0.9)
MONO%: 4.4 % (ref 0.0–14.0)
NEUT%: 16.8 % — ABNORMAL LOW (ref 38.4–76.8)
NEUTROS ABS: 3.3 10*3/uL (ref 1.5–6.5)
PLATELETS: 221 10*3/uL (ref 145–400)
RBC: 4.47 10*6/uL (ref 3.70–5.45)
RDW: 13.3 % (ref 11.2–14.5)
WBC: 19.7 10*3/uL — AB (ref 3.9–10.3)

## 2014-07-12 LAB — TECHNOLOGIST REVIEW

## 2014-07-12 MED ORDER — INFLUENZA VAC SPLIT QUAD 0.5 ML IM SUSY
0.5000 mL | PREFILLED_SYRINGE | Freq: Once | INTRAMUSCULAR | Status: AC
  Administered 2014-07-12: 0.5 mL via INTRAMUSCULAR
  Filled 2014-07-12: qty 0.5

## 2014-07-12 NOTE — Patient Instructions (Signed)
Influenza Vaccine (Flu Vaccine, Inactivated or Recombinant) 2014-2015: What You Need to Know 1. Why get vaccinated? Influenza ("flu") is a contagious disease that spreads around the [place] every winter, usually between October and May. Flu is caused by influenza viruses, and is spread mainly by coughing, sneezing, and close contact. Anyone can get flu, but the risk of getting flu is highest among children. Symptoms come on suddenly and may last several days. They can include:  fever/chills  sore throat  muscle aches  fatigue  cough  headache  runny or stuffy nose Flu can make some people much sicker than others. These people include young children, people 65 and older, pregnant women, and people with certain health conditions-such as heart, lung or kidney disease, nervous system disorders, or a weakened immune system. Flu vaccination is especially important for these people, and anyone in close contact with them. Flu can also lead to pneumonia, and make existing medical conditions worse. It can cause diarrhea and seizures in children. Each year thousands of people in the [place] die from flu, and many more are hospitalized. Flu vaccine is the best protection against flu and its complications. Flu vaccine also helps prevent spreading flu from person to person. 2. Inactivated and recombinant flu vaccines You are getting an injectable flu vaccine, which is either an "inactivated" or "recombinant" vaccine. These vaccines do not contain any live influenza virus. They are given by injection with a needle, and often called the "flu shot."  A different live, attenuated (weakened) influenza vaccine is sprayed into the nostrils. This vaccine is described in a separate Vaccine Information Statement. Flu vaccination is recommended every year. Some children 6 months through 8 years of age might need two doses during one year. Flu viruses are always changing. Each year's flu vaccine is made  to protect against 3 or 4 viruses that are likely to cause disease that year. Flu vaccine cannot prevent all cases of flu, but it is the best defense against the disease.  It takes about 2 weeks for protection to develop after the vaccination, and protection lasts several months to a year. Some illnesses that are not caused by influenza virus are often mistaken for flu. Flu vaccine will not prevent these illnesses. It can only prevent influenza. Some inactivated flu vaccine contains a very small amount of a mercury-based preservative called thimerosal. Studies have shown that thimerosal in vaccines is not harmful, but flu vaccines that do not contain a preservative are available. 3. Some people should not get this vaccine Tell the person who gives you the vaccine:  If you have any severe, life-threatening allergies. If you ever had a life-threatening allergic reaction after a dose of flu vaccine, or have a severe allergy to any part of this vaccine, including (for example) an allergy to gelatin, antibiotics, or eggs, you may be advised not to get vaccinated. Most, but not all, types of flu vaccine contain a small amount of egg protein.  If you ever had Guillain-Barr Syndrome (a severe paralyzing illness, also called GBS). Some people with a history of GBS should not get this vaccine. This should be discussed with your doctor.  If you are not feeling well. It is usually okay to get flu vaccine when you have a mild illness, but you might be advised to wait until you feel better. You should come back when you are better. 4. Risks of a vaccine reaction With a vaccine, like any medicine, there is a chance of side   effects. These are usually mild and go away on their own. Problems that could happen after any vaccine:  Brief fainting spells can happen after any medical procedure, including vaccination. Sitting or lying down for about 15 minutes can help prevent fainting, and injuries caused by a fall. Tell  your doctor if you feel dizzy, or have vision changes or ringing in the ears.  Severe shoulder pain and reduced range of motion in the arm where a shot was given can happen, very rarely, after a vaccination.  Severe allergic reactions from a vaccine are very rare, estimated at less than 1 in a million doses. If one were to occur, it would usually be within a few minutes to a few hours after the vaccination. Mild problems following inactivated flu vaccine:  soreness, redness, or swelling where the shot was given  hoarseness  sore, red or itchy eyes  cough  fever  aches  headache  itching  fatigue If these problems occur, they usually begin soon after the shot and last 1 or 2 days. Moderate problems following inactivated flu vaccine:  Young children who get inactivated flu vaccine and pneumococcal vaccine (PCV13) at the same time may be at increased risk for seizures caused by fever. Ask your doctor for more information. Tell your doctor if a child who is getting flu vaccine has ever had a seizure. Inactivated flu vaccine does not contain live flu virus, so you cannot get the flu from this vaccine. As with any medicine, there is a very remote chance of a vaccine causing a serious injury or death. The safety of vaccines is always being monitored. For more information, visit: www.cdc.gov/vaccinesafety/ 5. What if there is a serious reaction? What should I look for?  Look for anything that concerns you, such as signs of a severe allergic reaction, very high fever, or behavior changes. Signs of a severe allergic reaction can include hives, swelling of the face and throat, difficulty breathing, a fast heartbeat, dizziness, and weakness. These would start a few minutes to a few hours after the vaccination. What should I do?  If you think it is a severe allergic reaction or other emergency that can't wait, call 9-1-1 and get the person to the nearest hospital. Otherwise, call your  doctor.  Afterward, the reaction should be reported to the Vaccine Adverse Event Reporting System (VAERS). Your doctor should file this report, or you can do it yourself through the VAERS web site at www.vaers.hhs.gov, or by calling 1-800-822-7967. VAERS does not give medical advice. 6. The National Vaccine Injury Compensation Program The National Vaccine Injury Compensation Program (VICP) is a federal program that was created to compensate people who may have been injured by certain vaccines. Persons who believe they may have been injured by a vaccine can learn about the program and about filing a claim by calling 1-800-338-2382 or visiting the VICP website at www.hrsa.gov/vaccinecompensation. There is a time limit to file a claim for compensation. 7. How can I learn more?  Ask your health care provider.  Call your local or state health department.  Contact the Centers for Disease Control and Prevention (CDC):  Call 1-800-232-4636 (1-800-CDC-INFO) or  Visit CDC's website at www.cdc.gov/flu CDC Vaccine Information Statement (Interim) Inactivated Influenza Vaccine (06/09/2013) Document Released: 08/02/2006 Document Revised: 02/22/2014 Document Reviewed: 09/25/2013 [employer] Patient Information 2015 [employer]. This information is not intended to replace advice given to you by your health care provider. Make sure you discuss any questions you have with your health   care provider.  

## 2014-07-12 NOTE — Telephone Encounter (Signed)
Pt confirmed labs/ov per 09/21 POF....Lindsey Patel

## 2014-07-13 ENCOUNTER — Encounter: Payer: Self-pay | Admitting: Hematology

## 2014-07-13 NOTE — Progress Notes (Signed)
[facility] PROGRESS NOTE 07/12/2014  Lindsey Koyanagi, DO [place] [id number]  DIAGNOSIS: CLL (chronic lymphocytic leukemia) - Plan: Influenza vac split quadrivalent PF (FLUARIX) injection 0.5 mL, CBC with Differential, Comprehensive metabolic panel (Cmet) - [facility], Lactate dehydrogenase (LDH) - [facility]  Chief Complaint  Patient presents with  . Follow-up    CURRENT THERAPY: Active Surveillance.  INTERVAL HISTORY: Lindsey Patel 80 y.o. female with a history of Chronic lymphocytic leukemia, stage 0, with diagnosis going back to September 2006. Diagnostic flow cytometry on the peripheral blood  was carried out on 08/19/2007. The patient has not had a bone marrow nor has she required any treatment.  She presents for follow-up today.  She was last seen by Dr. Juliann Patel on 07/10/2013. She lives alone and is fully independent and drives during the day time. She denies recent hospitilizations, fevers or chills or night sweats.     MEDICAL HISTORY: Past Medical History  Diagnosis Date  . Hyperlipidemia   . Osteoporosis     INTERIM HISTORY: has LEUKEMIA, LYMPHOCYTIC, CHRONIC; HYPERLIPIDEMIA; LEUKOCYTOSIS NOS; DISEASE, WHITE BLOOD CELL NEC; SINUSITIS, ACUTE NOS; URI; OSTEOPOROSIS; Anxiety state, unspecified; and Dyspepsia on her problem list.    ALLERGIES:  is allergic to pneumococcal vaccines.  MEDICATIONS: has a current medication list which includes the following prescription(s): aspirin, calcium carbonate, fish oil-omega-3 fatty acids, flaxseed (linseed), levocetirizine, multivitamin, simvastatin, and triamcinolone.  SURGICAL HISTORY:  Past Surgical History  Procedure Laterality Date  . Tubal ligation    . Cataract extraction  6/03    left  . Cataract extraction  4/09    right    REVIEW OF SYSTEMS:   Constitutional: Denies fevers, chills or abnormal weight loss Eyes: Denies blurriness of vision Ears, nose, mouth, throat, and  face: Denies mucositis or sore throat Respiratory: Denies cough, dyspnea or wheezes Cardiovascular: Denies palpitation, chest discomfort or lower extremity swelling Gastrointestinal:  Denies nausea, heartburn or change in bowel habits Skin: Denies abnormal skin rashes Lymphatics: Denies new lymphadenopathy or easy bruising Neurological:Denies numbness, tingling or new weaknesses Behavioral/Psych: Mood is stable, no new changes  All other systems were reviewed with the patient and are negative.  PHYSICAL EXAMINATION: ECOG PERFORMANCE STATUS: 0  Blood pressure 184/75, pulse 84, temperature 98.1 F (36.7 C), temperature source Oral, resp. rate 18, height 5\' 2"  (1.575 m), weight 138 lb 14.4 oz (63.005 kg).  GENERAL:alert, no distress and comfortable elderly female SKIN: skin color, texture, turgor are normal, no rashes; multiple seborrheic keratoses EYES: normal, Conjunctiva are pink and non-injected, sclera clear OROPHARYNX:no exudate, no erythema and lips, buccal mucosa, and tongue normal  NECK: supple, thyroid normal size, non-tender, without nodularity LYMPH:  no palpable lymphadenopathy in the cervical, axillary or inguinal LUNGS: clear to auscultation and percussion with normal breathing effort HEART: regular rate & rhythm and no murmurs and no lower extremity edema ABDOMEN:abdomen soft, non-tender and normal bowel sounds Musculoskeletal:no cyanosis of digits and no clubbing  NEURO: alert & oriented x 3 with fluent speech, no focal motor/sensory deficits   LABORATORY DATA: CBC    Component Value Date/Time   WBC 19.7* 07/12/2014 1020   WBC 15.8* 02/26/2014 1035   RBC 4.47 07/12/2014 1020   RBC 4.33 02/26/2014 1035   HGB 13.8 07/12/2014 1020   HGB 13.5 02/26/2014 1035   HCT 41.7 07/12/2014 1020   HCT 40.9 02/26/2014 1035   PLT 221 07/12/2014 1020   PLT 221.0 02/26/2014 1035  MCV 93.3 07/12/2014 1020   MCV 94.6 02/26/2014 1035   MCH 30.9 07/12/2014 1020   MCHC 33.1 07/12/2014 1020   MCHC  32.9 02/26/2014 1035   RDW 13.3 07/12/2014 1020   RDW 13.3 02/26/2014 1035   LYMPHSABS 15.1* 07/12/2014 1020   LYMPHSABS 11.0* 02/26/2014 1035   MONOABS 0.9 07/12/2014 1020   MONOABS 0.7 02/26/2014 1035   EOSABS 0.4 07/12/2014 1020   EOSABS 0.2 02/26/2014 1035   BASOSABS 0.1 07/12/2014 1020   BASOSABS 0.0 02/26/2014 1035    CMP     Component Value Date/Time   NA 140 07/12/2014 1021   NA 140 02/26/2014 1035   K 4.1 07/12/2014 1021   K 3.7 02/26/2014 1035   CL 107 02/26/2014 1035   CL 104 07/11/2012 1315   CO2 25 07/12/2014 1021   CO2 25 02/26/2014 1035   GLUCOSE 87 07/12/2014 1021   GLUCOSE 82 02/26/2014 1035   GLUCOSE 85 07/11/2012 1315   GLUCOSE 92 10/01/2006 0856   BUN 8.8 07/12/2014 1021   BUN 8 02/26/2014 1035   CREATININE 0.7 07/12/2014 1021   CREATININE 0.6 02/26/2014 1035   CALCIUM 9.5 07/12/2014 1021   CALCIUM 9.3 02/26/2014 1035   PROT 6.4 07/12/2014 1021   PROT 6.1 02/26/2014 1035   ALBUMIN 3.5 07/12/2014 1021   ALBUMIN 3.8 02/26/2014 1035   AST 22 07/12/2014 1021   AST 26 02/26/2014 1035   ALT 16 07/12/2014 1021   ALT 17 02/26/2014 1035   ALKPHOS 69 07/12/2014 1021   ALKPHOS 51 02/26/2014 1035   BILITOT 0.32 07/12/2014 1021   BILITOT 0.7 02/26/2014 1035   GFRNONAA 108.22 04/25/2010 0903   GFRAA 104 12/08/2008 0000   Results for Lindsey, Patel (MRN [id number]) as of 07/12/2013 18:01  Ref. Range 01/08/2013 09:49  IgG (Immunoglobin G), Serum Latest Range: 8135231458 mg/dL 477 (L)  IgA Latest Range: 69-380 mg/dL 74   RADIOGRAPHIC STUDIES: No results found.  ASSESSMENT: CLL  PLAN:  -- She is now 9 years from the time of diagnosis and continues to do well.   -- We will check for immunoglobin and CBC in 12 months per her history of low IgG.  She has not had any infections. --RTC in 1 year with CBC, CMP. --Continue age appropriate screenings. --she did get a flu shot in office.  All questions were answered. The patient knows to call the clinic with any problems, questions or concerns. We can certainly see the  patient much sooner if necessary.   I spent 15 minutes counseling the patient face to face. The total time spent in the appointment was 30 minutes.    Bernadene Bell, MD Medical Hematologist/Oncologist [place] Pager: [phone] Office No: [phone]

## 2014-08-30 ENCOUNTER — Ambulatory Visit (INDEPENDENT_AMBULATORY_CARE_PROVIDER_SITE_OTHER): Payer: Medicare PPO | Admitting: Family Medicine

## 2014-08-30 ENCOUNTER — Encounter: Payer: Self-pay | Admitting: Family Medicine

## 2014-08-30 VITALS — BP 138/74 | HR 97 | Temp 98.1°F | Wt 139.0 lb

## 2014-08-30 DIAGNOSIS — J302 Other seasonal allergic rhinitis: Secondary | ICD-10-CM

## 2014-08-30 DIAGNOSIS — E785 Hyperlipidemia, unspecified: Secondary | ICD-10-CM

## 2014-08-30 LAB — BASIC METABOLIC PANEL
BUN: 11 mg/dL (ref 6–23)
CALCIUM: 9.6 mg/dL (ref 8.4–10.5)
CHLORIDE: 107 meq/L (ref 96–112)
CO2: 24 mEq/L (ref 19–32)
CREATININE: 0.7 mg/dL (ref 0.4–1.2)
GFR: 87.35 mL/min (ref >60.00)
Glucose, Bld: 80 mg/dL (ref 70–99)
Potassium: 3.8 mEq/L (ref 3.5–5.1)
Sodium: 141 mEq/L (ref 135–145)

## 2014-08-30 LAB — LIPID PANEL
CHOL/HDL RATIO: 3
CHOLESTEROL: 158 mg/dL (ref 0–200)
HDL: 53.1 mg/dL (ref >39.00)
LDL Cholesterol: 78 mg/dL (ref 0–99)
NonHDL: 104.9
TRIGLYCERIDES: 134 mg/dL (ref 0.0–149.0)
VLDL: 26.8 mg/dL (ref 0.0–40.0)

## 2014-08-30 LAB — HEPATIC FUNCTION PANEL
ALT: 18 U/L (ref 0–35)
AST: 28 U/L (ref 0–37)
Albumin: 3.5 g/dL (ref 3.5–5.2)
Alkaline Phosphatase: 59 U/L (ref 39–117)
BILIRUBIN DIRECT: 0 mg/dL (ref 0.0–0.3)
Total Bilirubin: 0.4 mg/dL (ref 0.2–1.2)
Total Protein: 6.5 g/dL (ref 6.0–8.3)

## 2014-08-30 MED ORDER — LEVOCETIRIZINE DIHYDROCHLORIDE 5 MG PO TABS: 5.0000 mg | ORAL_TABLET | Freq: Every evening | ORAL | Status: DC

## 2014-08-30 NOTE — Patient Instructions (Signed)

## 2014-08-30 NOTE — Progress Notes (Signed)
Pre visit review using our clinic review tool, if applicable. No additional management support is needed unless otherwise documented below in the visit note. 

## 2014-08-30 NOTE — Progress Notes (Signed)
    Subjective:     HPI: Lindsey Patel is a 80 y.o. female here for follow up of dyslipidemia. The patient does not use medications that may worsen dyslipidemias (corticosteroids, progestins, anabolic steroids, diuretics, beta-blockers, amiodarone, cyclosporine, olanzapine). The patient exercises infrequently. The patient is not known to have coexisting coronary artery disease.    Review of Systems: Review of Systems - General ROS: negative Allergy and Immunology ROS: positive for - postnasal drip and seasonal allergies Respiratory ROS: no cough, shortness of breath, or wheezing Cardiovascular ROS: no chest pain or dyspnea on exertion Neurological ROS: no TIA or stroke symptoms      Objective:    Filed Vitals:   08/30/14 0913  BP: 138/74  Pulse: 97  Temp: 98.1 F (36.7 C)    Physical Exam: General Appearance:  awake, alert, oriented, in no acute distress, well developed, well nourished and in no acute distress Neck:  neck- supple, no mass, non-tender Lungs:  Normal expansion.  Clear to auscultation.  No rales, rhonchi, or wheezing. Heart:  Heart sounds are normal.  Regular rate and rhythm without murmur, gallop or rub. Extremities: Extremities warm to touch, pink, with no edema.   Lab Review Lab Results  Component Value Date   CHOL 139 02/26/2014   CHOL 153 07/24/2013   CHOL 159 12/18/2012   HDL 57.20 02/26/2014   HDL 64.50 07/24/2013   HDL 58.80 12/18/2012   LDLDIRECT 93.9 12/08/2008       Assessment:     Problem List Items Addressed This Visit    None    Visit Diagnoses    Hyperlipidemia    -  Primary    Relevant Orders       Basic metabolic panel       Hepatic function panel       Lipid panel    Seasonal allergies        Relevant Medications       levocetirizine (XYZAL) tablet 5 mg           Plan:    The following changes are planned for the next 6 months, at which time the patient will return for repeat fasting lipids:  1. Dietary  recommendations: Reduce saturated fat, "trans" monounsaturated fatty acids, and cholesterol 2. Exercise recommendations:  An average 40 minutes of moderate to vigorous-intensity aerobic activity 3 or 4 times per week 3. Other treatment: None  No Follow-up on file. -----6 months

## 2014-08-30 NOTE — Assessment & Plan Note (Signed)
con't meds Check labs today

## 2014-09-01 ENCOUNTER — Other Ambulatory Visit: Payer: Self-pay | Admitting: Family Medicine

## 2015-02-24 ENCOUNTER — Other Ambulatory Visit: Payer: Self-pay | Admitting: Family Medicine

## 2015-03-01 ENCOUNTER — Ambulatory Visit: Payer: Medicare PPO | Admitting: Family Medicine

## 2015-03-04 ENCOUNTER — Encounter: Payer: Self-pay | Admitting: Family Medicine

## 2015-03-04 ENCOUNTER — Ambulatory Visit (INDEPENDENT_AMBULATORY_CARE_PROVIDER_SITE_OTHER): Payer: Medicare PPO | Admitting: Family Medicine

## 2015-03-04 VITALS — BP 132/68 | HR 86 | Temp 99.1°F | Wt 135.4 lb

## 2015-03-04 DIAGNOSIS — E785 Hyperlipidemia, unspecified: Secondary | ICD-10-CM | POA: Diagnosis not present

## 2015-03-04 LAB — BASIC METABOLIC PANEL
BUN: 10 mg/dL (ref 6–23)
CHLORIDE: 101 meq/L (ref 96–112)
CO2: 30 meq/L (ref 19–32)
Calcium: 9.8 mg/dL (ref 8.4–10.5)
Creatinine, Ser: 0.58 mg/dL (ref 0.40–1.20)
GFR: 104.83 mL/min (ref >60.00)
Glucose, Bld: 92 mg/dL (ref 70–99)
POTASSIUM: 4.1 meq/L (ref 3.5–5.1)
Sodium: 136 mEq/L (ref 135–145)

## 2015-03-04 LAB — LIPID PANEL
Cholesterol: 154 mg/dL (ref 0–200)
HDL: 62.6 mg/dL (ref >39.00)
LDL Cholesterol: 66 mg/dL (ref 0–99)
NONHDL: 91.4
TRIGLYCERIDES: 127 mg/dL (ref 0.0–149.0)
Total CHOL/HDL Ratio: 2
VLDL: 25.4 mg/dL (ref 0.0–40.0)

## 2015-03-04 LAB — HEPATIC FUNCTION PANEL
ALBUMIN: 4 g/dL (ref 3.5–5.2)
ALT: 13 U/L (ref 0–35)
AST: 21 U/L (ref 0–37)
Alkaline Phosphatase: 70 U/L (ref 39–117)
BILIRUBIN DIRECT: 0 mg/dL (ref 0.0–0.3)
Total Bilirubin: 0.4 mg/dL (ref 0.2–1.2)
Total Protein: 6.7 g/dL (ref 6.0–8.3)

## 2015-03-04 NOTE — Progress Notes (Signed)
Subjective:    Patient ID: Lindsey Patel, female    DOB: 04/23/1927, 81 y.o.   MRN: [id number]  HPI  Patient here for f/u cholesterol.  No complaints  Past Medical History  Diagnosis Date  . Hyperlipidemia   . Osteoporosis   . Leukemia     Review of Systems  Constitutional: Negative for activity change, appetite change, fatigue and unexpected weight change.  Respiratory: Negative for cough and shortness of breath.   Cardiovascular: Negative for chest pain and palpitations.  Hematological: Negative for adenopathy. Does not bruise/bleed easily.  Psychiatric/Behavioral: Negative for behavioral problems and dysphoric mood. The patient is not nervous/anxious.     Current Outpatient Prescriptions on File Prior to Visit  Medication Sig Dispense Refill  . aspirin 81 MG tablet Take 81 mg by mouth daily.    . calcium carbonate (OS-CAL) 600 MG TABS Take 600 mg by mouth 2 (two) times daily with a meal.    . fish oil-omega-3 fatty acids 1000 MG capsule Take 2 g by mouth 2 (two) times daily.     . Flaxseed, Linseed, (FLAXSEED OIL PO) Take 1,400 mg by mouth daily.    . Multiple Vitamin (MULTIVITAMIN) tablet Take 1 tablet by mouth daily.    . simvastatin (ZOCOR) 40 MG tablet TAKE 1 TABLET BY MOUTH EVERY NIGHT AT BEDTIME 90 tablet 1  . levocetirizine (XYZAL) 5 MG tablet Take 1 tablet (5 mg total) by mouth every evening. (Patient not taking: Reported on 03/04/2015) 90 tablet 3  . triamcinolone (NASACORT AQ) 55 MCG/ACT AERO nasal inhaler Place 2 sprays into the nose daily. (Patient not taking: Reported on 03/04/2015) 1 Inhaler 12   No current facility-administered medications on file prior to visit.       Objective:    Physical Exam  Constitutional: She is oriented to person, place, and time. She appears well-developed and well-nourished.  HENT:  Head: Normocephalic and atraumatic.  Eyes: Conjunctivae and EOM are normal.  Neck: Normal range of motion. Neck supple. No JVD present. Carotid  bruit is not present. No thyromegaly present.  Cardiovascular: Normal rate, regular rhythm and normal heart sounds.   No murmur heard. Pulmonary/Chest: Effort normal and breath sounds normal. No respiratory distress. She has no wheezes. She has no rales. She exhibits no tenderness.  Musculoskeletal: She exhibits no edema.  Neurological: She is alert and oriented to person, place, and time.  Psychiatric: She has a normal mood and affect.    BP 132/68 mmHg  Pulse 86  Temp(Src) 99.1 F (37.3 C) (Oral)  Wt 135 lb 6.4 oz (61.417 kg)  SpO2 96% Wt Readings from Last 3 Encounters:  03/04/15 135 lb 6.4 oz (61.417 kg)  08/30/14 139 lb (63.05 kg)  07/12/14 138 lb 14.4 oz (63.005 kg)     Lab Results  Component Value Date   WBC 19.7* 07/12/2014   HGB 13.8 07/12/2014   HCT 41.7 07/12/2014   PLT 221 07/12/2014   GLUCOSE 80 08/30/2014   CHOL 158 08/30/2014   TRIG 134.0 08/30/2014   HDL 53.10 08/30/2014   LDLDIRECT 93.9 12/08/2008   LDLCALC 78 08/30/2014   ALT 18 08/30/2014   AST 28 08/30/2014   NA 141 08/30/2014   K 3.8 08/30/2014   CL 107 08/30/2014   CREATININE 0.7 08/30/2014   BUN 11 08/30/2014   CO2 24 08/30/2014   TSH 1.50 11/07/2010       Assessment & Plan:   Problem List Items Addressed This Visit  None    Visit Diagnoses    Hyperlipidemia    -  Primary    Relevant Orders    Basic metabolic panel    Hepatic function panel    Lipid panel       I am having Ms. Dwight maintain her aspirin, calcium carbonate, multivitamin, fish oil-omega-3 fatty acids, (Flaxseed, Linseed, (FLAXSEED OIL PO)), triamcinolone, levocetirizine, and simvastatin.  No orders of the defined types were placed in this encounter.     Garnet Koyanagi, DO

## 2015-03-04 NOTE — Progress Notes (Signed)
Pre visit review using our clinic review tool, if applicable. No additional management support is needed unless otherwise documented below in the visit note. 

## 2015-03-04 NOTE — Patient Instructions (Signed)

## 2015-03-30 DIAGNOSIS — R0789 Other chest pain: Secondary | ICD-10-CM | POA: Diagnosis not present

## 2015-03-30 DIAGNOSIS — W19XXXA Unspecified fall, initial encounter: Secondary | ICD-10-CM | POA: Diagnosis not present

## 2015-03-30 DIAGNOSIS — M542 Cervicalgia: Secondary | ICD-10-CM | POA: Diagnosis not present

## 2015-03-30 DIAGNOSIS — Z043 Encounter for examination and observation following other accident: Secondary | ICD-10-CM | POA: Diagnosis not present

## 2015-03-30 DIAGNOSIS — W01198A Fall on same level from slipping, tripping and stumbling with subsequent striking against other object, initial encounter: Secondary | ICD-10-CM | POA: Diagnosis not present

## 2015-03-30 DIAGNOSIS — S0083XA Contusion of other part of head, initial encounter: Secondary | ICD-10-CM | POA: Diagnosis not present

## 2015-04-04 ENCOUNTER — Ambulatory Visit (INDEPENDENT_AMBULATORY_CARE_PROVIDER_SITE_OTHER): Payer: Medicare PPO | Admitting: Family Medicine

## 2015-04-04 ENCOUNTER — Encounter: Payer: Self-pay | Admitting: Family Medicine

## 2015-04-04 VITALS — BP 122/84 | HR 81 | Temp 98.3°F | Wt 137.0 lb

## 2015-04-04 DIAGNOSIS — W1809XA Striking against other object with subsequent fall, initial encounter: Secondary | ICD-10-CM

## 2015-04-04 DIAGNOSIS — M545 Low back pain, unspecified: Secondary | ICD-10-CM

## 2015-04-04 DIAGNOSIS — M546 Pain in thoracic spine: Secondary | ICD-10-CM

## 2015-04-04 DIAGNOSIS — W1800XA Striking against unspecified object with subsequent fall, initial encounter: Secondary | ICD-10-CM | POA: Insufficient documentation

## 2015-04-04 DIAGNOSIS — W19XXXA Unspecified fall, initial encounter: Secondary | ICD-10-CM | POA: Insufficient documentation

## 2015-04-04 NOTE — Patient Instructions (Signed)

## 2015-04-04 NOTE — Progress Notes (Signed)
Pre visit review using our clinic review tool, if applicable. No additional management support is needed unless otherwise documented below in the visit note. 

## 2015-04-04 NOTE — Addendum Note (Signed)
Addended by: Ewing Schlein on: 04/04/2015 02:41 PM   Modules accepted: Orders

## 2015-04-04 NOTE — Progress Notes (Signed)
Patient ID: Lindsey Patel, female    DOB: [birth year]  Age: 81 y.o. MRN: [id number]    Subjective:  Subjective HPI Lindsey Patel presents for f/u from fall Wed afternoon in [place] while at grandsons graduation.  No LOC.  A neurologist was there and questioned her but she was taken to ER to be evaluated.  Records from [facility] reviewed.   ? Fracture Lumbar / thoracic spine  Review of Systems  Constitutional: Negative for activity change, appetite change, fatigue and unexpected weight change.  Respiratory: Negative for cough and shortness of breath.   Cardiovascular: Negative for chest pain and palpitations.  Genitourinary: Negative for difficulty urinating.  Musculoskeletal: Positive for back pain. Negative for myalgias, joint swelling, arthralgias, gait problem, neck pain and neck stiffness.  Skin:       + bruising R shoulder and l hip  Neurological: Negative for dizziness, weakness, light-headedness, numbness and headaches.  Psychiatric/Behavioral: Negative for behavioral problems and dysphoric mood. The patient is not nervous/anxious.     History Past Medical History  Diagnosis Date  . Hyperlipidemia   . Osteoporosis   . Leukemia     She has past surgical history that includes Tubal ligation; Cataract extraction (6/03); and Cataract extraction (4/09).   Her family history includes Alzheimer's disease in her sister and another family member; Cancer in her brother and father; Dementia in an other family member; Heart disease in her brother; Parkinsonism in her brother and another family member; Stroke in her mother and another family member.She reports that she quit smoking about 51 years ago. Her smoking use included Cigarettes. She has a 2 pack-year smoking history. She has never used smokeless tobacco. She reports that she does not drink alcohol or use illicit drugs.  Current Outpatient Prescriptions on File Prior to Visit  Medication Sig Dispense Refill  . aspirin 81 MG tablet Take  81 mg by mouth daily.    . calcium carbonate (OS-CAL) 600 MG TABS Take 600 mg by mouth 2 (two) times daily with a meal.    . fish oil-omega-3 fatty acids 1000 MG capsule Take 2 g by mouth 2 (two) times daily.     . Flaxseed, Linseed, (FLAXSEED OIL PO) Take 1,400 mg by mouth daily.    . Multiple Vitamin (MULTIVITAMIN) tablet Take 1 tablet by mouth daily.    . simvastatin (ZOCOR) 40 MG tablet TAKE 1 TABLET BY MOUTH EVERY NIGHT AT BEDTIME 90 tablet 1   No current facility-administered medications on file prior to visit.     Objective:  Objective Physical Exam  Constitutional: She is oriented to person, place, and time. She appears well-developed and well-nourished.  HENT:  Head: Normocephalic and atraumatic.  Eyes: Conjunctivae and EOM are normal.  Neck: Normal range of motion. Neck supple. No JVD present. Carotid bruit is not present. No thyromegaly present.  Cardiovascular: Normal rate, regular rhythm and normal heart sounds.   No murmur heard. Pulmonary/Chest: Effort normal and breath sounds normal. No respiratory distress. She has no wheezes. She has no rales. She exhibits no tenderness.  Musculoskeletal: She exhibits tenderness. She exhibits no edema.  Some pain R shoulder--- + eccymosis,  No decrease in rom + bruise L hip-- no pain with weight bearing , walking She does have some low back pain-- no bruising low back No radiation of pain  Neurological: She is alert and oriented to person, place, and time. She has normal reflexes. She displays normal reflexes. No cranial nerve deficit. She exhibits normal  muscle tone.  Psychiatric: She has a normal mood and affect. Her behavior is normal. Judgment and thought content normal.   BP 122/84 mmHg  Pulse 81  Temp(Src) 98.3 F (36.8 C) (Oral)  Wt 137 lb (62.143 kg)  SpO2 97% Wt Readings from Last 3 Encounters:  04/04/15 137 lb (62.143 kg)  03/04/15 135 lb 6.4 oz (61.417 kg)  08/30/14 139 lb (63.05 kg)     Lab Results  Component  Value Date   WBC 19.7* 07/12/2014   HGB 13.8 07/12/2014   HCT 41.7 07/12/2014   PLT 221 07/12/2014   GLUCOSE 92 03/04/2015   CHOL 154 03/04/2015   TRIG 127.0 03/04/2015   HDL 62.60 03/04/2015   LDLDIRECT 93.9 12/08/2008   LDLCALC 66 03/04/2015   ALT 13 03/04/2015   AST 21 03/04/2015   NA 136 03/04/2015   K 4.1 03/04/2015   CL 101 03/04/2015   CREATININE 0.58 03/04/2015   BUN 10 03/04/2015   CO2 30 03/04/2015   TSH 1.50 11/07/2010    No results found.   Assessment & Plan:  Plan I have discontinued Ms. Cromley's triamcinolone and levocetirizine. I am also having her maintain her aspirin, calcium carbonate, multivitamin, fish oil-omega-3 fatty acids, (Flaxseed, Linseed, (FLAXSEED OIL PO)), and simvastatin.  No orders of the defined types were placed in this encounter.    Problem List Items Addressed This Visit    Fall against object - Primary    Ct ordered per radiology report from [facility]-- see care everywhere Pt improving daily rto as scheduled or sooner prn       Other Visit Diagnoses    Thoracic spine pain        Relevant Orders    CT T Spine Ltd Wo Or W/ Cm    Midline low back pain without sciatica        Relevant Orders    CT Lumbar Spine Wo Contrast       Follow-up: Return if symptoms worsen or fail to improve, for as scheduled.  Garnet Koyanagi, DO

## 2015-04-04 NOTE — Assessment & Plan Note (Signed)
Ct ordered per radiology report from [facility]-- see care everywhere Pt improving daily rto as scheduled or sooner prn

## 2015-04-08 ENCOUNTER — Ambulatory Visit (HOSPITAL_BASED_OUTPATIENT_CLINIC_OR_DEPARTMENT_OTHER)
Admission: RE | Admit: 2015-04-08 | Discharge: 2015-04-08 | Disposition: A | Discharge status: Home / Self Care | Payer: Medicare PPO | Source: Ambulatory Visit | Attending: Family Medicine | Admitting: Family Medicine

## 2015-04-08 DIAGNOSIS — M546 Pain in thoracic spine: Secondary | ICD-10-CM

## 2015-04-08 DIAGNOSIS — M549 Dorsalgia, unspecified: Secondary | ICD-10-CM | POA: Diagnosis present

## 2015-04-08 DIAGNOSIS — M5136 Other intervertebral disc degeneration, lumbar region: Secondary | ICD-10-CM | POA: Diagnosis not present

## 2015-04-08 DIAGNOSIS — M5126 Other intervertebral disc displacement, lumbar region: Secondary | ICD-10-CM | POA: Diagnosis not present

## 2015-04-08 DIAGNOSIS — M4806 Spinal stenosis, lumbar region: Secondary | ICD-10-CM | POA: Diagnosis not present

## 2015-04-08 DIAGNOSIS — M545 Low back pain, unspecified: Secondary | ICD-10-CM

## 2015-04-08 DIAGNOSIS — M4807 Spinal stenosis, lumbosacral region: Secondary | ICD-10-CM | POA: Diagnosis not present

## 2015-04-08 DIAGNOSIS — S3992XA Unspecified injury of lower back, initial encounter: Secondary | ICD-10-CM | POA: Diagnosis not present

## 2015-04-08 DIAGNOSIS — S299XXA Unspecified injury of thorax, initial encounter: Secondary | ICD-10-CM | POA: Diagnosis not present

## 2015-04-08 DIAGNOSIS — W1809XA Striking against other object with subsequent fall, initial encounter: Secondary | ICD-10-CM

## 2015-04-15 ENCOUNTER — Other Ambulatory Visit: Payer: Self-pay

## 2015-04-15 DIAGNOSIS — M1990 Unspecified osteoarthritis, unspecified site: Secondary | ICD-10-CM

## 2015-04-15 DIAGNOSIS — M48061 Spinal stenosis, lumbar region without neurogenic claudication: Secondary | ICD-10-CM

## 2015-04-22 DIAGNOSIS — S335XXA Sprain of ligaments of lumbar spine, initial encounter: Secondary | ICD-10-CM | POA: Diagnosis not present

## 2015-04-22 DIAGNOSIS — M5136 Other intervertebral disc degeneration, lumbar region: Secondary | ICD-10-CM | POA: Diagnosis not present

## 2015-04-22 DIAGNOSIS — M545 Low back pain: Secondary | ICD-10-CM | POA: Diagnosis not present

## 2015-05-04 ENCOUNTER — Ambulatory Visit: Payer: Medicare PPO | Attending: Orthopedic Surgery | Admitting: Physical Therapy

## 2015-05-04 ENCOUNTER — Encounter: Payer: Self-pay | Admitting: Physical Therapy

## 2015-05-04 DIAGNOSIS — M545 Low back pain, unspecified: Secondary | ICD-10-CM

## 2015-05-04 DIAGNOSIS — R2689 Other abnormalities of gait and mobility: Secondary | ICD-10-CM

## 2015-05-04 NOTE — Therapy (Signed)
[place] [address] [place], [place], [address] Phone: [phone]   Fax:  [phone]  Physical Therapy Evaluation  Patient Details  Name: Lindsey Patel MRN: [id number] Date of Birth: 25-Feb-1927 Referring Provider:  Gaynelle Arabian, MD  Encounter Date: 05/04/2015      PT End of Session - 05/04/15 1146    Visit Number 1   Date for PT Re-Evaluation 06/04/15   PT Start Time 1055   PT Stop Time 1140   PT Time Calculation (min) 45 min   Activity Tolerance Patient tolerated treatment well   Behavior During Therapy [facility] for tasks assessed/performed      Past Medical History  Diagnosis Date  . Hyperlipidemia   . Osteoporosis   . Leukemia     Past Surgical History  Procedure Laterality Date  . Tubal ligation    . Cataract extraction  6/03    left  . Cataract extraction  4/09    right    There were no vitals filed for this visit.  Visit Diagnosis:  Left-sided low back pain without sciatica - Plan: PT plan of care cert/re-cert  Imbalance - Plan: PT plan of care cert/re-cert      Subjective Assessment - 05/04/15 1058    Subjective Pt states she was at her grandson's graduation and fell in the auditorium going down a step, didn't see the last step and caught herself on the wall, but did state she had an abrasion on her head.  She was evaluated by a neurologist at that time who was in attendance and was taken by ambulance to the ER where they performed imaging of thoracic and lumbar spine.  Pt states she feels really good but has some Left sided low back pain.  She is wondering if she really needs to be seen by [place].     Patient Stated Goals Get some exercises to do for HEP, gain confidence in stairs, walking, stability,    Currently in Pain? No/denies            [facility] PT Assessment - 05/04/15 0001    Assessment   Medical Diagnosis 03/30/15   Onset Date/Surgical Date 03/30/15   Next MD Visit --  not until  semi-annual exam   Balance Screen   Has the patient fallen in the past 6 months Yes   How many times? 1  previously at thanksgiving   Has the patient had a decrease in activity level because of a fear of falling?  Yes  only that she didn't want to damage anything    Is the patient reluctant to leave their home because of a fear of falling?  No   Home Ecologist residence   [place] to enter   Entrance Stairs-Number of Steps 5   Entrance Stairs-Rails Right   [place] One level  13 steps to deck in back   Prior Function   Level of [place] Retired   Leisure reading, walking, get out and around, grandchildren   Mining engineer Comments forward head, slight forward trunk flexion   ROM / Strength   AROM / PROM / Strength AROM;Strength   AROM   Overall AROM Comments trunk flexion/ext/SB WFL, slight horizontal pain across lumbar spine with ext   Strength   Overall Strength Comments hip flexion/add/abd 5/5, knee flex/ext 5/5, DF/PF/Ever/INV 5/5  Ambulation/Gait   Gait Comments slow gait   Balance   Balance Assessed Yes  balance deficits noted with eyes closed and tandem stance                           PT Education - May 10, 2015 1145    Education provided Yes   Education Details Provided patient with education on balance and falls risk.  Gave balance exercises to be performed in a corner with chair for support and balance.     Person(s) Educated Patient   Methods Explanation;Demonstration;Verbal cues;Handout   Comprehension Verbalized understanding;Returned demonstration;Need further instruction;Verbal cues required          PT Short Term Goals - 05/10/2015 1152    PT SHORT TERM GOAL #1   Title Pt will be ind with HEP   Time 2   Period Weeks   Status New           PT Long Term Goals - 2015/05/10 1156    PT LONG TERM GOAL #1   Title Pt  will perform balance exercises in tandem stance for greater than 15 sec to decrease her risk of falls   PT LONG TERM GOAL #2   Title Pt will be ind with advanced balance HEP program   Time 4   Period Weeks   Status New   PT LONG TERM GOAL #3   Title Pt will be adept in correct postural positioning to ensure back health   Time 4   Period Weeks   Status New               Plan - 2015-05-10 1146    Clinical Impression Statement Pt presents to outpatient rehab following a fall at her son's graduation on june 8th.  She was evaluated at the time of the fall by a neurologist who was in attendance at the graduation and she was taken by ambulance for precautionary measures.  Ct scan of lumbar and thoracic spine were performed which noted DDD and some slight bulging.  Pt has no limitations in ADL's but did note that she hasn't done too much since the fall because she didn't want to make anything worse or create a bigger problem.  She staes she has slight left sided SI area pain but it does not limit her functionality.  Pt has great ROM in the trunk in all directions with slight pain in ext.  Her LE strength is 5/5 .  She did display balance deficits with eyes closed and in tandem stance.  Pt would benefit from therapy to improve her balance to decrease her risk of falling.     Pt will benefit from skilled therapeutic intervention in order to improve on the following deficits Decreased balance;Decreased coordination;Decreased endurance;Pain   Rehab Potential Excellent   PT Frequency 1x / week   PT Duration 4 weeks   PT Treatment/Interventions ADLs/Self Care Home Management;Cryotherapy;Electrical Stimulation;Iontophoresis 4mg /ml Dexamethasone;Moist Heat;Ultrasound;Gait training;Stair training;Functional mobility training;Therapeutic activities;Therapeutic exercise;Balance training;Neuromuscular re-education;Patient/family education;Manual techniques;Passive range of motion   PT Next Visit Plan Work on  balance and stabilization exercises, stair navigation, proprioception   PT Home Exercise Plan Balance exercises   Consulted and Agree with Plan of Care Patient          G-Codes - May 10, 2015 1239    Functional Assessment Tool Used FOTO   Functional Limitation Other PT primary   Other PT Primary Current Status (W4132) At least 40 percent but less  than 60 percent impaired, limited or restricted   Other PT Primary Goal Status (U4045) At least 20 percent but less than 40 percent impaired, limited or restricted       Problem List Patient Active Problem List   Diagnosis Date Noted  . Fall against object 04/04/2015  . Anxiety state, unspecified 07/24/2013  . Dyspepsia 07/24/2013  . LEUKEMIA, LYMPHOCYTIC, CHRONIC 09/22/2007  . URI 09/22/2007  . DISEASE, WHITE BLOOD CELL NEC 07/01/2007  . SINUSITIS, ACUTE NOS 06/17/2007  . LEUKOCYTOSIS NOS 05/27/2007  . Hyperlipidemia LDL goal <100 05/07/2007  . OSTEOPOROSIS 05/07/2007    Sumner Boast., PT 05/04/2015, 12:48 PM  [address] [place], [place], [address] Phone: [phone]   Fax:  [phone]

## 2015-05-10 ENCOUNTER — Encounter: Payer: Self-pay | Admitting: Physical Therapy

## 2015-05-10 ENCOUNTER — Ambulatory Visit: Payer: Medicare PPO | Admitting: Physical Therapy

## 2015-05-10 DIAGNOSIS — M545 Low back pain, unspecified: Secondary | ICD-10-CM

## 2015-05-10 DIAGNOSIS — R2689 Other abnormalities of gait and mobility: Secondary | ICD-10-CM | POA: Diagnosis not present

## 2015-05-10 NOTE — Therapy (Signed)
[place], [place], [address] Phone: [phone]   Fax:  [phone]  Physical Therapy Treatment  Patient Details  Name: CLORIA CIRESI MRN: [id number] Date of Birth: 04/09/1927 Referring Provider:  Gaynelle Arabian, MD  Encounter Date: 05/10/2015      PT End of Session - 05/10/15 1152    Visit Number 2   Date for PT Re-Evaluation 06/04/15   PT Start Time 1140   PT Stop Time 1210   PT Time Calculation (min) 30 min      Past Medical History  Diagnosis Date  . Hyperlipidemia   . Osteoporosis   . Leukemia     Past Surgical History  Procedure Laterality Date  . Tubal ligation    . Cataract extraction  6/03    left  . Cataract extraction  4/09    right    There were no vitals filed for this visit.  Visit Diagnosis:  Left-sided low back pain without sciatica  Imbalance      Subjective Assessment - 05/10/15 1138    Subjective feeling really good, HEP is going well   Currently in Pain? No/denies                         [facility] Adult PT Treatment/Exercise - 05/10/15 0001    High Level Balance   High Level Balance Activities Tandem walking;Marching forwards;Marching backwards;Braiding  difficulty with tandem stance-educated to do at home at [facility]   Exercises   Exercises Lumbar;Knee/Hip   Lumbar Exercises: Aerobic   Stationary Bike Nustep L4 5 min   Lumbar Exercises: Machines for Strengthening   Cybex Knee Extension 5# 10 times   Cybex Knee Flexion 15#  10 times   Lumbar Exercises: Standing   Scapular Retraction 10 reps;Both;Theraband  3 ways   Theraband Level (Scapular Retraction) Level 2 (Red)   Lumbar Exercises: Supine   Ab Set 10 reps  2 sets with ball   Other Supine Lumbar Exercises bridge,KTC and obl with ball 10 times 2 sets                PT Education - 05/10/15 1208    Education provided Yes   Education Details HEP tandem and marching, hip  ext,abd and ext all at counter top   Person(s) Educated Patient   Methods Explanation;Demonstration   Comprehension Verbalized understanding;Returned demonstration          PT Short Term Goals - 05/04/15 1152    PT SHORT TERM GOAL #1   Title Pt will be ind with HEP   Time 2   Period Weeks   Status New           PT Long Term Goals - 05/04/15 1156    PT LONG TERM GOAL #1   Title Pt will perform balance exercises in tandem stance for greater than 15 sec to decrease her risk of falls   PT LONG TERM GOAL #2   Title Pt will be ind with advanced balance HEP program   Time 4   Period Weeks   Status New   PT LONG TERM GOAL #3   Title Pt will be adept in correct postural positioning to ensure back health   Time 4   Period Weeks   Status New               Plan - 05/10/15 1153    Clinical Impression  Statement pt tolerated ther ex without any increased pain but fatigued easily. Difficulty with tandem and backward mvmt- HEP issued   PT Next Visit Plan assess how she felt after last seesiona nd progress. BALANCE        Problem List Patient Active Problem List   Diagnosis Date Noted  . Fall against object 04/04/2015  . Anxiety state, unspecified 07/24/2013  . Dyspepsia 07/24/2013  . LEUKEMIA, LYMPHOCYTIC, CHRONIC 09/22/2007  . URI 09/22/2007  . DISEASE, WHITE BLOOD CELL NEC 07/01/2007  . SINUSITIS, ACUTE NOS 06/17/2007  . LEUKOCYTOSIS NOS 05/27/2007  . Hyperlipidemia LDL goal <100 05/07/2007  . OSTEOPOROSIS 05/07/2007    Caitlen Worth,ANGIE PTA 05/10/2015, 12:09 PM  [place] [address] [place], [place], [address] Phone: [phone]   Fax:  [phone]

## 2015-05-12 ENCOUNTER — Ambulatory Visit: Payer: Medicare PPO | Admitting: Physical Therapy

## 2015-05-12 DIAGNOSIS — H01002 Unspecified blepharitis right lower eyelid: Secondary | ICD-10-CM | POA: Diagnosis not present

## 2015-05-12 DIAGNOSIS — H02834 Dermatochalasis of left upper eyelid: Secondary | ICD-10-CM | POA: Diagnosis not present

## 2015-05-12 DIAGNOSIS — H43813 Vitreous degeneration, bilateral: Secondary | ICD-10-CM | POA: Diagnosis not present

## 2015-05-12 DIAGNOSIS — H02831 Dermatochalasis of right upper eyelid: Secondary | ICD-10-CM | POA: Diagnosis not present

## 2015-05-12 DIAGNOSIS — H5211 Myopia, right eye: Secondary | ICD-10-CM | POA: Diagnosis not present

## 2015-05-12 DIAGNOSIS — H01005 Unspecified blepharitis left lower eyelid: Secondary | ICD-10-CM | POA: Diagnosis not present

## 2015-05-12 DIAGNOSIS — Z961 Presence of intraocular lens: Secondary | ICD-10-CM | POA: Diagnosis not present

## 2015-05-13 ENCOUNTER — Ambulatory Visit: Payer: Medicare PPO | Admitting: Physical Therapy

## 2015-05-13 ENCOUNTER — Encounter: Payer: Self-pay | Admitting: Physical Therapy

## 2015-05-13 DIAGNOSIS — M545 Low back pain: Secondary | ICD-10-CM | POA: Diagnosis not present

## 2015-05-13 DIAGNOSIS — R2689 Other abnormalities of gait and mobility: Secondary | ICD-10-CM | POA: Diagnosis not present

## 2015-05-13 NOTE — Therapy (Signed)
[place], [place], [address] Phone: [phone]   Fax:  [phone]  Physical Therapy Treatment  Patient Details  Name: Lindsey Patel MRN: [id number] Date of Birth: 12/05/1926 Referring Provider:  Gaynelle Arabian, MD  Encounter Date: 05/13/2015      PT End of Session - 05/13/15 0955    Visit Number 3   PT Start Time 0930   PT Stop Time 1010   PT Time Calculation (min) 40 min      Past Medical History  Diagnosis Date  . Hyperlipidemia   . Osteoporosis   . Leukemia     Past Surgical History  Procedure Laterality Date  . Tubal ligation    . Cataract extraction  6/03    left  . Cataract extraction  4/09    right    There were no vitals filed for this visit.  Visit Diagnosis:  Imbalance      Subjective Assessment - 05/13/15 0935    Subjective working on HEP, all good but tandem   Currently in Pain? No/denies                         [facility] Adult PT Treatment/Exercise - 05/13/15 0001    Ambulation/Gait   Ambulation/Gait Yes   Ambulation/Gait Assistance 6: Modified independent (Device/Increase time)   Ambulation Distance (Feet) 500 Feet   Assistive device --  none   Ambulation Surface Unlevel;Outdoor;Gravel;Grass;Paved   Gait Comments VCing for curb clearance with LE   High Level Balance   High Level Balance Activities Negotitating around obstacles;Negotiating over obstacles;Direction changes;Sudden stops;Turns  walk outside, ball toss on airex, kicking ball   Lumbar Exercises: Aerobic   Stationary Bike Nustep L5 6 min   Lumbar Exercises: Seated   Sit to Stand 10 reps  2 inch block 5 with UE 5 without   Knee/Hip Exercises: Standing   Forward Step Up Both;2 sets;10 reps  working on strengtha nd foot clearance                  PT Short Term Goals - 05/13/15 0958    PT SHORT TERM GOAL #1   Title Pt will be ind with HEP   Status Achieved            PT Long Term Goals - 05/13/15 0958    PT LONG TERM GOAL #1   Title Pt will perform balance exercises in tandem stance for greater than 15 sec to decrease her risk of falls   Status On-going   PT LONG TERM GOAL #2   Title Pt will be ind with advanced balance HEP program   Status On-going   PT LONG TERM GOAL #3   Title Pt will be adept in correct postural positioning to ensure back health   Status Achieved               Plan - 05/13/15 0956    Clinical Impression Statement pt doing extremely well, only issues with foot clearance with curbs and tandem still difficult   PT Next Visit Plan HIGH LEVEL BALANCE ACTIVITIES. D/C next week after 1 add'l visit        Problem List Patient Active Problem List   Diagnosis Date Noted  . Fall against object 04/04/2015  . Anxiety state, unspecified 07/24/2013  . Dyspepsia 07/24/2013  . LEUKEMIA, LYMPHOCYTIC, CHRONIC 09/22/2007  . URI 09/22/2007  . DISEASE, WHITE  BLOOD CELL NEC 07/01/2007  . SINUSITIS, ACUTE NOS 06/17/2007  . LEUKOCYTOSIS NOS 05/27/2007  . Hyperlipidemia LDL goal <100 05/07/2007  . OSTEOPOROSIS 05/07/2007    PAYSEUR,ANGIE PTA 05/13/2015, 10:02 AM  [address] [place], [place], [address] Phone: [phone]   Fax:  [phone]

## 2015-05-17 ENCOUNTER — Ambulatory Visit: Payer: Medicare PPO | Admitting: Physical Therapy

## 2015-05-19 ENCOUNTER — Encounter: Payer: Self-pay | Admitting: Physical Therapy

## 2015-05-19 ENCOUNTER — Ambulatory Visit: Payer: Medicare PPO | Admitting: Physical Therapy

## 2015-05-19 DIAGNOSIS — M545 Low back pain, unspecified: Secondary | ICD-10-CM

## 2015-05-19 DIAGNOSIS — R2689 Other abnormalities of gait and mobility: Secondary | ICD-10-CM | POA: Diagnosis not present

## 2015-05-19 NOTE — Therapy (Signed)
[place] [address] [place], [place], [address] Phone: [phone]   Fax:  [phone]  Physical Therapy Treatment  Patient Details  Name: Lindsey Patel MRN: [id number] Date of Birth: 06/12/1927 Referring Provider:  Gaynelle Arabian, MD  Encounter Date: 05/19/2015      PT End of Session - 05/19/15 1213    Visit Number 4   Date for PT Re-Evaluation 06/04/15   PT Start Time 1140   PT Stop Time 1220   PT Time Calculation (min) 40 min      Past Medical History  Diagnosis Date  . Hyperlipidemia   . Osteoporosis   . Leukemia     Past Surgical History  Procedure Laterality Date  . Tubal ligation    . Cataract extraction  6/03    left  . Cataract extraction  4/09    right    There were no vitals filed for this visit.  Visit Diagnosis:  Imbalance  Left-sided low back pain without sciatica      Subjective Assessment - 05/19/15 1141    Subjective doing great, minimal LBP with sweeping   Currently in Pain? No/denies                         [facility] Adult PT Treatment/Exercise - 05/19/15 0001    Lumbar Exercises: Aerobic   Stationary Bike Nustep L5 7 min   Lumbar Exercises: Machines for Strengthening   Cybex Knee Extension 5#  3 sets 10   Cybex Knee Flexion 15#  3 sets 10   Lumbar Exercises: Standing   Other Standing Lumbar Exercises sit to stand on airex 10 times min A   Knee/Hip Exercises: Standing   Forward Step Up Both;2 sets;10 reps  working on strengtha nd foot clearance   Forward Step Up Limitations 3# step touch alt 2 0 times 2 sets   Knee/Hip Exercises: Seated   Long Arc Quad Strengthening;Both;1 set;15 reps  3 sec hold   Long Arc Quad Weight 3 lbs.                PT Education - 05/19/15 1149    Education provided Yes   Education Details sweeping and vaccuming BM   Person(s) Educated Patient   Methods Explanation;Demonstration   Comprehension Verbalized  understanding;Returned demonstration          PT Short Term Goals - 05/13/15 0958    PT SHORT TERM GOAL #1   Title Pt will be ind with HEP   Status Achieved           PT Long Term Goals - 05/19/15 1213    PT LONG TERM GOAL #1   Title Pt will perform balance exercises in tandem stance for greater than 15 sec to decrease her risk of falls   Baseline 11 sec   Status On-going   PT LONG TERM GOAL #2   Title Pt will be ind with advanced balance HEP program   Status Achieved   PT LONG TERM GOAL #3   Title Pt will be adept in correct postural positioning to ensure back health   Status Achieved               Plan - 05/19/15 1213    Clinical Impression Statement goals met    PT Home Exercise Plan D/C        Problem List Patient Active Problem List   Diagnosis Date  Noted  . Fall against object 04/04/2015  . Anxiety state, unspecified 07/24/2013  . Dyspepsia 07/24/2013  . LEUKEMIA, LYMPHOCYTIC, CHRONIC 09/22/2007  . URI 09/22/2007  . DISEASE, WHITE BLOOD CELL NEC 07/01/2007  . SINUSITIS, ACUTE NOS 06/17/2007  . LEUKOCYTOSIS NOS 05/27/2007  . Hyperlipidemia LDL goal <100 05/07/2007  . OSTEOPOROSIS 05/07/2007    Jelena Malicoat,ANGIE PTA 05/19/2015, 12:24 PM  [place] [address] [place], [place], [address] Phone: [phone]   Fax:  [phone]

## 2015-05-25 DIAGNOSIS — M545 Low back pain: Secondary | ICD-10-CM | POA: Diagnosis not present

## 2015-05-25 DIAGNOSIS — M5136 Other intervertebral disc degeneration, lumbar region: Secondary | ICD-10-CM | POA: Diagnosis not present

## 2015-06-30 DIAGNOSIS — Z1231 Encounter for screening mammogram for malignant neoplasm of breast: Secondary | ICD-10-CM | POA: Diagnosis not present

## 2015-06-30 LAB — HM MAMMOGRAPHY

## 2015-07-04 ENCOUNTER — Telehealth: Payer: Self-pay | Admitting: Hematology

## 2015-07-04 NOTE — Telephone Encounter (Signed)
Moved 9/21 appointment from [facility] to [place]. Spoke with patient she is aware of lab/GK 9/21 @ 11:30 am.

## 2015-07-13 ENCOUNTER — Other Ambulatory Visit (HOSPITAL_BASED_OUTPATIENT_CLINIC_OR_DEPARTMENT_OTHER): Payer: Medicare PPO

## 2015-07-13 ENCOUNTER — Telehealth: Payer: Self-pay | Admitting: Hematology

## 2015-07-13 ENCOUNTER — Encounter: Payer: Self-pay | Admitting: Hematology

## 2015-07-13 ENCOUNTER — Other Ambulatory Visit (HOSPITAL_COMMUNITY)
Admission: RE | Admit: 2015-07-13 | Discharge: 2015-07-13 | Disposition: A | Discharge status: Home / Self Care | Payer: Medicare PPO | Source: Ambulatory Visit | Attending: Hematology | Admitting: Hematology

## 2015-07-13 ENCOUNTER — Ambulatory Visit (HOSPITAL_BASED_OUTPATIENT_CLINIC_OR_DEPARTMENT_OTHER): Payer: Medicare PPO | Admitting: Hematology

## 2015-07-13 VITALS — BP 163/77 | HR 82 | Temp 97.6°F | Resp 18 | Ht 62.0 in | Wt 132.3 lb

## 2015-07-13 DIAGNOSIS — Z23 Encounter for immunization: Secondary | ICD-10-CM | POA: Diagnosis not present

## 2015-07-13 DIAGNOSIS — C9110 Chronic lymphocytic leukemia of B-cell type not having achieved remission: Secondary | ICD-10-CM

## 2015-07-13 DIAGNOSIS — Z856 Personal history of leukemia: Secondary | ICD-10-CM | POA: Diagnosis not present

## 2015-07-13 LAB — CBC & DIFF AND RETIC
BASO%: 0.2 % (ref 0.0–2.0)
Basophils Absolute: 0 10*3/uL (ref 0.0–0.1)
EOS%: 1.5 % (ref 0.0–7.0)
Eosinophils Absolute: 0.3 10*3/uL (ref 0.0–0.5)
HCT: 41.5 % (ref 34.8–46.6)
HGB: 13.9 g/dL (ref 11.6–15.9)
Immature Retic Fract: 4.1 % (ref 1.60–10.00)
LYMPH#: 12.8 10*3/uL — AB (ref 0.9–3.3)
LYMPH%: 72 % — ABNORMAL HIGH (ref 14.0–49.7)
MCH: 30.9 pg (ref 25.1–34.0)
MCHC: 33.5 g/dL (ref 31.5–36.0)
MCV: 92.2 fL (ref 79.5–101.0)
MONO#: 0.6 10*3/uL (ref 0.1–0.9)
MONO%: 3.5 % (ref 0.0–14.0)
NEUT#: 4.1 10*3/uL (ref 1.5–6.5)
NEUT%: 22.8 % — ABNORMAL LOW (ref 38.4–76.8)
Platelets: 193 10*3/uL (ref 145–400)
RBC: 4.5 10*6/uL (ref 3.70–5.45)
RDW: 13.4 % (ref 11.2–14.5)
RETIC %: 1.41 % (ref 0.70–2.10)
RETIC CT ABS: 63.45 10*3/uL (ref 33.70–90.70)
WBC: 17.8 10*3/uL — ABNORMAL HIGH (ref 3.9–10.3)

## 2015-07-13 LAB — COMPREHENSIVE METABOLIC PANEL (CC13)
ALBUMIN: 4 g/dL (ref 3.5–5.0)
ALK PHOS: 72 U/L (ref 40–150)
ALT: 18 U/L (ref 0–55)
AST: 26 U/L (ref 5–34)
Anion Gap: 8 mEq/L (ref 3–11)
BILIRUBIN TOTAL: 0.42 mg/dL (ref 0.20–1.20)
BUN: 10.3 mg/dL (ref 7.0–26.0)
CALCIUM: 9.4 mg/dL (ref 8.4–10.4)
CO2: 27 mEq/L (ref 22–29)
Chloride: 104 mEq/L (ref 98–109)
Creatinine: 0.7 mg/dL (ref 0.6–1.1)
EGFR: 80 mL/min/{1.73_m2} — ABNORMAL LOW (ref >90)
GLUCOSE: 98 mg/dL (ref 70–140)
Potassium: 4.5 mEq/L (ref 3.5–5.1)
SODIUM: 139 meq/L (ref 136–145)
TOTAL PROTEIN: 6.6 g/dL (ref 6.4–8.3)

## 2015-07-13 LAB — TECHNOLOGIST REVIEW

## 2015-07-13 LAB — CHCC SMEAR

## 2015-07-13 MED ORDER — INFLUENZA VAC SPLIT QUAD 0.5 ML IM SUSY
0.5000 mL | PREFILLED_SYRINGE | Freq: Once | INTRAMUSCULAR | Status: AC
  Administered 2015-07-13: 0.5 mL via INTRAMUSCULAR
  Filled 2015-07-13: qty 0.5

## 2015-07-13 NOTE — Telephone Encounter (Signed)
Pt confirmed labs/ov per 09/21 POF, gave pt AVS and Calendar... KJ °

## 2015-07-15 LAB — FLOW CYTOMETRY

## 2015-07-20 DIAGNOSIS — N812 Incomplete uterovaginal prolapse: Secondary | ICD-10-CM | POA: Diagnosis not present

## 2015-07-20 DIAGNOSIS — L309 Dermatitis, unspecified: Secondary | ICD-10-CM | POA: Diagnosis not present

## 2015-07-20 DIAGNOSIS — Z01419 Encounter for gynecological examination (general) (routine) without abnormal findings: Secondary | ICD-10-CM | POA: Diagnosis not present

## 2015-07-23 ENCOUNTER — Encounter: Payer: Self-pay | Admitting: Hematology

## 2015-07-23 NOTE — Progress Notes (Signed)
Marland Kitchen  HEMATOLOGY ONCOLOGY PROGRESS NOTE  Date of service: 07/13/2015  Patient Care Team: Rosalita Chessman, DO as PCP - General  CC: Follow-up for CLL  Diagnosis: Rai Stage 0 CLL  Current Treatment: Monitoring/Observation.  INTERVAL HISTORY:  [name] is here for her scheduled follow-up of CLL after her last clinic visit with Dr. Lona Kettle  on 07/13/2014. She has not needed treatment thus far. Is here accompanied by her son. Notes no acute new symptoms. No issues with increasing fatigue/night sweats/fevers/chills/frequent infections .no issues with excessive bruising or bleeding. No abdominal pain no chest pain or shortness of breath .   REVIEW OF SYSTEMS:    10 Point review of systems of done and is negative except as noted above.  . Past Medical History  Diagnosis Date  . Hyperlipidemia   . Osteoporosis   . Leukemia ([place])   . Chronic lymphocytic leukemia 09/22/2007    Qualifier: Diagnosis of  By: Jerold Coombe      . Past Surgical History  Procedure Laterality Date  . Tubal ligation    . Cataract extraction  6/03    left  . Cataract extraction  4/09    right    . Social History  Substance Use Topics  . Smoking status: Former Smoker -- 0.50 packs/day for 4 years    Types: Cigarettes    Quit date: 12/13/1963  . Smokeless tobacco: Never Used  . Alcohol Use: No    ALLERGIES:  is allergic to pneumococcal vaccines.  MEDICATIONS:  Current Outpatient Prescriptions  Medication Sig Dispense Refill  . aspirin 81 MG tablet Take 81 mg by mouth daily.    . calcium carbonate (OS-CAL) 600 MG TABS Take 600 mg by mouth 2 (two) times daily with a meal.    . fish oil-omega-3 fatty acids 1000 MG capsule Take 2 g by mouth 2 (two) times daily.     . Flaxseed, Linseed, (FLAXSEED OIL PO) Take 1,400 mg by mouth daily.    . Multiple Vitamin (MULTIVITAMIN) tablet Take 1 tablet by mouth daily.    . simvastatin (ZOCOR) 40 MG tablet TAKE 1 TABLET BY MOUTH EVERY NIGHT AT BEDTIME 90  tablet 1   No current facility-administered medications for this visit.    PHYSICAL EXAMINATION: ECOG PERFORMANCE STATUS: 2 - Symptomatic, <50% confined to bed  Filed Vitals:   07/13/15 1153  BP: 163/77  Pulse: 82  Temp: 97.6 F (36.4 C)  Resp: 18   Filed Weights   07/13/15 1153  Weight: 132 lb 4.8 oz (60.011 kg)   .Body mass index is 24.19 kg/(m^2).  GENERAL:alert, in no acute distress and comfortable SKIN: skin color, texture, turgor are normal, no rashes or significant lesions EYES: normal, conjunctiva are pink and non-injected, sclera clear OROPHARYNX:no exudate, no erythema and lips, buccal mucosa, and tongue normal  NECK: supple, no JVD, thyroid normal size, non-tender, without nodularity LYMPH:  no palpable lymphadenopathy in the cervical, axillary or inguinal LUNGS: clear to auscultation with normal respiratory effort HEART: regular rate & rhythm,  no murmurs and no lower extremity edema ABDOMEN: abdomen soft, non-tender, normoactive bowel sounds . No hepatosplenomegaly.  Musculoskeletal: no cyanosis of digits and no clubbing  PSYCH: alert & oriented x 3 with fluent speech NEURO: no focal motor/sensory deficits  LABORATORY DATA:   I have reviewed the data as listed  . CBC Latest Ref Rng 07/13/2015 07/12/2014 02/26/2014  WBC 3.9 - 10.3 10e3/uL 17.8(H) 19.7(H) 15.8(H)  Hemoglobin 11.6 - 15.9 g/dL  13.9 13.8 13.5  Hematocrit 34.8 - 46.6 % 41.5 41.7 40.9  Platelets 145 - 400 10e3/uL 193 221 221.0    . CMP Latest Ref Rng 07/13/2015 03/04/2015 08/30/2014  Glucose 70 - 140 mg/dl 98 92 80  BUN 7.0 - 26.0 mg/dL 10.3 10 11   Creatinine 0.6 - 1.1 mg/dL 0.7 0.58 0.7  Sodium 136 - 145 mEq/L 139 136 141  Potassium 3.5 - 5.1 mEq/L 4.5 4.1 3.8  Chloride 96 - 112 mEq/L - 101 107  CO2 22 - 29 mEq/L 27 30 24   Calcium 8.4 - 10.4 mg/dL 9.4 9.8 9.6  Total Protein 6.4 - 8.3 g/dL 6.6 6.7 6.5  Total Bilirubin 0.20 - 1.20 mg/dL 0.42 0.4 0.4  Alkaline Phos 40 - 150 U/L 72 70 59  AST 5  - 34 U/L 26 21 28   ALT 0 - 55 U/L 18 13 18     flow cytometry 07/13/2015 :    Peripheral blood smear personally reviewed by me:  Normocytic normochromic red cells. No increased schistocytes. Adequate platelets. No platelet clumping. Increased lymphocytes which appear mature with condensed chromatin. picture consistent with CLL.    RADIOGRAPHIC STUDIES: I have personally reviewed the radiological images as listed and agreed with the findings in the report. No results found.  ASSESSMENT & PLAN:    81 year old Caucasian female with  #1 Rai stage 0 CLL with stable lymphocytosis. No lymphadenopathy. No hepatosplenomegaly. No cytopenias. No constitutional symptoms. Unknown cytogenetics. Plan -Patient has no indications to warrant treatment of her CLL at this time. -No issues with infections to suggest the need for IVIG -Continue follow-up with primary care physician for other cares. -Recommended she get a flu shot with her primary care physician.  Return to clinic in 1 year with Dr. Irene Limbo with CBC with differential and CMP. Earlier if any new questions or concerns arise. Recheck CBC with differential with primary care physician in 6 months  I spent 20 minutes counseling the patient face to face. The total time spent in the appointment was 25 minutes and more than 50% was on counseling and direct patient cares.    Sullivan Lone MD [place] AAHIVMS [facility] Hematology/Oncology Physician [facility]  (Office):       [phone] (Work cell):  [phone] (Fax):           [phone]

## 2015-08-20 ENCOUNTER — Other Ambulatory Visit: Payer: Self-pay | Admitting: Family Medicine

## 2015-09-06 ENCOUNTER — Ambulatory Visit (INDEPENDENT_AMBULATORY_CARE_PROVIDER_SITE_OTHER): Payer: Medicare PPO | Admitting: Family Medicine

## 2015-09-06 ENCOUNTER — Encounter: Payer: Self-pay | Admitting: Family Medicine

## 2015-09-06 VITALS — BP 134/82 | HR 77 | Temp 97.7°F | Ht 62.0 in | Wt 132.0 lb

## 2015-09-06 DIAGNOSIS — Z0000 Encounter for general adult medical examination without abnormal findings: Secondary | ICD-10-CM | POA: Diagnosis not present

## 2015-09-06 DIAGNOSIS — D72829 Elevated white blood cell count, unspecified: Secondary | ICD-10-CM

## 2015-09-06 DIAGNOSIS — E785 Hyperlipidemia, unspecified: Secondary | ICD-10-CM

## 2015-09-06 DIAGNOSIS — R8299 Other abnormal findings in urine: Secondary | ICD-10-CM | POA: Diagnosis not present

## 2015-09-06 DIAGNOSIS — R8290 Unspecified abnormal findings in urine: Secondary | ICD-10-CM

## 2015-09-06 DIAGNOSIS — R82998 Other abnormal findings in urine: Secondary | ICD-10-CM

## 2015-09-06 DIAGNOSIS — N390 Urinary tract infection, site not specified: Secondary | ICD-10-CM | POA: Diagnosis not present

## 2015-09-06 LAB — COMPREHENSIVE METABOLIC PANEL
ALK PHOS: 75 U/L (ref 39–117)
ALT: 15 U/L (ref 0–35)
AST: 23 U/L (ref 0–37)
Albumin: 4.1 g/dL (ref 3.5–5.2)
BILIRUBIN TOTAL: 0.5 mg/dL (ref 0.2–1.2)
BUN: 13 mg/dL (ref 6–23)
CO2: 28 meq/L (ref 19–32)
CREATININE: 0.55 mg/dL (ref 0.40–1.20)
Calcium: 9.9 mg/dL (ref 8.4–10.5)
Chloride: 106 mEq/L (ref 96–112)
GFR: 111.32 mL/min (ref >60.00)
GLUCOSE: 97 mg/dL (ref 70–99)
Potassium: 4.2 mEq/L (ref 3.5–5.1)
SODIUM: 142 meq/L (ref 135–145)
TOTAL PROTEIN: 6.3 g/dL (ref 6.0–8.3)

## 2015-09-06 LAB — CBC WITH DIFFERENTIAL/PLATELET
BASOS ABS: 0.1 10*3/uL (ref 0.0–0.1)
Basophils Relative: 0.4 % (ref 0.0–3.0)
EOS ABS: 0.2 10*3/uL (ref 0.0–0.7)
Eosinophils Relative: 1.5 % (ref 0.0–5.0)
HCT: 41.7 % (ref 36.0–46.0)
Hemoglobin: 13.5 g/dL (ref 12.0–15.0)
LYMPHS ABS: 10.4 10*3/uL — AB (ref 0.7–4.0)
Lymphocytes Relative: 65.4 % — ABNORMAL HIGH (ref 12.0–46.0)
MCHC: 32.4 g/dL (ref 30.0–36.0)
MCV: 93.9 fl (ref 78.0–100.0)
MONO ABS: 0.6 10*3/uL (ref 0.1–1.0)
Monocytes Relative: 4 % (ref 3.0–12.0)
NEUTROS ABS: 4.6 10*3/uL (ref 1.4–7.7)
NEUTROS PCT: 28.7 % — AB (ref 43.0–77.0)
PLATELETS: 212 10*3/uL (ref 150.0–400.0)
RBC: 4.45 Mil/uL (ref 3.87–5.11)
RDW: 13.3 % (ref 11.5–15.5)
WBC: 15.9 10*3/uL — ABNORMAL HIGH (ref 4.0–10.5)

## 2015-09-06 LAB — LIPID PANEL
CHOL/HDL RATIO: 2
Cholesterol: 137 mg/dL (ref 0–200)
HDL: 55.6 mg/dL (ref >39.00)
LDL CALC: 62 mg/dL (ref 0–99)
NonHDL: 81.14
TRIGLYCERIDES: 95 mg/dL (ref 0.0–149.0)
VLDL: 19 mg/dL (ref 0.0–40.0)

## 2015-09-06 LAB — POCT URINALYSIS DIPSTICK
BILIRUBIN UA: NEGATIVE
GLUCOSE UA: NEGATIVE
Ketones, UA: NEGATIVE
Nitrite, UA: NEGATIVE
Protein, UA: NEGATIVE
RBC UA: NEGATIVE
SPEC GRAV UA: 1.02
Urobilinogen, UA: 0.2
pH, UA: 6.5

## 2015-09-06 NOTE — Patient Instructions (Signed)
Preventive Care for Adults, Female A healthy lifestyle and preventive care can promote health and wellness. Preventive health guidelines for women include the following key practices.  A routine yearly physical is a good way to check with your health care provider about your health and preventive screening. It is a chance to share any concerns and updates on your health and to receive a thorough exam.  Visit your dentist for a routine exam and preventive care every 6 months. Brush your teeth twice a day and floss once a day. Good oral hygiene prevents tooth decay and gum disease.  The frequency of eye exams is based on your age, health, family medical history, use of contact lenses, and other factors. Follow your health care provider's recommendations for frequency of eye exams.  Eat a healthy diet. Foods like vegetables, fruits, whole grains, low-fat dairy products, and lean protein foods contain the nutrients you need without too many calories. Decrease your intake of foods high in solid fats, added sugars, and salt. Eat the right amount of calories for you.Get information about a proper diet from your health care provider, if necessary.  Regular physical exercise is one of the most important things you can do for your health. Most adults should get at least 150 minutes of moderate-intensity exercise (any activity that increases your heart rate and causes you to sweat) each week. In addition, most adults need muscle-strengthening exercises on 2 or more days a week.  Maintain a healthy weight. The body mass index (BMI) is a screening tool to identify possible weight problems. It provides an estimate of body fat based on height and weight. Your health care provider can find your BMI and can help you achieve or maintain a healthy weight.For adults 20 years and older:  A BMI below 18.5 is considered underweight.  A BMI of 18.5 to 24.9 is normal.  A BMI of 25 to 29.9 is considered overweight.  A  BMI of 30 and above is considered obese.  Maintain normal blood lipids and cholesterol levels by exercising and minimizing your intake of saturated fat. Eat a balanced diet with plenty of fruit and vegetables. Blood tests for lipids and cholesterol should begin at age 45 and be repeated every 5 years. If your lipid or cholesterol levels are high, you are over 50, or you are at high risk for heart disease, you may need your cholesterol levels checked more frequently.Ongoing high lipid and cholesterol levels should be treated with medicines if diet and exercise are not working.  If you smoke, find out from your health care provider how to quit. If you do not use tobacco, do not start.  Lung cancer screening is recommended for adults aged 45-80 years who are at high risk for developing lung cancer because of a history of smoking. A yearly low-dose CT scan of the lungs is recommended for people who have at least a 30-pack-year history of smoking and are a current smoker or have quit within the past 15 years. A pack year of smoking is smoking an average of 1 pack of cigarettes a day for 1 year (for example: 1 pack a day for 30 years or 2 packs a day for 15 years). Yearly screening should continue until the smoker has stopped smoking for at least 15 years. Yearly screening should be stopped for people who develop a health problem that would prevent them from having lung cancer treatment.  If you are pregnant, do not drink alcohol. If you are  breastfeeding, be very cautious about drinking alcohol. If you are not pregnant and choose to drink alcohol, do not have more than 1 drink per day. One drink is considered to be 12 ounces (355 mL) of beer, 5 ounces (148 mL) of wine, or 1.5 ounces (44 mL) of liquor.  Avoid use of street drugs. Do not share needles with anyone. Ask for help if you need support or instructions about stopping the use of drugs.  High blood pressure causes heart disease and increases the risk  of stroke. Your blood pressure should be checked at least every 1 to 2 years. Ongoing high blood pressure should be treated with medicines if weight loss and exercise do not work.  If you are 55-79 years old, ask your health care provider if you should take aspirin to prevent strokes.  Diabetes screening is done by taking a blood sample to check your blood glucose level after you have not eaten for a certain period of time (fasting). If you are not overweight and you do not have risk factors for diabetes, you should be screened once every 3 years starting at age 45. If you are overweight or obese and you are 40-70 years of age, you should be screened for diabetes every year as part of your cardiovascular risk assessment.  Breast cancer screening is essential preventive care for women. You should practice "breast self-awareness." This means understanding the normal appearance and feel of your breasts and may include breast self-examination. Any changes detected, no matter how small, should be reported to a health care provider. Women in their 20s and 30s should have a clinical breast exam (CBE) by a health care provider as part of a regular health exam every 1 to 3 years. After age 40, women should have a CBE every year. Starting at age 40, women should consider having a mammogram (breast X-ray test) every year. Women who have a family history of breast cancer should talk to their health care provider about genetic screening. Women at a high risk of breast cancer should talk to their health care providers about having an MRI and a mammogram every year.  Breast cancer gene (BRCA)-related cancer risk assessment is recommended for women who have family members with BRCA-related cancers. BRCA-related cancers include breast, ovarian, tubal, and peritoneal cancers. Having family members with these cancers may be associated with an increased risk for harmful changes (mutations) in the breast cancer genes BRCA1 and  BRCA2. Results of the assessment will determine the need for genetic counseling and BRCA1 and BRCA2 testing.  Your health care provider may recommend that you be screened regularly for cancer of the pelvic organs (ovaries, uterus, and vagina). This screening involves a pelvic examination, including checking for microscopic changes to the surface of your cervix (Pap test). You may be encouraged to have this screening done every 3 years, beginning at age 21.  For women ages 30-65, health care providers may recommend pelvic exams and Pap testing every 3 years, or they may recommend the Pap and pelvic exam, combined with testing for human papilloma virus (HPV), every 5 years. Some types of HPV increase your risk of cervical cancer. Testing for HPV may also be done on women of any age with unclear Pap test results.  Other health care providers may not recommend any screening for nonpregnant women who are considered low risk for pelvic cancer and who do not have symptoms. Ask your health care provider if a screening pelvic exam is right for   you.  If you have had past treatment for cervical cancer or a condition that could lead to cancer, you need Pap tests and screening for cancer for at least 20 years after your treatment. If Pap tests have been discontinued, your risk factors (such as having a new sexual partner) need to be reassessed to determine if screening should resume. Some women have medical problems that increase the chance of getting cervical cancer. In these cases, your health care provider may recommend more frequent screening and Pap tests.  Colorectal cancer can be detected and often prevented. Most routine colorectal cancer screening begins at the age of 50 years and continues through age 75 years. However, your health care provider may recommend screening at an earlier age if you have risk factors for colon cancer. On a yearly basis, your health care provider may provide home test kits to check  for hidden blood in the stool. Use of a small camera at the end of a tube, to directly examine the colon (sigmoidoscopy or colonoscopy), can detect the earliest forms of colorectal cancer. Talk to your health care provider about this at age 50, when routine screening begins. Direct exam of the colon should be repeated every 5-10 years through age 75 years, unless early forms of precancerous polyps or small growths are found.  People who are at an increased risk for hepatitis B should be screened for this virus. You are considered at high risk for hepatitis B if:  You were born in a country where hepatitis B occurs often. Talk with your health care provider about which countries are considered high risk.  Your parents were born in a high-risk country and you have not received a shot to protect against hepatitis B (hepatitis B vaccine).  You have HIV or AIDS.  You use needles to inject street drugs.  You live with, or have sex with, someone who has hepatitis B.  You get hemodialysis treatment.  You take certain medicines for conditions like cancer, organ transplantation, and autoimmune conditions.  Hepatitis C blood testing is recommended for all people born from 1945 through 1965 and any individual with known risks for hepatitis C.  Practice safe sex. Use condoms and avoid high-risk sexual practices to reduce the spread of sexually transmitted infections (STIs). STIs include gonorrhea, chlamydia, syphilis, trichomonas, herpes, HPV, and human immunodeficiency virus (HIV). Herpes, HIV, and HPV are viral illnesses that have no cure. They can result in disability, cancer, and death.  You should be screened for sexually transmitted illnesses (STIs) including gonorrhea and chlamydia if:  You are sexually active and are younger than 24 years.  You are older than 24 years and your health care provider tells you that you are at risk for this type of infection.  Your sexual activity has changed  since you were last screened and you are at an increased risk for chlamydia or gonorrhea. Ask your health care provider if you are at risk.  If you are at risk of being infected with HIV, it is recommended that you take a prescription medicine daily to prevent HIV infection. This is called preexposure prophylaxis (PrEP). You are considered at risk if:  You are sexually active and do not regularly use condoms or know the HIV status of your partner(s).  You take drugs by injection.  You are sexually active with a partner who has HIV.  Talk with your health care provider about whether you are at high risk of being infected with HIV. If   you choose to begin PrEP, you should first be tested for HIV. You should then be tested every 3 months for as long as you are taking PrEP.  Osteoporosis is a disease in which the bones lose minerals and strength with aging. This can result in serious bone fractures or breaks. The risk of osteoporosis can be identified using a bone density scan. Women ages 67 years and over and women at risk for fractures or osteoporosis should discuss screening with their health care providers. Ask your health care provider whether you should take a calcium supplement or vitamin D to reduce the rate of osteoporosis.  Menopause can be associated with physical symptoms and risks. Hormone replacement therapy is available to decrease symptoms and risks. You should talk to your health care provider about whether hormone replacement therapy is right for you.  Use sunscreen. Apply sunscreen liberally and repeatedly throughout the day. You should seek shade when your shadow is shorter than you. Protect yourself by wearing long sleeves, pants, a wide-brimmed hat, and sunglasses year round, whenever you are outdoors.  Once a month, do a whole body skin exam, using a mirror to look at the skin on your back. Tell your health care provider of new moles, moles that have irregular borders, moles that  are larger than a pencil eraser, or moles that have changed in shape or color.  Stay current with required vaccines (immunizations).  Influenza vaccine. All adults should be immunized every year.  Tetanus, diphtheria, and acellular pertussis (Td, Tdap) vaccine. Pregnant women should receive 1 dose of Tdap vaccine during each pregnancy. The dose should be obtained regardless of the length of time since the last dose. Immunization is preferred during the 27th-36th week of gestation. An adult who has not previously received Tdap or who does not know her vaccine status should receive 1 dose of Tdap. This initial dose should be followed by tetanus and diphtheria toxoids (Td) booster doses every 10 years. Adults with an unknown or incomplete history of completing a 3-dose immunization series with Td-containing vaccines should begin or complete a primary immunization series including a Tdap dose. Adults should receive a Td booster every 10 years.  Varicella vaccine. An adult without evidence of immunity to varicella should receive 2 doses or a second dose if she has previously received 1 dose. Pregnant females who do not have evidence of immunity should receive the first dose after pregnancy. This first dose should be obtained before leaving the health care facility. The second dose should be obtained 4-8 weeks after the first dose.  Human papillomavirus (HPV) vaccine. Females aged 13-26 years who have not received the vaccine previously should obtain the 3-dose series. The vaccine is not recommended for use in pregnant females. However, pregnancy testing is not needed before receiving a dose. If a female is found to be pregnant after receiving a dose, no treatment is needed. In that case, the remaining doses should be delayed until after the pregnancy. Immunization is recommended for any person with an immunocompromised condition through the age of 61 years if she did not get any or all doses earlier. During the  3-dose series, the second dose should be obtained 4-8 weeks after the first dose. The third dose should be obtained 24 weeks after the first dose and 16 weeks after the second dose.  Zoster vaccine. One dose is recommended for adults aged 30 years or older unless certain conditions are present.  Measles, mumps, and rubella (MMR) vaccine. Adults born  before 1957 generally are considered immune to measles and mumps. Adults born in 1957 or later should have 1 or more doses of MMR vaccine unless there is a contraindication to the vaccine or there is laboratory evidence of immunity to each of the three diseases. A routine second dose of MMR vaccine should be obtained at least 28 days after the first dose for students attending postsecondary schools, health care workers, or international travelers. People who received inactivated measles vaccine or an unknown type of measles vaccine during 1963-1967 should receive 2 doses of MMR vaccine. People who received inactivated mumps vaccine or an unknown type of mumps vaccine before 1979 and are at high risk for mumps infection should consider immunization with 2 doses of MMR vaccine. For females of childbearing age, rubella immunity should be determined. If there is no evidence of immunity, females who are not pregnant should be vaccinated. If there is no evidence of immunity, females who are pregnant should delay immunization until after pregnancy. Unvaccinated health care workers born before 1957 who lack laboratory evidence of measles, mumps, or rubella immunity or laboratory confirmation of disease should consider measles and mumps immunization with 2 doses of MMR vaccine or rubella immunization with 1 dose of MMR vaccine.  Pneumococcal 13-valent conjugate (PCV13) vaccine. When indicated, a person who is uncertain of his immunization history and has no record of immunization should receive the PCV13 vaccine. All adults 65 years of age and older should receive this  vaccine. An adult aged 19 years or older who has certain medical conditions and has not been previously immunized should receive 1 dose of PCV13 vaccine. This PCV13 should be followed with a dose of pneumococcal polysaccharide (PPSV23) vaccine. Adults who are at high risk for pneumococcal disease should obtain the PPSV23 vaccine at least 8 weeks after the dose of PCV13 vaccine. Adults older than 79 years of age who have normal immune system function should obtain the PPSV23 vaccine dose at least 1 year after the dose of PCV13 vaccine.  Pneumococcal polysaccharide (PPSV23) vaccine. When PCV13 is also indicated, PCV13 should be obtained first. All adults aged 65 years and older should be immunized. An adult younger than age 65 years who has certain medical conditions should be immunized. Any person who resides in a nursing home or long-term care facility should be immunized. An adult smoker should be immunized. People with an immunocompromised condition and certain other conditions should receive both PCV13 and PPSV23 vaccines. People with human immunodeficiency virus (HIV) infection should be immunized as soon as possible after diagnosis. Immunization during chemotherapy or radiation therapy should be avoided. Routine use of PPSV23 vaccine is not recommended for American Indians, [place] Natives, or people younger than 65 years unless there are medical conditions that require PPSV23 vaccine. When indicated, people who have unknown immunization and have no record of immunization should receive PPSV23 vaccine. One-time revaccination 5 years after the first dose of PPSV23 is recommended for people aged 19-64 years who have chronic kidney failure, nephrotic syndrome, asplenia, or immunocompromised conditions. People who received 1-2 doses of PPSV23 before age 65 years should receive another dose of PPSV23 vaccine at age 65 years or later if at least 5 years have passed since the previous dose. Doses of PPSV23 are not  needed for people immunized with PPSV23 at or after age 65 years.  Meningococcal vaccine. Adults with asplenia or persistent complement component deficiencies should receive 2 doses of quadrivalent meningococcal conjugate (MenACWY-D) vaccine. The doses should be obtained   at least 2 months apart. Microbiologists working with certain meningococcal bacteria, [place] recruits, people at risk during an outbreak, and people who travel to or live in countries with a high rate of meningitis should be immunized. A first-year college student up through age 34 years who is living in a residence hall should receive a dose if she did not receive a dose on or after her 16th birthday. Adults who have certain high-risk conditions should receive one or more doses of vaccine.  Hepatitis A vaccine. Adults who wish to be protected from this disease, have certain high-risk conditions, work with hepatitis A-infected animals, work in hepatitis A research labs, or travel to or work in countries with a high rate of hepatitis A should be immunized. Adults who were previously unvaccinated and who anticipate close contact with an international adoptee during the first 60 days after arrival in the [place] from a country with a high rate of hepatitis A should be immunized.  Hepatitis B vaccine. Adults who wish to be protected from this disease, have certain high-risk conditions, may be exposed to blood or other infectious body fluids, are household contacts or sex partners of hepatitis B positive people, are clients or workers in certain care facilities, or travel to or work in countries with a high rate of hepatitis B should be immunized.  Haemophilus influenzae type b (Hib) vaccine. A previously unvaccinated person with asplenia or sickle cell disease or having a scheduled splenectomy should receive 1 dose of Hib vaccine. Regardless of previous immunization, a recipient of a hematopoietic stem cell transplant should receive a  3-dose series 6-12 months after her successful transplant. Hib vaccine is not recommended for adults with HIV infection. Preventive Services / Frequency Ages 35 to 4 years  Blood pressure check.** / Every 3-5 years.  Lipid and cholesterol check.** / Every 5 years beginning at age 60.  Clinical breast exam.** / Every 3 years for women in their 71s and 10s.  BRCA-related cancer risk assessment.** / For women who have family members with a BRCA-related cancer (breast, ovarian, tubal, or peritoneal cancers).  Pap test.** / Every 2 years from ages 76 through 26. Every 3 years starting at age 61 through age 76 or 93 with a history of 3 consecutive normal Pap tests.  HPV screening.** / Every 3 years from ages 37 through ages 60 to 51 with a history of 3 consecutive normal Pap tests.  Hepatitis C blood test.** / For any individual with known risks for hepatitis C.  Skin self-exam. / Monthly.  Influenza vaccine. / Every year.  Tetanus, diphtheria, and acellular pertussis (Tdap, Td) vaccine.** / Consult your health care provider. Pregnant women should receive 1 dose of Tdap vaccine during each pregnancy. 1 dose of Td every 10 years.  Varicella vaccine.** / Consult your health care provider. Pregnant females who do not have evidence of immunity should receive the first dose after pregnancy.  HPV vaccine. / 3 doses over 6 months, if 93 and younger. The vaccine is not recommended for use in pregnant females. However, pregnancy testing is not needed before receiving a dose.  Measles, mumps, rubella (MMR) vaccine.** / You need at least 1 dose of MMR if you were born in 1957 or later. You may also need a 2nd dose. For females of childbearing age, rubella immunity should be determined. If there is no evidence of immunity, females who are not pregnant should be vaccinated. If there is no evidence of immunity, females who are  pregnant should delay immunization until after pregnancy.  Pneumococcal  13-valent conjugate (PCV13) vaccine.** / Consult your health care provider.  Pneumococcal polysaccharide (PPSV23) vaccine.** / 1 to 2 doses if you smoke cigarettes or if you have certain conditions.  Meningococcal vaccine.** / 1 dose if you are age 68 to 8 years and a Market researcher living in a residence hall, or have one of several medical conditions, you need to get vaccinated against meningococcal disease. You may also need additional booster doses.  Hepatitis A vaccine.** / Consult your health care provider.  Hepatitis B vaccine.** / Consult your health care provider.  Haemophilus influenzae type b (Hib) vaccine.** / Consult your health care provider. Ages 7 to 53 years  Blood pressure check.** / Every year.  Lipid and cholesterol check.** / Every 5 years beginning at age 25 years.  Lung cancer screening. / Every year if you are aged 11-80 years and have a 30-pack-year history of smoking and currently smoke or have quit within the past 15 years. Yearly screening is stopped once you have quit smoking for at least 15 years or develop a health problem that would prevent you from having lung cancer treatment.  Clinical breast exam.** / Every year after age 48 years.  BRCA-related cancer risk assessment.** / For women who have family members with a BRCA-related cancer (breast, ovarian, tubal, or peritoneal cancers).  Mammogram.** / Every year beginning at age 41 years and continuing for as long as you are in good health. Consult with your health care provider.  Pap test.** / Every 3 years starting at age 65 years through age 37 or 70 years with a history of 3 consecutive normal Pap tests.  HPV screening.** / Every 3 years from ages 72 years through ages 60 to 40 years with a history of 3 consecutive normal Pap tests.  Fecal occult blood test (FOBT) of stool. / Every year beginning at age 21 years and continuing until age 5 years. You may not need to do this test if you get  a colonoscopy every 10 years.  Flexible sigmoidoscopy or colonoscopy.** / Every 5 years for a flexible sigmoidoscopy or every 10 years for a colonoscopy beginning at age 35 years and continuing until age 48 years.  Hepatitis C blood test.** / For all people born from [birth year] through 1965 and any individual with known risks for hepatitis C.  Skin self-exam. / Monthly.  Influenza vaccine. / Every year.  Tetanus, diphtheria, and acellular pertussis (Tdap/Td) vaccine.** / Consult your health care provider. Pregnant women should receive 1 dose of Tdap vaccine during each pregnancy. 1 dose of Td every 10 years.  Varicella vaccine.** / Consult your health care provider. Pregnant females who do not have evidence of immunity should receive the first dose after pregnancy.  Zoster vaccine.** / 1 dose for adults aged 30 years or older.  Measles, mumps, rubella (MMR) vaccine.** / You need at least 1 dose of MMR if you were born in 1957 or later. You may also need a second dose. For females of childbearing age, rubella immunity should be determined. If there is no evidence of immunity, females who are not pregnant should be vaccinated. If there is no evidence of immunity, females who are pregnant should delay immunization until after pregnancy.  Pneumococcal 13-valent conjugate (PCV13) vaccine.** / Consult your health care provider.  Pneumococcal polysaccharide (PPSV23) vaccine.** / 1 to 2 doses if you smoke cigarettes or if you have certain conditions.  Meningococcal vaccine.** /  Consult your health care provider.  Hepatitis A vaccine.** / Consult your health care provider.  Hepatitis B vaccine.** / Consult your health care provider.  Haemophilus influenzae type b (Hib) vaccine.** / Consult your health care provider. Ages 64 years and over  Blood pressure check.** / Every year.  Lipid and cholesterol check.** / Every 5 years beginning at age 23 years.  Lung cancer screening. / Every year if you  are aged 16-80 years and have a 30-pack-year history of smoking and currently smoke or have quit within the past 15 years. Yearly screening is stopped once you have quit smoking for at least 15 years or develop a health problem that would prevent you from having lung cancer treatment.  Clinical breast exam.** / Every year after age 74 years.  BRCA-related cancer risk assessment.** / For women who have family members with a BRCA-related cancer (breast, ovarian, tubal, or peritoneal cancers).  Mammogram.** / Every year beginning at age 44 years and continuing for as long as you are in good health. Consult with your health care provider.  Pap test.** / Every 3 years starting at age 58 years through age 22 or 39 years with 3 consecutive normal Pap tests. Testing can be stopped between 65 and 70 years with 3 consecutive normal Pap tests and no abnormal Pap or HPV tests in the past 10 years.  HPV screening.** / Every 3 years from ages 64 years through ages 70 or 61 years with a history of 3 consecutive normal Pap tests. Testing can be stopped between 65 and 70 years with 3 consecutive normal Pap tests and no abnormal Pap or HPV tests in the past 10 years.  Fecal occult blood test (FOBT) of stool. / Every year beginning at age 40 years and continuing until age 27 years. You may not need to do this test if you get a colonoscopy every 10 years.  Flexible sigmoidoscopy or colonoscopy.** / Every 5 years for a flexible sigmoidoscopy or every 10 years for a colonoscopy beginning at age 7 years and continuing until age 32 years.  Hepatitis C blood test.** / For all people born from [birth year] through 1963 and any individual with known risks for hepatitis C.  Osteoporosis screening.** / A one-time screening for women ages 30 years and over and women at risk for fractures or osteoporosis.  Skin self-exam. / Monthly.  Influenza vaccine. / Every year.  Tetanus, diphtheria, and acellular pertussis (Tdap/Td)  vaccine.** / 1 dose of Td every 10 years.  Varicella vaccine.** / Consult your health care provider.  Zoster vaccine.** / 1 dose for adults aged 35 years or older.  Pneumococcal 13-valent conjugate (PCV13) vaccine.** / Consult your health care provider.  Pneumococcal polysaccharide (PPSV23) vaccine.** / 1 dose for all adults aged 46 years and older.  Meningococcal vaccine.** / Consult your health care provider.  Hepatitis A vaccine.** / Consult your health care provider.  Hepatitis B vaccine.** / Consult your health care provider.  Haemophilus influenzae type b (Hib) vaccine.** / Consult your health care provider. ** Family history and personal history of risk and conditions may change your health care provider's recommendations.   This information is not intended to replace advice given to you by your health care provider. Make sure you discuss any questions you have with your health care provider.   Document Released: 12/04/2001 Document Revised: 10/29/2014 Document Reviewed: 03/05/2011 Elsevier Interactive Patient Education [employer].

## 2015-09-06 NOTE — Assessment & Plan Note (Signed)
Check labs  con't zocor 

## 2015-09-06 NOTE — Assessment & Plan Note (Signed)
Per oncology °

## 2015-09-06 NOTE — Progress Notes (Signed)
Pre visit review using our clinic review tool, if applicable. No additional management support is needed unless otherwise documented below in the visit note. 

## 2015-09-06 NOTE — Progress Notes (Signed)
Subjective:   Lindsey Patel is a 81 y.o. female who presents for Medicare Annual (Subsequent) preventive examination.  Review of Systems:   Review of Systems  Constitutional: Negative for activity change, appetite change and fatigue.  HENT: Negative for hearing loss, congestion, tinnitus and ear discharge.   Eyes: Negative for visual disturbance (see optho q1y -- vision corrected to 20/20 with glasses).  Respiratory: Negative for cough, chest tightness and shortness of breath.   Cardiovascular: Negative for chest pain, palpitations and leg swelling.  Gastrointestinal: Negative for abdominal pain, diarrhea, constipation and abdominal distention.  Genitourinary: Negative for urgency, frequency, decreased urine volume and difficulty urinating.  Musculoskeletal: Negative for back pain, arthralgias and gait problem.  Skin: Negative for color change, pallor and rash.  Neurological: Negative for dizziness, light-headedness, numbness and headaches.  Hematological: Negative for adenopathy. Does not bruise/bleed easily.  Psychiatric/Behavioral: Negative for suicidal ideas, confusion, sleep disturbance, self-injury, dysphoric mood, decreased concentration and agitation.  Pt is able to read and write and can do all ADLs No risk for falling No abuse/ violence in home           Objective:     Vitals: BP 134/82 mmHg  Pulse 77  Temp(Src) 97.7 F (36.5 C) (Oral)  Ht _0  (1.575 m)  Wt 132 lb (59.875 kg)  BMI 24.14 kg/m2  SpO2 96% BP 134/82 mmHg  Pulse 77  Temp(Src) 97.7 F (36.5 C) (Oral)  Ht _1  (1.575 m)  Wt 132 lb (59.875 kg)  BMI 24.14 kg/m2  SpO2 96% General appearance: alert, cooperative, appears stated age and no distress Head: Normocephalic, without obvious abnormality, atraumatic Eyes: conjunctivae/corneas clear. PERRL, EOM's intact. Fundi benign. Ears: normal TM's and external ear canals both ears Nose: Nares normal. Septum midline. Mucosa normal. No drainage or  sinus tenderness. Throat: lips, mucosa, and tongue normal; teeth and gums normal Neck: no adenopathy, no carotid bruit, no JVD, supple, symmetrical, trachea midline and thyroid not enlarged, symmetric, no tenderness/mass/nodules Back: symmetric, no curvature. ROM normal. No CVA tenderness. Lungs: clear to auscultation bilaterally Breasts: normal appearance, no masses or tenderness Heart: S1, S2 normal Abdomen: soft, non-tender; bowel sounds normal; no masses,  no organomegaly Pelvic: deferred Extremities: extremities normal, atraumatic, no cyanosis or edema Pulses: 2+ and symmetric Skin: Skin color, texture, turgor normal. No rashes or lesions Lymph nodes: Cervical, supraclavicular, and axillary nodes normal. Neurologic: Alert and oriented X 3, normal strength and tone. Normal symmetric reflexes. Normal coordination and gait Psych- no depression, no anxiety  Tobacco History  Smoking status  . Former Smoker -- 0.50 packs/day for 4 years  . Types: Cigarettes  . Quit date: 12/13/1963  Smokeless tobacco  . Never Used     Counseling given: Not Answered   Past Medical History  Diagnosis Date  . Hyperlipidemia   . Osteoporosis   . Leukemia ([place])   . Chronic lymphocytic leukemia 09/22/2007    Qualifier: Diagnosis of  By: Jerold Coombe     Past Surgical History  Procedure Laterality Date  . Tubal ligation    . Cataract extraction  6/03    left  . Cataract extraction  4/09    right   Family History  Problem Relation Age of Onset  . Stroke    . Alzheimer's disease    . Dementia    . Parkinsonism    . Stroke Mother   . Cancer Father     stomach  . Parkinsonism Brother   .  Cancer Brother     lung  . Heart disease Brother     MI  . Alzheimer's disease Sister    History  Sexual Activity  . Sexual Activity:  . Partners: Male    Outpatient Encounter Prescriptions as of 09/06/2015  Medication Sig  . aspirin 325 MG tablet Take 325 mg by mouth daily.  . calcium  carbonate (OS-CAL) 600 MG TABS Take 600 mg by mouth 2 (two) times daily with a meal.  . fish oil-omega-3 fatty acids 1000 MG capsule Take 2 g by mouth 2 (two) times daily.   . Flaxseed, Linseed, (FLAXSEED OIL PO) Take 1,400 mg by mouth daily.  . Multiple Vitamin (MULTIVITAMIN) tablet Take 1 tablet by mouth daily.  . simvastatin (ZOCOR) 40 MG tablet Take 1 tablet (40 mg total) by mouth at bedtime. Repeat labs are due now  . [DISCONTINUED] aspirin 81 MG tablet Take 81 mg by mouth daily.   No facility-administered encounter medications on file as of 09/06/2015.    Activities of Daily Living In your present state of health, do you have any difficulty performing the following activities: 03/04/2015  Hearing? Y  Vision? Y  Difficulty concentrating or making decisions? N  Walking or climbing stairs? N  Dressing or bathing? N  Doing errands, shopping? N    Patient Care Team: Rosalita Chessman, DO as PCP - General Roxy Cedar. Dema Severin, MD (Obstetrics and Gynecology) Brunetta Genera, MD as Consulting Physician (Hematology)    Assessment:    cpe Exercise Activities and Dietary recommendations--walking    Goals    None     Fall Risk Fall Risk  03/04/2015 02/26/2014 12/18/2012  Falls in the past year? Yes No No  Number falls in past yr: 1 - -   Depression Screen PHQ 2/9 Scores 03/04/2015 02/26/2014 12/18/2012 12/13/2011  PHQ - 2 Score 1 0 0 0     Cognitive Testing mmse 30/30  Immunization History  Administered Date(s) Administered  . Influenza Split 08/07/2011, 07/21/2012  . Influenza Whole 09/03/2007, 08/04/2008, 08/03/2009, 08/04/2010  . Influenza,inj,Quad PF,36+ Mos 07/24/2013, 07/12/2014, 07/13/2015  . Pneumococcal Polysaccharide-23 10/04/2004, 07/21/2012  . Td 10/03/2005   Screening Tests Health Maintenance  Topic Date Due  . PNA vac Low Risk Adult (2 of 2 - PCV13) 09/05/2020 (Originally 07/21/2013)  . TETANUS/TDAP  10/04/2015  . INFLUENZA VACCINE  05/22/2016  . MAMMOGRAM   06/29/2016  . DEXA SCAN  Completed      Plan:    see AVS During the course of the visit the patient was educated and counseled about the following appropriate screening and preventive services:   Vaccines to include Pneumoccal, Influenza, Hepatitis B, Td, Zostavax, HCV  Electrocardiogram  Cardiovascular Disease  Colorectal cancer screening  Bone density screening  Diabetes screening  Glaucoma screening  Mammography/PAP  Nutrition counseling   Patient Instructions (the written plan) was given to the patient.  1. Medicare annual wellness visit, subsequent   2. Hyperlipidemia LDL goal <100 con't zocor - Comp Met (CMET) - CBC with Differential/Platelet - Lipid panel - POCT urinalysis dipstick  3. Leukocytosis   4. Routine history and physical examination of adult   5. Hyperlipidemia con't zocor Check labs  6. Urine abnormality  - Urine Culture  7. Urine leukocytes  - Urine Culture  Garnet Koyanagi, DO  09/06/2015

## 2015-09-07 LAB — URINE CULTURE
COLONY COUNT: NO GROWTH
Organism ID, Bacteria: NO GROWTH

## 2015-11-17 ENCOUNTER — Other Ambulatory Visit: Payer: Self-pay | Admitting: Family Medicine

## 2015-11-17 NOTE — Telephone Encounter (Signed)
Refilled patients rx request refill #[id number] with 0rf

## 2016-02-16 ENCOUNTER — Other Ambulatory Visit: Payer: Self-pay | Admitting: Family Medicine

## 2016-03-06 ENCOUNTER — Encounter: Payer: Self-pay | Admitting: Family Medicine

## 2016-03-06 ENCOUNTER — Ambulatory Visit (INDEPENDENT_AMBULATORY_CARE_PROVIDER_SITE_OTHER): Payer: Medicare Other | Admitting: Family Medicine

## 2016-03-06 VITALS — BP 118/60 | HR 93 | Temp 98.1°F | Ht 62.0 in | Wt 130.0 lb

## 2016-03-06 DIAGNOSIS — E785 Hyperlipidemia, unspecified: Secondary | ICD-10-CM

## 2016-03-06 LAB — LIPID PANEL
CHOLESTEROL: 140 mg/dL (ref 0–200)
HDL: 51.3 mg/dL (ref >39.00)
LDL Cholesterol: 70 mg/dL (ref 0–99)
NonHDL: 88.29
TRIGLYCERIDES: 89 mg/dL (ref 0.0–149.0)
Total CHOL/HDL Ratio: 3
VLDL: 17.8 mg/dL (ref 0.0–40.0)

## 2016-03-06 LAB — COMPREHENSIVE METABOLIC PANEL
ALBUMIN: 4.1 g/dL (ref 3.5–5.2)
ALT: 13 U/L (ref 0–35)
AST: 21 U/L (ref 0–37)
Alkaline Phosphatase: 63 U/L (ref 39–117)
BILIRUBIN TOTAL: 0.4 mg/dL (ref 0.2–1.2)
BUN: 9 mg/dL (ref 6–23)
CALCIUM: 9.4 mg/dL (ref 8.4–10.5)
CHLORIDE: 104 meq/L (ref 96–112)
CO2: 28 mEq/L (ref 19–32)
CREATININE: 0.52 mg/dL (ref 0.40–1.20)
GFR: 118.62 mL/min (ref >60.00)
Glucose, Bld: 87 mg/dL (ref 70–99)
Potassium: 4.3 mEq/L (ref 3.5–5.1)
SODIUM: 138 meq/L (ref 135–145)
TOTAL PROTEIN: 6.2 g/dL (ref 6.0–8.3)

## 2016-03-06 MED ORDER — SIMVASTATIN 40 MG PO TABS: 40.0000 mg | ORAL_TABLET | Freq: Every day | ORAL | Status: DC

## 2016-03-06 NOTE — Progress Notes (Signed)
Patient ID: Lindsey Patel, female    DOB: September 30, 1927  Age: 82 y.o. MRN: [id number]    Subjective:  Subjective HPI Lindsey Patel presents for f/u cholesterol.  No complaints.   Review of Systems  Constitutional: Negative for diaphoresis, appetite change, fatigue and unexpected weight change.  Eyes: Negative for pain, redness and visual disturbance.  Respiratory: Negative for cough, chest tightness, shortness of breath and wheezing.   Cardiovascular: Negative for chest pain, palpitations and leg swelling.  Endocrine: Negative for cold intolerance, heat intolerance, polydipsia, polyphagia and polyuria.  Genitourinary: Negative for dysuria, frequency and difficulty urinating.  Neurological: Negative for dizziness, light-headedness, numbness and headaches.    History Past Medical History  Diagnosis Date  . Hyperlipidemia   . Osteoporosis   . Leukemia ([place])   . Chronic lymphocytic leukemia 09/22/2007    Qualifier: Diagnosis of  By: Jerold Coombe      She has past surgical history that includes Tubal ligation; Cataract extraction (6/03); and Cataract extraction (4/09).   Her family history includes Alzheimer's disease in her sister; Cancer in her brother and father; Heart disease in her brother; Parkinsonism in her brother; Stroke in her mother.She reports that she quit smoking about 52 years ago. Her smoking use included Cigarettes. She has a 2 pack-year smoking history. She has never used smokeless tobacco. She reports that she does not drink alcohol or use illicit drugs.  Current Outpatient Prescriptions on File Prior to Visit  Medication Sig Dispense Refill  . aspirin 325 MG tablet Take 325 mg by mouth daily.    . calcium carbonate (OS-CAL) 600 MG TABS Take 600 mg by mouth 2 (two) times daily with a meal.    . fish oil-omega-3 fatty acids 1000 MG capsule Take 2 g by mouth 2 (two) times daily.     . Flaxseed, Linseed, (FLAXSEED OIL PO) Take 1,400 mg by mouth daily.    . Multiple  Vitamin (MULTIVITAMIN) tablet Take 1 tablet by mouth daily.     No current facility-administered medications on file prior to visit.     Objective:  Objective Physical Exam  Constitutional: She is oriented to person, place, and time. She appears well-developed and well-nourished.  HENT:  Head: Normocephalic and atraumatic.  Eyes: Conjunctivae and EOM are normal.  Neck: Normal range of motion. Neck supple. No JVD present. Carotid bruit is not present. No thyromegaly present.  Cardiovascular: Normal rate, regular rhythm and normal heart sounds.   No murmur heard. Pulmonary/Chest: Effort normal and breath sounds normal. No respiratory distress. She has no wheezes. She has no rales. She exhibits no tenderness.  Musculoskeletal: She exhibits no edema.  Neurological: She is alert and oriented to person, place, and time.  Psychiatric: She has a normal mood and affect.  Nursing note and vitals reviewed.  BP 118/60 mmHg  Pulse 93  Temp(Src) 98.1 F (36.7 C) (Oral)  Ht 5\' 2"  (1.575 m)  Wt 130 lb (58.968 kg)  BMI 23.77 kg/m2  SpO2 97% Wt Readings from Last 3 Encounters:  03/06/16 130 lb (58.968 kg)  09/06/15 132 lb (59.875 kg)  07/13/15 132 lb 4.8 oz (60.011 kg)     Lab Results  Component Value Date   WBC 15.9* 09/06/2015   HGB 13.5 09/06/2015   HCT 41.7 09/06/2015   PLT 212.0 09/06/2015   GLUCOSE 97 09/06/2015   CHOL 137 09/06/2015   TRIG 95.0 09/06/2015   HDL 55.60 09/06/2015   LDLDIRECT 93.9 12/08/2008   LDLCALC 62  09/06/2015   ALT 15 09/06/2015   AST 23 09/06/2015   NA 142 09/06/2015   K 4.2 09/06/2015   CL 106 09/06/2015   CREATININE 0.55 09/06/2015   BUN 13 09/06/2015   CO2 28 09/06/2015   TSH 1.50 11/07/2010    No results found.   Assessment & Plan:  Plan I am having Ms. Quade maintain her calcium carbonate, multivitamin, fish oil-omega-3 fatty acids, (Flaxseed, Linseed, (FLAXSEED OIL PO)), aspirin, OVER THE COUNTER MEDICATION, and simvastatin.  Meds  ordered this encounter  Medications  . OVER THE COUNTER MEDICATION    Sig: Nasal spray-Apply 1 spray to each nostril daily.  . simvastatin (ZOCOR) 40 MG tablet    Sig: Take 1 tablet (40 mg total) by mouth daily at 6 PM.    Dispense:  90 tablet    Refill:  1    Problem List Items Addressed This Visit    None    Visit Diagnoses    Hyperlipidemia    -  Primary    Relevant Medications    simvastatin (ZOCOR) 40 MG tablet    Other Relevant Orders    Lipid panel    Comprehensive metabolic panel       Follow-up: Return in about 6 months (around 09/06/2016), or if symptoms worsen or fail to improve, for annual exam, fasting.  Ann Held, DO

## 2016-03-06 NOTE — Progress Notes (Signed)
Pre visit review using our clinic review tool, if applicable. No additional management support is needed unless otherwise documented below in the visit note. 

## 2016-03-06 NOTE — Patient Instructions (Signed)

## 2016-07-12 ENCOUNTER — Other Ambulatory Visit: Payer: Self-pay | Admitting: *Deleted

## 2016-07-12 ENCOUNTER — Telehealth: Payer: Self-pay | Admitting: Hematology

## 2016-07-12 ENCOUNTER — Ambulatory Visit (HOSPITAL_BASED_OUTPATIENT_CLINIC_OR_DEPARTMENT_OTHER): Payer: Medicare Other

## 2016-07-12 ENCOUNTER — Encounter: Payer: Self-pay | Admitting: Hematology

## 2016-07-12 ENCOUNTER — Ambulatory Visit (HOSPITAL_BASED_OUTPATIENT_CLINIC_OR_DEPARTMENT_OTHER): Payer: Medicare Other | Admitting: Hematology

## 2016-07-12 VITALS — BP 181/79 | HR 79 | Temp 98.1°F | Resp 18 | Ht 62.0 in | Wt 132.1 lb

## 2016-07-12 DIAGNOSIS — C9110 Chronic lymphocytic leukemia of B-cell type not having achieved remission: Secondary | ICD-10-CM | POA: Diagnosis not present

## 2016-07-12 DIAGNOSIS — Z23 Encounter for immunization: Secondary | ICD-10-CM | POA: Diagnosis not present

## 2016-07-12 LAB — CBC & DIFF AND RETIC
BASO%: 0.2 % (ref 0.0–2.0)
Basophils Absolute: 0 10*3/uL (ref 0.0–0.1)
EOS ABS: 0.2 10*3/uL (ref 0.0–0.5)
EOS%: 1.6 % (ref 0.0–7.0)
HCT: 40.3 % (ref 34.8–46.6)
HEMOGLOBIN: 13.7 g/dL (ref 11.6–15.9)
IMMATURE RETIC FRACT: 2.3 % (ref 1.60–10.00)
LYMPH#: 10.7 10*3/uL — AB (ref 0.9–3.3)
LYMPH%: 72 % — ABNORMAL HIGH (ref 14.0–49.7)
MCH: 31.1 pg (ref 25.1–34.0)
MCHC: 34 g/dL (ref 31.5–36.0)
MCV: 91.6 fL (ref 79.5–101.0)
MONO#: 0.5 10*3/uL (ref 0.1–0.9)
MONO%: 3.4 % (ref 0.0–14.0)
NEUT%: 22.8 % — ABNORMAL LOW (ref 38.4–76.8)
NEUTROS ABS: 3.4 10*3/uL (ref 1.5–6.5)
Platelets: 212 10*3/uL (ref 145–400)
RBC: 4.4 10*6/uL (ref 3.70–5.45)
RDW: 13.5 % (ref 11.2–14.5)
RETIC CT ABS: 64.68 10*3/uL (ref 33.70–90.70)
Retic %: 1.47 % (ref 0.70–2.10)
WBC: 14.8 10*3/uL — AB (ref 3.9–10.3)

## 2016-07-12 LAB — COMPREHENSIVE METABOLIC PANEL
ALT: 18 U/L (ref 0–55)
AST: 23 U/L (ref 5–34)
Albumin: 3.6 g/dL (ref 3.5–5.0)
Alkaline Phosphatase: 79 U/L (ref 40–150)
Anion Gap: 10 mEq/L (ref 3–11)
BILIRUBIN TOTAL: 0.4 mg/dL (ref 0.20–1.20)
BUN: 11 mg/dL (ref 7.0–26.0)
CO2: 26 meq/L (ref 22–29)
CREATININE: 0.6 mg/dL (ref 0.6–1.1)
Calcium: 9.4 mg/dL (ref 8.4–10.4)
Chloride: 106 mEq/L (ref 98–109)
EGFR: 80 mL/min/{1.73_m2} — ABNORMAL LOW (ref >90)
GLUCOSE: 92 mg/dL (ref 70–140)
Potassium: 4.1 mEq/L (ref 3.5–5.1)
SODIUM: 142 meq/L (ref 136–145)
TOTAL PROTEIN: 6.6 g/dL (ref 6.4–8.3)

## 2016-07-12 LAB — LACTATE DEHYDROGENASE: LDH: 172 U/L (ref 125–245)

## 2016-07-12 MED ORDER — INFLUENZA VAC SPLIT QUAD 0.5 ML IM SUSY
0.5000 mL | PREFILLED_SYRINGE | Freq: Once | INTRAMUSCULAR | Status: AC
  Administered 2016-07-12: 0.5 mL via INTRAMUSCULAR
  Filled 2016-07-12: qty 0.5

## 2016-07-12 NOTE — Progress Notes (Signed)
[facility]  HEMATOLOGY ONCOLOGY PROGRESS NOTE  Date of service: 07/13/2015  Patient Care Team: Ann Held, DO as PCP - General Roxy Cedar. Dema Severin, MD (Obstetrics and Gynecology) Brunetta Genera, MD as Consulting Physician (Hematology) Crista Luria, MD as Consulting Physician (Dermatology)  CC: Follow-up for CLL  Diagnosis: Rai Stage 0 CLL  Current Treatment: Monitoring/Observation.  INTERVAL HISTORY:  [name] is here for her scheduled follow-up of CLL after her last clinic visit with me about a year ago. She notes that she has been doing well and appears to be feeling quite well.  Is here accompanied by her son. Notes no acute new symptoms. No issues with increasing fatigue/night sweats/fevers/chills/frequent infections .no issues with excessive bruising or bleeding. No abdominal pain no chest pain or shortness of breath .  REVIEW OF SYSTEMS:    10 Point review of systems of done and is negative except as noted above.  . Past Medical History:  Diagnosis Date  . Chronic lymphocytic leukemia 09/22/2007   Qualifier: Diagnosis of  By: Jerold Coombe    . Hyperlipidemia   . Leukemia ([place])   . Osteoporosis     . Past Surgical History:  Procedure Laterality Date  . CATARACT EXTRACTION  6/03   left  . CATARACT EXTRACTION  4/09   right  . TUBAL LIGATION      . Social History  Substance Use Topics  . Smoking status: Former Smoker    Packs/day: 0.50    Years: 4.00    Types: Cigarettes    Quit date: 12/13/1963  . Smokeless tobacco: Never Used  . Alcohol use No    ALLERGIES:  is allergic to pneumococcal vaccines.  MEDICATIONS:  Current Outpatient Prescriptions  Medication Sig Dispense Refill  . aspirin 325 MG tablet Take 325 mg by mouth daily.    . calcium carbonate (OS-CAL) 600 MG TABS Take 600 mg by mouth 2 (two) times daily with a meal.    . fish oil-omega-3 fatty acids 1000 MG capsule Take 2 g by mouth 2 (two) times daily.     . Flaxseed, Linseed,  (FLAXSEED OIL PO) Take 1,400 mg by mouth daily.    . Multiple Vitamin (MULTIVITAMIN) tablet Take 1 tablet by mouth daily.    [facility] OVER THE COUNTER MEDICATION Nasal spray-Apply 1 spray to each nostril daily.    . simvastatin (ZOCOR) 40 MG tablet Take 1 tablet (40 mg total) by mouth daily at 6 PM. 90 tablet 1   No current facility-administered medications for this visit.     PHYSICAL EXAMINATION: ECOG PERFORMANCE STATUS: 2 - Symptomatic, <50% confined to bed  Vitals:   07/12/16 1035  BP: (!) 181/79  Pulse: 79  Resp: 18  Temp: 98.1 F (36.7 C)   Filed Weights   07/12/16 1035  Weight: 132 lb 1.6 oz (59.9 kg)   .Body mass index is 24.16 kg/m.  GENERAL:alert, in no acute distress and comfortable SKIN: skin color, texture, turgor are normal, no rashes or significant lesions EYES: normal, conjunctiva are pink and non-injected, sclera clear OROPHARYNX:no exudate, no erythema and lips, buccal mucosa, and tongue normal  NECK: supple, no JVD, thyroid normal size, non-tender, without nodularity LYMPH:  no palpable lymphadenopathy in the cervical, axillary or inguinal LUNGS: clear to auscultation with normal respiratory effort HEART: regular rate & rhythm,  no murmurs and no lower extremity edema ABDOMEN: abdomen soft, non-tender, normoactive bowel sounds . No hepatosplenomegaly.  Musculoskeletal: no cyanosis of digits and no  clubbing  PSYCH: alert & oriented x 3 with fluent speech NEURO: no focal motor/sensory deficits  LABORATORY DATA:   I have reviewed the data as listed  . CBC Latest Ref Rng & Units 07/12/2016 09/06/2015 07/13/2015  WBC 3.9 - 10.3 10e3/uL 14.8(H) 15.9(H) 17.8(H)  Hemoglobin 11.6 - 15.9 g/dL 13.7 13.5 13.9  Hematocrit 34.8 - 46.6 % 40.3 41.7 41.5  Platelets 145 - 400 10e3/uL 212 212.0 193   . CBC    Component Value Date/Time   WBC 14.8 (H) 07/12/2016 1233   WBC 15.9 (H) 09/06/2015 1059   RBC 4.40 07/12/2016 1233   RBC 4.45 09/06/2015 1059   HGB 13.7  07/12/2016 1233   HCT 40.3 07/12/2016 1233   PLT 212 07/12/2016 1233   MCV 91.6 07/12/2016 1233   MCH 31.1 07/12/2016 1233   MCHC 34.0 07/12/2016 1233   MCHC 32.4 09/06/2015 1059   RDW 13.5 07/12/2016 1233   LYMPHSABS 10.7 (H) 07/12/2016 1233   MONOABS 0.5 07/12/2016 1233   EOSABS 0.2 07/12/2016 1233   BASOSABS 0.0 07/12/2016 1233    . CMP Latest Ref Rng & Units 07/12/2016 03/06/2016 09/06/2015  Glucose 70 - 140 mg/dl 92 87 97  BUN 7.0 - 26.0 mg/dL 11.0 9 13  Creatinine 0.6 - 1.1 mg/dL 0.6 0.52 0.55  Sodium 136 - 145 mEq/L 142 138 142  Potassium 3.5 - 5.1 mEq/L 4.1 4.3 4.2  Chloride 96 - 112 mEq/L - 104 106  CO2 22 - 29 mEq/L 26 28 28   Calcium 8.4 - 10.4 mg/dL 9.4 9.4 9.9  Total Protein 6.4 - 8.3 g/dL 6.6 6.2 6.3  Total Bilirubin 0.20 - 1.20 mg/dL 0.40 0.4 0.5  Alkaline Phos 40 - 150 U/L 79 63 75  AST 5 - 34 U/L 23 21 23   ALT 0 - 55 U/L 18 13 15    . Lab Results  Component Value Date   LDH 172 07/12/2016     flow cytometry 07/13/2015 :       RADIOGRAPHIC STUDIES: I have personally reviewed the radiological images as listed and agreed with the findings in the report. No results found.  ASSESSMENT & PLAN:    82 year old Caucasian female with  #1 Rai stage 0 CLL with stable lymphocytosis. No lymphadenopathy. No hepatosplenomegaly. No cytopenias. No constitutional symptoms. Patient's CBC today shows fairly stable lymphocyte count with no significant cytopenias. LDH is within normal limits. Plan -Patient has no indications to warrant treatment of her CLL at this time. -No issues with infections to suggest the need for IVIG -Continue follow-up with primary care physician for other cares.  -She received a flu shot in our clinic today as per her request. -CLL prognostic fish panel sent out. 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  Return to clinic in 1 year with Dr. Irene Limbo with CBC with differential and CMP. Earlier if any change in symptoms Lymphadenopathy or hepatosplenomegaly or change in her CBC that is significant.  Recheck CBC with differential with primary care physician in 6 months  I spent 20 minutes counseling the patient face to face. The total time spent in the appointment was 20 minutes and more than 50% was on counseling and direct patient cares.    Sullivan Lone MD MS AAHIVMS [facility] Hematology/Oncology Physician [facility]  (  Office):       [phone] (Work cell):  [phone] (Fax):           [phone]

## 2016-07-12 NOTE — Telephone Encounter (Signed)
Patient sent back to lab and given avs report and appointments for September 2018

## 2016-07-19 LAB — HM MAMMOGRAPHY

## 2016-07-25 LAB — FISH,CLL PROGNOSTIC PANEL

## 2016-08-07 ENCOUNTER — Telehealth: Payer: Self-pay | Admitting: *Deleted

## 2016-08-07 NOTE — Telephone Encounter (Signed)
"  Lindsey Patel with [facility] calling for code for 07-12-2016 visit with this patient."  ICD 10  For Chronic Lymphocytic Leukemia is C91.10.  No further questions.

## 2016-08-09 ENCOUNTER — Other Ambulatory Visit: Payer: Self-pay | Admitting: Family Medicine

## 2016-09-06 NOTE — Progress Notes (Deleted)
Pre visit review using our clinic review tool, if applicable. No additional management support is needed unless otherwise documented below in the visit note. 

## 2016-09-06 NOTE — Progress Notes (Deleted)
Subjective:   Lindsey Patel is a 82 y.o. female who presents for Medicare Annual (Subsequent) preventive examination.  Review of Systems:  No ROS.  Medicare Wellness Visit.    Sleep patterns:    Home Safety/Smoke Alarms:   Living environment; residence and Firearm Safety:  Seat Belt Safety/Bike Helmet:    Counseling:   Eye Exam-  Dental-  Female:   Pap-  N/a due to age     29- Last on 07/19/16. BI-RADS Category 3: Probably benign. Advised short term f/u with left breast diagnostic mammography and ultrasound in 6 months.      Dexa scan- Last on [facility]-        Objective:     Vitals: There were no vitals taken for this visit.  There is no height or weight on file to calculate BMI.   Tobacco History  Smoking Status  . Former Smoker  . Packs/day: 0.50  . Years: 4.00  . Types: Cigarettes  . Quit date: 12/13/1963  Smokeless Tobacco  . Never Used     Counseling given: Not Answered   Past Medical History:  Diagnosis Date  . Chronic lymphocytic leukemia 09/22/2007   Qualifier: Diagnosis of  By: Jerold Coombe    . Hyperlipidemia   . Leukemia ([place])   . Osteoporosis    Past Surgical History:  Procedure Laterality Date  . CATARACT EXTRACTION  6/03   left  . CATARACT EXTRACTION  4/09   right  . TUBAL LIGATION     Family History  Problem Relation Age of Onset  . Stroke Mother   . Cancer Father     stomach  . Parkinsonism Brother   . Cancer Brother     lung  . Heart disease Brother     MI  . Alzheimer's disease Sister   . Stroke    . Alzheimer's disease    . Dementia    . Parkinsonism     History  Sexual Activity  . Sexual activity: Not Currently  . Partners: Male    Outpatient Encounter Prescriptions as of 09/07/2016  Medication Sig  . aspirin 325 MG tablet Take 325 mg by mouth daily.  . calcium carbonate (OS-CAL) 600 MG TABS Take 600 mg by mouth 2 (two) times daily with a meal.  . fish oil-omega-3 fatty acids 1000 MG capsule Take 2 g by  mouth 2 (two) times daily.   . Flaxseed, Linseed, (FLAXSEED OIL PO) Take 1,400 mg by mouth daily.  . Multiple Vitamin (MULTIVITAMIN) tablet Take 1 tablet by mouth daily.  Marland Kitchen OVER THE COUNTER MEDICATION Nasal spray-Apply 1 spray to each nostril daily.  . simvastatin (ZOCOR) 40 MG tablet Take 1 tablet (40 mg total) by mouth daily at 6 PM.  . simvastatin (ZOCOR) 40 MG tablet TAKE 1 TABLET(40 MG) BY MOUTH DAILY AT 6 PM   No facility-administered encounter medications on file as of 09/07/2016.     Activities of Daily Living No flowsheet data found.  Patient Care Team: Ann Held, DO as PCP - General Roxy Cedar. Dema Severin, MD (Obstetrics and Gynecology) Brunetta Genera, MD as Consulting Physician (Hematology) Crista Luria, MD as Consulting Physician (Dermatology)    Assessment:    Physical assessment deferred to PCP.  Exercise Activities and Dietary recommendations   Diet (meal preparation, eat out, water intake, caffeinated beverages, dairy products, fruits and vegetables): {Desc; diets:16563} Breakfast: Lunch:  Dinner:      Goals    None  Fall Risk Fall Risk  09/06/2015 03/04/2015 02/26/2014 12/18/2012  Falls in the past year? Yes Yes No No  Number falls in past yr: 1 1 - -  Injury with Fall? Yes - - -  Follow up Falls evaluation completed - - -   Depression Screen PHQ 2/9 Scores 09/06/2015 03/04/2015 02/26/2014 12/18/2012  PHQ - 2 Score 0 1 0 0     Cognitive Function        Immunization History  Administered Date(s) Administered  . Influenza Split 08/07/2011, 07/21/2012  . Influenza Whole 09/03/2007, 08/04/2008, 08/03/2009, 08/04/2010  . Influenza,inj,Quad PF,36+ Mos 07/24/2013, 07/12/2014, 07/13/2015, 07/12/2016  . Pneumococcal Polysaccharide-23 10/04/2004, 07/21/2012  . Td 10/03/2005   Screening Tests Health Maintenance  Topic Date Due  . TETANUS/TDAP  10/04/2015  . MAMMOGRAM  06/29/2016  . PNA vac Low Risk Adult (2 of 2 - PCV13) 09/05/2020 (Originally  07/21/2013)  . INFLUENZA VACCINE  Completed  . DEXA SCAN  Completed      Plan:   *** During the course of the visit the patient was educated and counseled about the following appropriate screening and preventive services:   Vaccines to include Pneumoccal, Influenza, Hepatitis B, Td, Zostavax, HCV  Cardiovascular Disease  Colorectal cancer screening  Bone density screening  Diabetes screening  Glaucoma screening  Mammography/PAP  Nutrition counseling   Patient Instructions (the written plan) was given to the patient.   Shela Nevin, [place]  09/06/2016

## 2016-09-07 ENCOUNTER — Ambulatory Visit (INDEPENDENT_AMBULATORY_CARE_PROVIDER_SITE_OTHER): Payer: Medicare Other | Admitting: Family Medicine

## 2016-09-07 ENCOUNTER — Ambulatory Visit: Payer: Medicare Other | Admitting: *Deleted

## 2016-09-07 ENCOUNTER — Encounter: Payer: Self-pay | Admitting: Family Medicine

## 2016-09-07 VITALS — BP 140/76 | HR 81 | Resp 16 | Ht 62.0 in | Wt 132.4 lb

## 2016-09-07 DIAGNOSIS — E785 Hyperlipidemia, unspecified: Secondary | ICD-10-CM

## 2016-09-07 DIAGNOSIS — Z0000 Encounter for general adult medical examination without abnormal findings: Secondary | ICD-10-CM | POA: Diagnosis not present

## 2016-09-07 LAB — COMPREHENSIVE METABOLIC PANEL
ALK PHOS: 57 U/L (ref 39–117)
ALT: 18 U/L (ref 0–35)
AST: 23 U/L (ref 0–37)
Albumin: 4.3 g/dL (ref 3.5–5.2)
BUN: 12 mg/dL (ref 6–23)
CALCIUM: 9.7 mg/dL (ref 8.4–10.5)
CO2: 28 meq/L (ref 19–32)
Chloride: 105 mEq/L (ref 96–112)
Creatinine, Ser: 0.54 mg/dL (ref 0.40–1.20)
GFR: 113.43 mL/min (ref >60.00)
GLUCOSE: 88 mg/dL (ref 70–99)
POTASSIUM: 4.1 meq/L (ref 3.5–5.1)
Sodium: 141 mEq/L (ref 135–145)
TOTAL PROTEIN: 6.4 g/dL (ref 6.0–8.3)
Total Bilirubin: 0.5 mg/dL (ref 0.2–1.2)

## 2016-09-07 LAB — CBC WITH DIFFERENTIAL/PLATELET
Basophils Absolute: 0 10*3/uL (ref 0.0–0.1)
Basophils Relative: 0.3 % (ref 0.0–3.0)
EOS PCT: 1.7 % (ref 0.0–5.0)
Eosinophils Absolute: 0.3 10*3/uL (ref 0.0–0.7)
HCT: 42.1 % (ref 36.0–46.0)
Hemoglobin: 14.1 g/dL (ref 12.0–15.0)
LYMPHS ABS: 11.6 10*3/uL — AB (ref 0.7–4.0)
Lymphocytes Relative: 69.6 % — ABNORMAL HIGH (ref 12.0–46.0)
MCHC: 33.5 g/dL (ref 30.0–36.0)
MCV: 92.8 fl (ref 78.0–100.0)
MONOS PCT: 3.5 % (ref 3.0–12.0)
Monocytes Absolute: 0.6 10*3/uL (ref 0.1–1.0)
NEUTROS ABS: 4.1 10*3/uL (ref 1.4–7.7)
PLATELETS: 222 10*3/uL (ref 150.0–400.0)
RBC: 4.54 Mil/uL (ref 3.87–5.11)
RDW: 13.9 % (ref 11.5–15.5)
WBC: 16.6 10*3/uL — ABNORMAL HIGH (ref 4.0–10.5)

## 2016-09-07 LAB — LIPID PANEL
CHOLESTEROL: 145 mg/dL (ref 0–200)
HDL: 65.7 mg/dL (ref >39.00)
LDL Cholesterol: 61 mg/dL (ref 0–99)
NonHDL: 79.2
TRIGLYCERIDES: 90 mg/dL (ref 0.0–149.0)
Total CHOL/HDL Ratio: 2
VLDL: 18 mg/dL (ref 0.0–40.0)

## 2016-09-07 NOTE — Progress Notes (Signed)
Subjective:     Lindsey Patel is a 82 y.o. female and is here for a comprehensive physical exam. The patient reports no problems.  Social History   Social History  . Marital status: Married    Spouse name: N/A  . Number of children: N/A  . Years of education: N/A   Occupational History  . retired Proofreader Retired   Social History Main Topics  . Smoking status: Former Smoker    Packs/day: 0.50    Years: 4.00    Types: Cigarettes    Quit date: 12/13/1963  . Smokeless tobacco: Never Used  . Alcohol use No  . Drug use: No  . Sexual activity: Not Currently    Partners: Male   Other Topics Concern  . Not on file   Social History Narrative   Walk when weather is ok   Health Maintenance  Topic Date Due  . TETANUS/TDAP  09/07/2016 (Originally 10/04/2015)  . PNA vac Low Risk Adult (2 of 2 - PCV13) 09/05/2020 (Originally 07/21/2013)  . MAMMOGRAM  07/19/2017  . INFLUENZA VACCINE  Completed  . DEXA SCAN  Completed    The following portions of the patient's history were reviewed and updated as appropriate:  She  has a past medical history of Chronic lymphocytic leukemia (09/22/2007); Hyperlipidemia; Leukemia ([place]); and Osteoporosis. She  does not have any pertinent problems on file. She  has a past surgical history that includes Tubal ligation; Cataract extraction (6/03); and Cataract extraction (4/09). Her family history includes Alzheimer's disease in her sister; Cancer in her brother and father; Heart disease in her brother; Parkinsonism in her brother; Stroke in her mother. She  reports that she quit smoking about 52 years ago. Her smoking use included Cigarettes. She has a 2.00 pack-year smoking history. She has never used smokeless tobacco. She reports that she does not drink alcohol or use drugs. She has a current medication list which includes the following prescription(s): aspirin, calcium carbonate, fish oil-omega-3 fatty acids, flaxseed (linseed), multivitamin, OVER  THE COUNTER MEDICATION, and simvastatin. Current Outpatient Prescriptions on File Prior to Visit  Medication Sig Dispense Refill  . aspirin 325 MG tablet Take 325 mg by mouth daily.    . calcium carbonate (OS-CAL) 600 MG TABS Take 600 mg by mouth 2 (two) times daily with a meal.    . fish oil-omega-3 fatty acids 1000 MG capsule Take 2 g by mouth 2 (two) times daily.     . Flaxseed, Linseed, (FLAXSEED OIL PO) Take 1,400 mg by mouth daily.    . Multiple Vitamin (MULTIVITAMIN) tablet Take 1 tablet by mouth daily.    Marland Kitchen OVER THE COUNTER MEDICATION Nasal spray-Apply 1 spray to each nostril daily.    . simvastatin (ZOCOR) 40 MG tablet TAKE 1 TABLET(40 MG) BY MOUTH DAILY AT 6 PM 90 tablet 0   No current facility-administered medications on file prior to visit.    She is allergic to pneumococcal vaccines..  Review of Systems  Review of Systems  Constitutional: Negative for activity change, appetite change and fatigue.  HENT: Negative for hearing loss, congestion, tinnitus and ear discharge.   Eyes: Negative for visual disturbance (see optho q1y -- vision corrected to 20/20 with glasses).  Respiratory: Negative for cough, chest tightness and shortness of breath.   Cardiovascular: Negative for chest pain, palpitations and leg swelling.  Gastrointestinal: Negative for abdominal pain, diarrhea, constipation and abdominal distention.  Genitourinary: Negative for urgency, frequency, decreased urine volume and difficulty urinating.  Musculoskeletal: Negative for back pain, arthralgias and gait problem.  Skin: Negative for color change, pallor and rash.  Neurological: Negative for dizziness, light-headedness, numbness and headaches.  Hematological: Negative for adenopathy. Does not bruise/bleed easily.  Psychiatric/Behavioral: Negative for suicidal ideas, confusion, sleep disturbance, self-injury, dysphoric mood, decreased concentration and agitation.  Pt is able to read and write and can do all  ADLs No risk for falling No abuse/ violence in home    Objective:    BP 140/76 (BP Location: Right Arm, Patient Position: Sitting, Cuff Size: Normal)   Pulse 81   Resp 16   Ht 5\' 2"  (1.575 m)   Wt 132 lb 6.4 oz (60.1 kg)   SpO2 96%   BMI 24.22 kg/m  General appearance: alert, cooperative, appears stated age and no distress Head: Normocephalic, without obvious abnormality, atraumatic Eyes: conjunctivae/corneas clear. PERRL, EOM's intact. Fundi benign. Ears: normal TM's and external ear canals both ears Nose: Nares normal. Septum midline. Mucosa normal. No drainage or sinus tenderness. Throat: lips, mucosa, and tongue normal; teeth and gums normal Neck: no adenopathy, no carotid bruit, no JVD, supple, symmetrical, trachea midline and thyroid not enlarged, symmetric, no tenderness/mass/nodules Back: symmetric, no curvature. ROM normal. No CVA tenderness. Lungs: clear to auscultation bilaterally Breasts: gyn Heart: S1, S2 normal Abdomen: soft, non-tender; bowel sounds normal; no masses,  no organomegaly Pelvic: deferred--gyn Extremities: extremities normal, atraumatic, no cyanosis or edema Pulses: 2+ and symmetric Skin: Skin color, texture, turgor normal. No rashes or lesions Lymph nodes: Cervical, supraclavicular, and axillary nodes normal. Neurologic: Alert and oriented X 3, normal strength and tone. Normal symmetric reflexes. Normal coordination and gait    Assessment:    Healthy female exam.      Plan:    ghm utd Check labs See After Visit Summary for Counseling Recommendations   1. Encounter for Medicare annual wellness exam   - Lipid panel - CBC with Differential/Platelet - POCT urinalysis dipstick - Comprehensive metabolic panel  2. Hyperlipidemia, unspecified hyperlipidemia type   - Lipid panel - CBC with Differential/Platelet - POCT urinalysis dipstick - Comprehensive metabolic panel

## 2016-09-07 NOTE — Progress Notes (Signed)
Pre visit review using our clinic review tool, if applicable. No additional management support is needed unless otherwise documented below in the visit note. 

## 2016-09-07 NOTE — Progress Notes (Signed)
Subjective:   Lindsey Patel is a 82 y.o. female who presents for Medicare Annual (Subsequent) preventive examination.  Review of Systems:  No ROS.  Medicare Wellness Visit.  Cardiac Risk Factors include: advanced age (>22men, >64 women);dyslipidemia Sleep patterns: Sleeps well per pt.  Home Safety/Smoke Alarms:  Lives alone. Smoke detectors in place. Feels safe.  Living environment; residence and Firearm Safety: Has 2 firearms stored safely.  Seat Belt Safety/Bike Helmet: Wears seatbelt.    Counseling:   Eye Exam- Wears glasses. Follows with Dr.Fossey regularly per pt.  Dental- Pt cannot recall dentist's name. States she goes every 6 months.   Female:   Pap-  N/a due to age     2- Last on 07/19/16. BI-RADS Category 3: Probably benign. Report advised short term f/u with left breast diagnostic mammography and ultrasound in 6 months.      Dexa scan- Last on 01/09/10. No report on file. CCS-n/a due to age      Objective:     Vitals: BP 140/76 (BP Location: Right Arm, Patient Position: Sitting, Cuff Size: Normal)   Pulse 81   Resp 16   Ht 5\' 2"  (1.575 m)   Wt 132 lb 6.4 oz (60.1 kg)   SpO2 96%   BMI 24.22 kg/m   Body mass index is 24.22 kg/m.   Tobacco History  Smoking Status  . Former Smoker  . Packs/day: 0.50  . Years: 4.00  . Types: Cigarettes  . Quit date: 12/13/1963  Smokeless Tobacco  . Never Used     Counseling given: Not Answered   Past Medical History:  Diagnosis Date  . Chronic lymphocytic leukemia 09/22/2007   Qualifier: Diagnosis of  By: Jerold Coombe    . Hyperlipidemia   . Leukemia ([place])   . Osteoporosis    Past Surgical History:  Procedure Laterality Date  . CATARACT EXTRACTION  6/03   left  . CATARACT EXTRACTION  4/09   right  . TUBAL LIGATION     Family History  Problem Relation Age of Onset  . Stroke Mother   . Cancer Father     stomach  . Parkinsonism Brother   . Cancer Brother     lung  . Heart disease Brother     MI    . Alzheimer's disease Sister   . Stroke    . Alzheimer's disease    . Dementia    . Parkinsonism     History  Sexual Activity  . Sexual activity: Not Currently  . Partners: Male    Outpatient Encounter Prescriptions as of 09/07/2016  Medication Sig  . aspirin 325 MG tablet Take 325 mg by mouth daily.  . calcium carbonate (OS-CAL) 600 MG TABS Take 600 mg by mouth 2 (two) times daily with a meal.  . fish oil-omega-3 fatty acids 1000 MG capsule Take 2 g by mouth 2 (two) times daily.   . Flaxseed, Linseed, (FLAXSEED OIL PO) Take 1,400 mg by mouth daily.  . Multiple Vitamin (MULTIVITAMIN) tablet Take 1 tablet by mouth daily.  Marland Kitchen OVER THE COUNTER MEDICATION Nasal spray-Apply 1 spray to each nostril daily.  . simvastatin (ZOCOR) 40 MG tablet TAKE 1 TABLET(40 MG) BY MOUTH DAILY AT 6 PM  . [DISCONTINUED] simvastatin (ZOCOR) 40 MG tablet Take 1 tablet (40 mg total) by mouth daily at 6 PM.   No facility-administered encounter medications on file as of 09/07/2016.     Activities of Daily Living In your present state of  health, do you have any difficulty performing the following activities: 09/07/2016  Hearing? N  Vision? N  Difficulty concentrating or making decisions? N  Walking or climbing stairs? N  Dressing or bathing? N  Doing errands, shopping? N  Preparing Food and eating ? N  Using the Toilet? N  In the past six months, have you accidently leaked urine? Y  Do you have problems with loss of bowel control? N  Managing your Medications? N  Managing your Finances? N  Housekeeping or managing your Housekeeping? N  Some recent data might be hidden    Patient Care Team: Ann Held, DO as PCP - General Roxy Cedar. Dema Severin, MD (Obstetrics and Gynecology) Brunetta Genera, MD as Consulting Physician (Hematology) Crista Luria, MD as Consulting Physician (Dermatology) Radene Ou, AUD (Audiology)    Assessment:  Physical assessment deferred to PCP.   Exercise Activities  and Dietary recommendations Current Exercise Habits: Home exercise routine, Type of exercise: treadmill, Time (Minutes): 30, Frequency (Times/Week): 3, Weekly Exercise (Minutes/Week): 90, Intensity: Mild   Diet (meal preparation, eat out, water intake, caffeinated beverages, dairy products, fruits and vegetables): on average, 3 meals per day . Pt states she eats cereal or breakfast bars, fruit, chicken or [place], morning coffee, and sweet tea.  Encouraged pt to include more vegetables and water.  Goals    . Eat more fruits and vegetables    . Increase water intake      Fall Risk Fall Risk  09/07/2016 09/06/2015 03/04/2015 02/26/2014 12/18/2012  Falls in the past year? No Yes Yes No No  Number falls in past yr: - 1 1 - -  Injury with Fall? - Yes - - -  Follow up - Falls evaluation completed - - -   Depression Screen PHQ 2/9 Scores 09/07/2016 09/06/2015 03/04/2015 02/26/2014  PHQ - 2 Score 0 0 1 0     Cognitive Function MMSE - Mini Mental State Exam 09/07/2016  Orientation to time 5  Orientation to Place 5  Registration 3  Attention/ Calculation 5  Recall 2  Language- name 2 objects 2  Language- repeat 1  Language- follow 3 step command 3  Language- read & follow direction 1  Write a sentence 1  Copy design 1  Total score 29        Immunization History  Administered Date(s) Administered  . Influenza Split 08/07/2011, 07/21/2012  . Influenza Whole 09/03/2007, 08/04/2008, 08/03/2009, 08/04/2010  . Influenza,inj,Quad PF,36+ Mos 07/24/2013, 07/12/2014, 07/13/2015, 07/12/2016  . Pneumococcal Polysaccharide-23 10/04/2004, 07/21/2012  . Td 10/03/2005   Screening Tests Health Maintenance  Topic Date Due  . TETANUS/TDAP  09/07/2016 (Originally 10/04/2015)  . PNA vac Low Risk Adult (2 of 2 - PCV13) 09/05/2020 (Originally 07/21/2013)  . MAMMOGRAM  07/19/2017  . INFLUENZA VACCINE  Completed  . DEXA SCAN  Completed      Plan:    Bring a copy of your advanced directives to your  next office visit. Increase water intake and try to include more vegetables in your diet.  Continue your treadmill exercise routine. Pt declined Prevnar 13, dexa scan, and  Zostavax at this time.   During the course of the visit the patient was educated and counseled about the following appropriate screening and preventive services:   Vaccines to include Pneumoccal, Influenza, Hepatitis B, Td, Zostavax, HCV  Cardiovascular Disease  Colorectal cancer screening  Bone density screening  Diabetes screening  Glaucoma screening  Mammography/PAP  Nutrition counseling   Patient Instructions (  the written plan) was given to the patient.   Shela Nevin, [place]  09/07/2016

## 2016-09-07 NOTE — Patient Instructions (Addendum)
Follow-up with PCP as directed.  Bring a copy of your advanced directives to your next office visit. Increase water intake and try to include more vegetables in your diet.  Continue your treadmill exercise routine.

## 2016-09-07 NOTE — Progress Notes (Signed)
reviewed

## 2016-09-10 ENCOUNTER — Other Ambulatory Visit (INDEPENDENT_AMBULATORY_CARE_PROVIDER_SITE_OTHER): Payer: Medicare Other

## 2016-09-10 DIAGNOSIS — R82998 Other abnormal findings in urine: Secondary | ICD-10-CM

## 2016-09-10 DIAGNOSIS — Z0000 Encounter for general adult medical examination without abnormal findings: Secondary | ICD-10-CM | POA: Diagnosis not present

## 2016-09-10 LAB — POC URINALSYSI DIPSTICK (AUTOMATED)
Bilirubin, UA: NEGATIVE
Blood, UA: NEGATIVE
Glucose, UA: NEGATIVE
KETONES UA: NEGATIVE
Nitrite, UA: NEGATIVE
PH UA: 6
PROTEIN UA: NEGATIVE
SPEC GRAV UA: 1.015
Urobilinogen, UA: 0.2

## 2016-09-11 LAB — URINE CULTURE: Organism ID, Bacteria: NO GROWTH

## 2016-11-04 ENCOUNTER — Other Ambulatory Visit: Payer: Self-pay | Admitting: Family Medicine

## 2017-03-07 ENCOUNTER — Encounter: Payer: Self-pay | Admitting: Family Medicine

## 2017-03-07 ENCOUNTER — Ambulatory Visit (INDEPENDENT_AMBULATORY_CARE_PROVIDER_SITE_OTHER): Payer: Medicare Other | Admitting: Family Medicine

## 2017-03-07 VITALS — BP 143/65 | HR 74 | Temp 97.5°F | Resp 16 | Ht 62.0 in | Wt 130.2 lb

## 2017-03-07 DIAGNOSIS — E785 Hyperlipidemia, unspecified: Secondary | ICD-10-CM

## 2017-03-07 LAB — COMPREHENSIVE METABOLIC PANEL
ALK PHOS: 57 U/L (ref 39–117)
ALT: 14 U/L (ref 0–35)
AST: 22 U/L (ref 0–37)
Albumin: 4.3 g/dL (ref 3.5–5.2)
BILIRUBIN TOTAL: 0.5 mg/dL (ref 0.2–1.2)
BUN: 11 mg/dL (ref 6–23)
CO2: 29 mEq/L (ref 19–32)
Calcium: 9.5 mg/dL (ref 8.4–10.5)
Chloride: 102 mEq/L (ref 96–112)
Creatinine, Ser: 0.57 mg/dL (ref 0.40–1.20)
GFR: 106.45 mL/min (ref >60.00)
GLUCOSE: 93 mg/dL (ref 70–99)
Potassium: 4.4 mEq/L (ref 3.5–5.1)
SODIUM: 136 meq/L (ref 135–145)
TOTAL PROTEIN: 6.4 g/dL (ref 6.0–8.3)

## 2017-03-07 LAB — LIPID PANEL
Cholesterol: 153 mg/dL (ref 0–200)
HDL: 59.8 mg/dL (ref >39.00)
LDL Cholesterol: 73 mg/dL (ref 0–99)
NONHDL: 93.58
Total CHOL/HDL Ratio: 3
Triglycerides: 104 mg/dL (ref 0.0–149.0)
VLDL: 20.8 mg/dL (ref 0.0–40.0)

## 2017-03-07 NOTE — Patient Instructions (Signed)

## 2017-03-07 NOTE — Assessment & Plan Note (Signed)
Tolerating statin, encouraged heart healthy diet, avoid trans fats, minimize simple carbs and saturated fats. Increase exercise as tolerated 

## 2017-03-07 NOTE — Progress Notes (Signed)
Patient ID: Lindsey Patel, female   DOB: 30-Aug-1927, 83 y.o.   MRN: [id number]     Subjective:  I acted as a Education administrator for Dr. Carollee Herter.  Lindsey Patel, [name]   Patient ID: Lindsey Patel, female    DOB: 08-17-1927, 83 y.o.   MRN: [id number]  Chief Complaint  Patient presents with  . Hyperlipidemia    HPI  Patient is in today for follow up cholesterol.  She has been doing well on simvastatin and flaxseed with no complaints.   Patient Care Team: Carollee Herter, Alferd Apa, DO as PCP - General Dema Severin Roxy Cedar., MD (Obstetrics and Gynecology) Brunetta Genera, MD as Consulting Physician (Hematology) Crista Luria, MD as Consulting Physician (Dermatology) Radene Ou, AUD (Audiology)   Past Medical History:  Diagnosis Date  . Chronic lymphocytic leukemia 09/22/2007   Qualifier: Diagnosis of  By: Jerold Coombe    . Hyperlipidemia   . Leukemia ([place])   . Osteoporosis     Past Surgical History:  Procedure Laterality Date  . CATARACT EXTRACTION  6/03   left  . CATARACT EXTRACTION  4/09   right  . TUBAL LIGATION      Family History  Problem Relation Age of Onset  . Stroke Mother   . Cancer Father        stomach  . Parkinsonism Brother   . Cancer Brother        lung  . Heart disease Brother        MI  . Alzheimer's disease Sister   . Stroke Unknown   . Alzheimer's disease Unknown   . Dementia Unknown   . Parkinsonism Unknown     Social History   Social History  . Marital status: Married    Spouse name: N/A  . Number of children: N/A  . Years of education: N/A   Occupational History  . retired Proofreader Retired   Social History Main Topics  . Smoking status: Former Smoker    Packs/day: 0.50    Years: 4.00    Types: Cigarettes    Quit date: 12/13/1963  . Smokeless tobacco: Never Used  . Alcohol use No  . Drug use: No  . Sexual activity: Not Currently    Partners: Male   Other Topics Concern  . Not on file   Social History Narrative   Walk when  weather is ok    Outpatient Medications Prior to Visit  Medication Sig Dispense Refill  . aspirin 325 MG tablet Take 325 mg by mouth daily.    . calcium carbonate (OS-CAL) 600 MG TABS Take 600 mg by mouth 2 (two) times daily with a meal.    . fish oil-omega-3 fatty acids 1000 MG capsule Take 2 g by mouth 2 (two) times daily.     . Flaxseed, Linseed, (FLAXSEED OIL PO) Take 1,400 mg by mouth daily.    . Multiple Vitamin (MULTIVITAMIN) tablet Take 1 tablet by mouth daily.    Marland Kitchen OVER THE COUNTER MEDICATION Nasal spray-Apply 1 spray to each nostril daily.    . simvastatin (ZOCOR) 40 MG tablet TAKE 1 TABLET(40 MG) BY MOUTH DAILY AT 6 PM 90 tablet 1   No facility-administered medications prior to visit.     Allergies  Allergen Reactions  . Pneumococcal Vaccines Other (See Comments)    Had a local skin reaction    Review of Systems  Constitutional: Negative for fever and malaise/fatigue.  HENT: Negative for congestion.   Eyes: Negative for  blurred vision.  Respiratory: Negative for cough and shortness of breath.   Cardiovascular: Negative for chest pain, palpitations and leg swelling.  Gastrointestinal: Negative for vomiting.  Musculoskeletal: Negative for back pain.  Skin: Negative for rash.  Neurological: Negative for loss of consciousness and headaches.       Objective:    Physical Exam  Constitutional: She is oriented to person, place, and time. She appears well-developed and well-nourished. No distress.  HENT:  Head: Normocephalic and atraumatic.  Eyes: Conjunctivae and EOM are normal.  Neck: Normal range of motion. Neck supple. No JVD present. Carotid bruit is not present. No thyromegaly present.  Cardiovascular: Normal rate, regular rhythm and normal heart sounds.   No murmur heard. Pulmonary/Chest: Effort normal and breath sounds normal. No respiratory distress. She has no wheezes. She has no rales. She exhibits no tenderness.  Abdominal: Soft. Bowel sounds are normal.  There is no tenderness.  Musculoskeletal: Normal range of motion. She exhibits no edema or deformity.  Neurological: She is alert and oriented to person, place, and time.  Skin: Skin is warm and dry. She is not diaphoretic.  Psychiatric: She has a normal mood and affect. Her behavior is normal. Judgment and thought content normal.  Nursing note and vitals reviewed.   BP (!) 143/65 (BP Location: Left Arm, Cuff Size: Normal)   Pulse 74   Temp 97.5 F (36.4 C) (Oral)   Resp 16   Ht '5\' 2"'  (1.575 m)   Wt 130 lb 3.2 oz (59.1 kg)   SpO2 96%   BMI 23.81 kg/m  Wt Readings from Last 3 Encounters:  03/07/17 130 lb 3.2 oz (59.1 kg)  09/07/16 132 lb 6.4 oz (60.1 kg)  07/12/16 132 lb 1.6 oz (59.9 kg)   BP Readings from Last 3 Encounters:  03/07/17 (!) 143/65  09/07/16 140/76  07/12/16 (!) 181/79     Immunization History  Administered Date(s) Administered  . Influenza Split 08/07/2011, 07/21/2012  . Influenza Whole 09/03/2007, 08/04/2008, 08/03/2009, 08/04/2010  . Influenza,inj,Quad PF,36+ Mos 07/24/2013, 07/12/2014, 07/13/2015, 07/12/2016  . Pneumococcal Polysaccharide-23 10/04/2004, 07/21/2012  . Td 10/03/2005    Health Maintenance  Topic Date Due  . TETANUS/TDAP  10/04/2015  . PNA vac Low Risk Adult (2 of 2 - PCV13) 09/05/2020 (Originally 07/21/2013)  . INFLUENZA VACCINE  05/22/2017  . MAMMOGRAM  07/19/2017  . DEXA SCAN  Completed    Lab Results  Component Value Date   WBC 16.6 Repeated and verified X2. (H) 09/07/2016   HGB 14.1 09/07/2016   HCT 42.1 09/07/2016   PLT 222.0 09/07/2016   GLUCOSE 93 03/07/2017   CHOL 153 03/07/2017   TRIG 104.0 03/07/2017   HDL 59.80 03/07/2017   LDLDIRECT 93.9 12/08/2008   LDLCALC 73 03/07/2017   ALT 14 03/07/2017   AST 22 03/07/2017   NA 136 03/07/2017   K 4.4 03/07/2017   CL 102 03/07/2017   CREATININE 0.57 03/07/2017   BUN 11 03/07/2017   CO2 29 03/07/2017   TSH 1.50 11/07/2010    Lab Results  Component Value Date   TSH 1.50  11/07/2010   Lab Results  Component Value Date   WBC 16.6 Repeated and verified X2. (H) 09/07/2016   HGB 14.1 09/07/2016   HCT 42.1 09/07/2016   MCV 92.8 09/07/2016   PLT 222.0 09/07/2016   Lab Results  Component Value Date   NA 136 03/07/2017   K 4.4 03/07/2017   CHLORIDE 106 07/12/2016   CO2 29 03/07/2017  GLUCOSE 93 03/07/2017   BUN 11 03/07/2017   CREATININE 0.57 03/07/2017   BILITOT 0.5 03/07/2017   ALKPHOS 57 03/07/2017   AST 22 03/07/2017   ALT 14 03/07/2017   PROT 6.4 03/07/2017   ALBUMIN 4.3 03/07/2017   CALCIUM 9.5 03/07/2017   ANIONGAP 10 07/12/2016   EGFR 80 (L) 07/12/2016   GFR 106.45 03/07/2017   Lab Results  Component Value Date   CHOL 153 03/07/2017   Lab Results  Component Value Date   HDL 59.80 03/07/2017   Lab Results  Component Value Date   LDLCALC 73 03/07/2017   Lab Results  Component Value Date   TRIG 104.0 03/07/2017   Lab Results  Component Value Date   CHOLHDL 3 03/07/2017   No results found for: HGBA1C       Assessment & Plan:   Problem List Items Addressed This Visit      Unprioritized   Hyperlipidemia LDL goal <100    Tolerating statin, encouraged heart healthy diet, avoid trans fats, minimize simple carbs and saturated fats. Increase exercise as tolerated       Other Visit Diagnoses    Hyperlipidemia, unspecified hyperlipidemia type    -  Primary   Relevant Orders   Lipid panel (Completed)   Comprehensive metabolic panel (Completed)      I am having Ms. Lyford maintain her calcium carbonate, multivitamin, fish oil-omega-3 fatty acids, (Flaxseed, Linseed, (FLAXSEED OIL PO)), aspirin, OVER THE COUNTER MEDICATION, and simvastatin.  No orders of the defined types were placed in this encounter.   CMA served as Education administrator during this visit. History, Physical and Plan performed by medical provider. Documentation and orders reviewed and attested to.  Ann Held, DO

## 2017-05-01 ENCOUNTER — Other Ambulatory Visit: Payer: Self-pay | Admitting: Family Medicine

## 2017-05-14 IMAGING — CT CT L SPINE W/O CM
2 of 3 series · 11 of 33 positions shown, 13 images · non-contrast
Comparison: None.

ADDENDUM:
Incidental note is made of a 3.1 cm benign right adrenal adenoma.
CLINICAL DATA: Back pain from between the shoulder blades to the
sacrum since a fall 12 days ago.

EXAM:
CT THORACIC AND LUMBAR SPINE WITHOUT CONTRAST
TECHNIQUE: Multidetector CT imaging of the thoracic and lumbar spine was
performed without contrast. Multiplanar CT image reconstructions
were also generated.

[Series 4: spine 2.0 b31s st · axial · 0.30mm/px · z∈[-314,+94]mm · 8 of 242 slices shown, 10 images]
[im 19/242  soft-tissue]
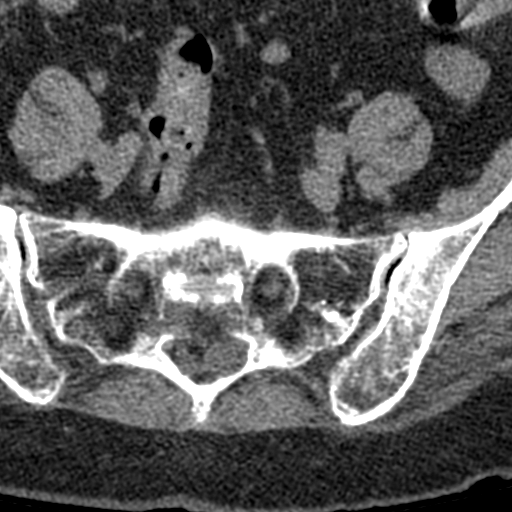
[im 19/242  bone]
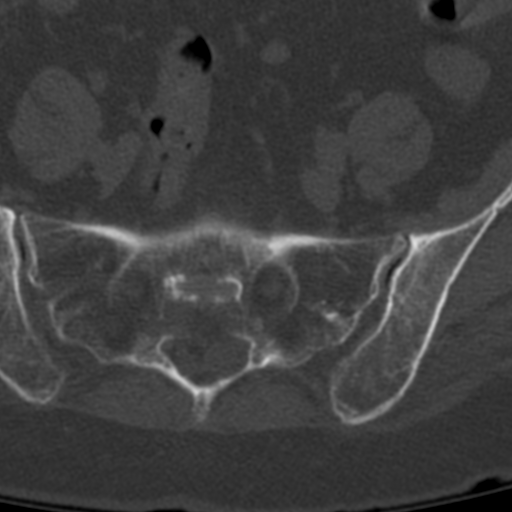
[im 56/242  bone]
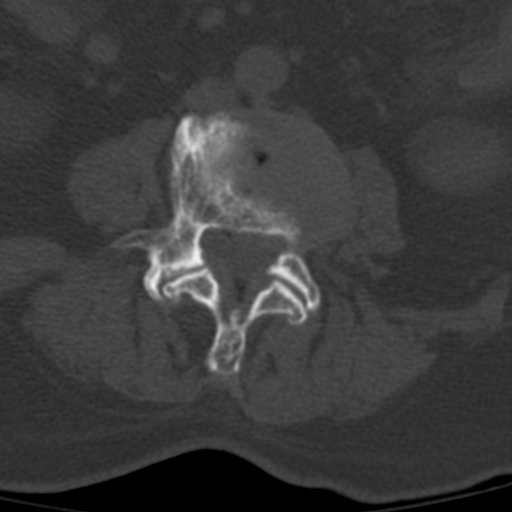
[im 75/242  bone]
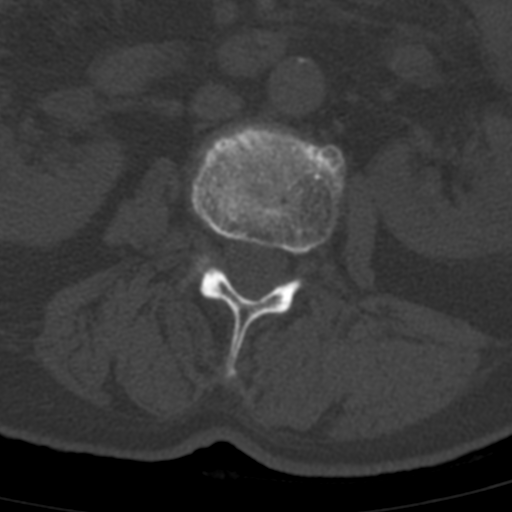
[im 112/242  bone]
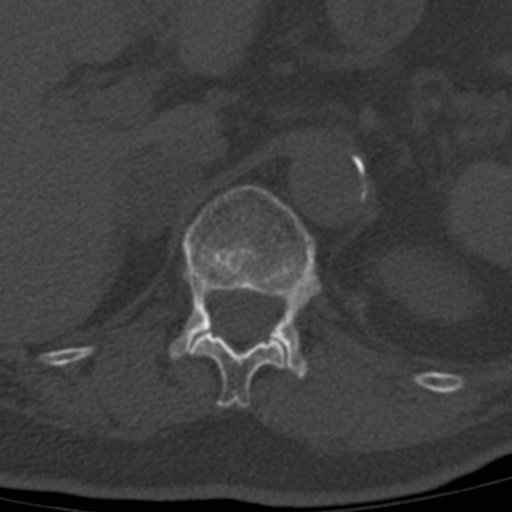
[im 130/242  soft-tissue]
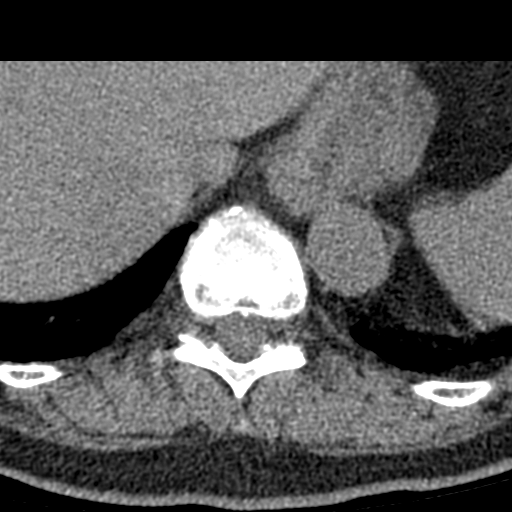
[im 130/242  bone]
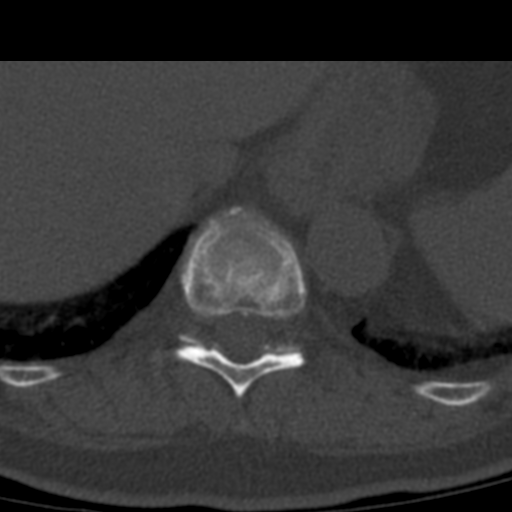
[im 167/242  bone]
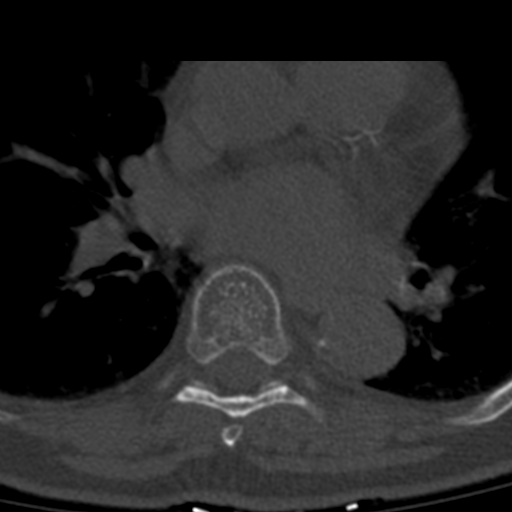
[im 186/242  bone]
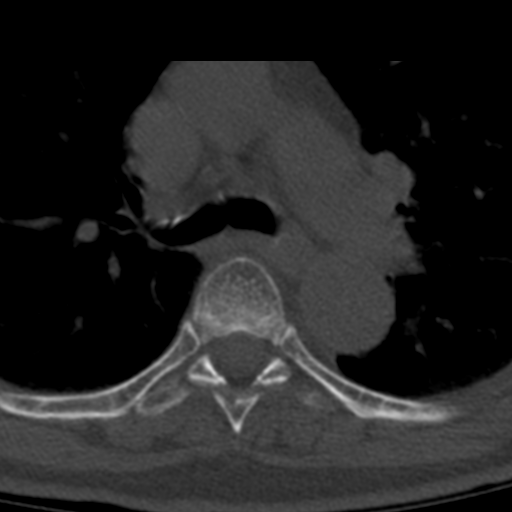
[im 223/242  bone]
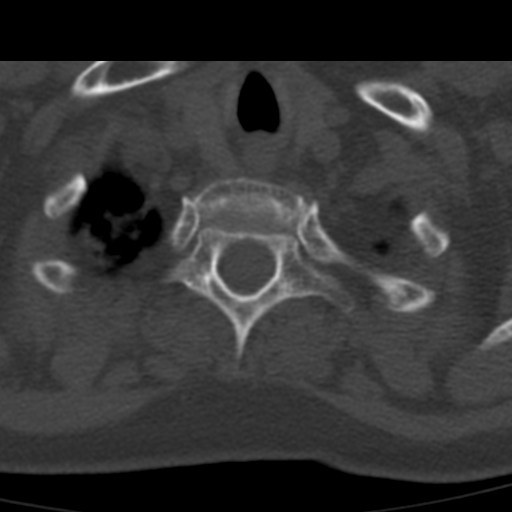

[Series 7: spine 2.0 coronal st · coronal · 0.33mm/px · 3 of 79 slices shown]
[im 16/79  bone]
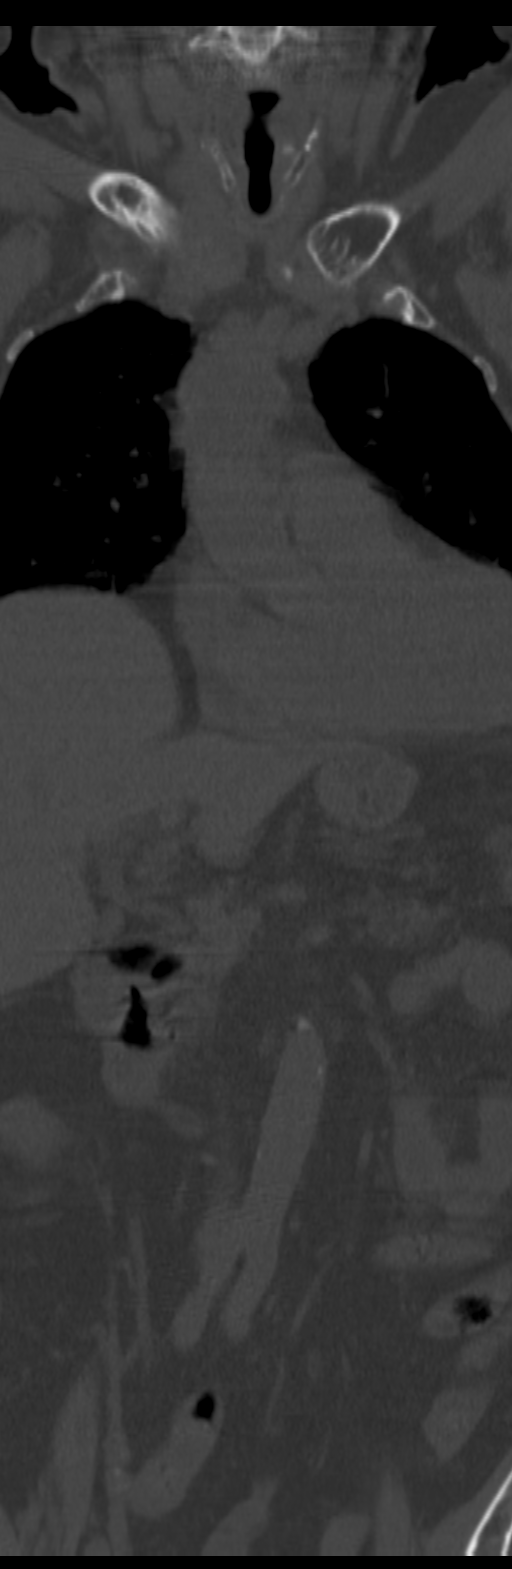
[im 32/79  bone]
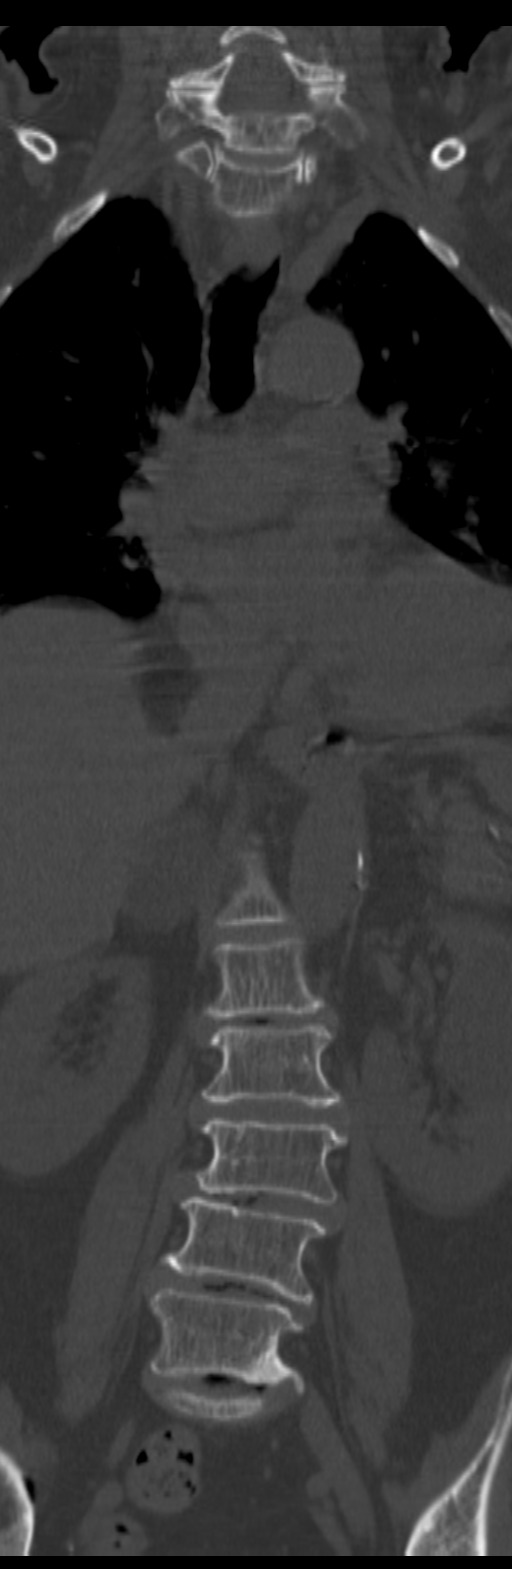
[im 47/79  bone]
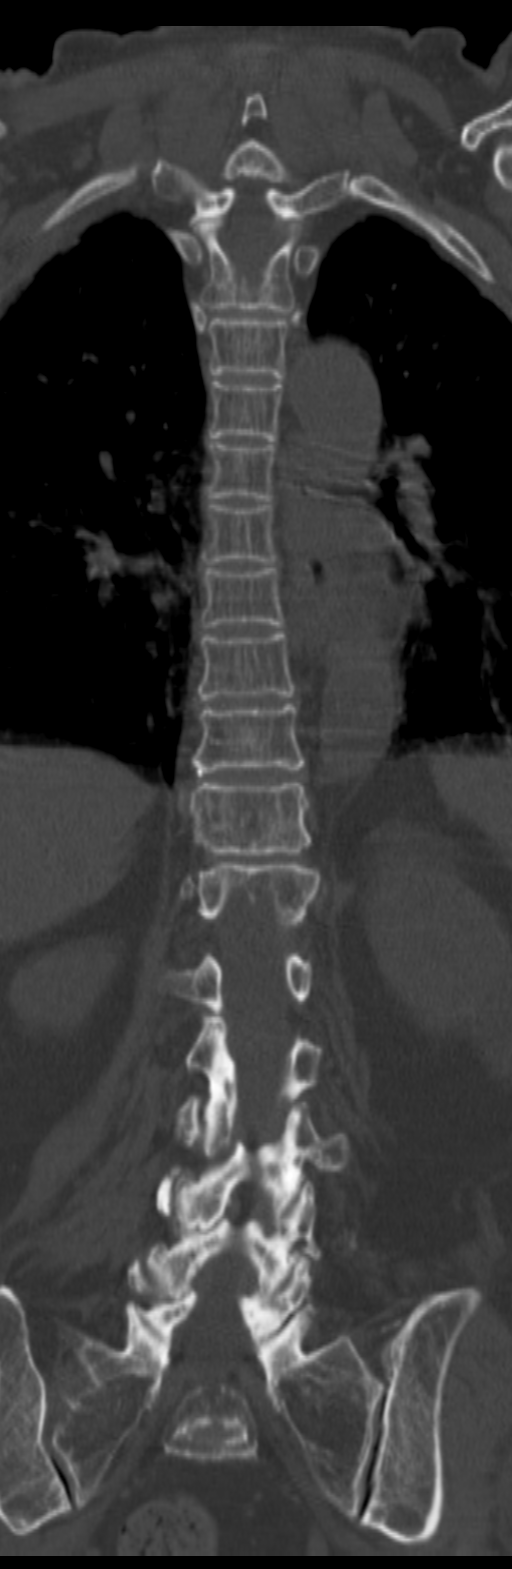

[11 of 33 positions shown; findings below may reference images not displayed]

FINDINGS: CT THORACIC SPINE FINDINGS

There is no fracture, bone destruction, disc space narrowing, facet
arthritis, or foraminal or spinal stenosis. The paraspinal soft
tissues are normal.

CT LUMBAR SPINE FINDINGS

T12-L1:  Normal.

L1-2: Broad-based small soft disc protrusion with disc space
narrowing and a vacuum phenomena and. No neural impingement.

L2-3:  Small broad-based disc bulge with no neural impingement.

L3-4: Broad-based disc protrusion extending into both neural
foramina and far laterally on the right. No neural impingement.
Slight narrowing of both lateral recesses. Small herniation of gas
into the spinal canal with a protrusion.

L4-5: Broad-based soft disc protrusion with disc space narrowing and
a vacuum phenomenon. Moderate left facet joint arthritis and
hypertrophy with left foraminal stenosis. Slight right facet
arthritis.

L5-S1: Disc space narrowing. Small broad-based soft disc protrusion.
Moderate left foraminal stenosis without focal neural impingement.
IMPRESSION: CT THORACIC SPINE IMPRESSION

No significant abnormality of the thoracic spine.

CT LUMBAR SPINE IMPRESSION

Multilevel degenerative disc disease with disc protrusions from L1-2
through L5-S1 with the exception of a small broad-based bulge at
L2-3. No acute osseous abnormality. Left foraminal stenosis at L4-5
and L5-S1.

The first 3 sacral segments are visualized and are normal. Slight
degenerative changes of both sacroiliac joints.

## 2017-07-10 ENCOUNTER — Other Ambulatory Visit: Payer: Self-pay | Admitting: *Deleted

## 2017-07-10 DIAGNOSIS — D72829 Elevated white blood cell count, unspecified: Secondary | ICD-10-CM

## 2017-07-10 NOTE — Progress Notes (Signed)
[facility]  HEMATOLOGY ONCOLOGY PROGRESS NOTE  Date of service: 07/11/2017  Patient Care Team: Carollee Herter, Alferd Apa, DO as PCP - General Dema Severin Roxy Cedar., MD (Obstetrics and Gynecology) Brunetta Genera, MD as Consulting Physician (Hematology) Crista Luria, MD as Consulting Physician (Dermatology) Radene Ou, AUD (Audiology)  CC: Follow-up for CLL  Diagnosis: Rai Stage 0 CLL  Current Treatment: Monitoring/Observation.  INTERVAL HISTORY:  Lindsey Patel is here for her scheduled follow-up of CLL after her last clinic visit with me about a year ago. She has been doing well overall.  She asks about the shingles vaccine. She has not received a shingles vaccine before.  She has previously received a pneumonia vaccine and had a slight reaction to it but is unsure which pneumonia vaccine it was. She will talk more about these two vaccinations with her PCP in November. She is interested in receiving a flu vaccination today in our clinic.  Denies fevers, chills, or night sweats. Denies any new enlarged lymph nodes. Denies any major changes in energy level. Denies abnormal abdominal swelling.  REVIEW OF SYSTEMS:    10 Point review of systems of done and is negative except as noted above.  . Past Medical History:  Diagnosis Date  . Chronic lymphocytic leukemia 09/22/2007   Qualifier: Diagnosis of  By: Jerold Coombe    . Hyperlipidemia   . Leukemia ([place])   . Osteoporosis     . Past Surgical History:  Procedure Laterality Date  . CATARACT EXTRACTION  6/03   left  . CATARACT EXTRACTION  4/09   right  . TUBAL LIGATION      . Social History  Substance Use Topics  . Smoking status: Former Smoker    Packs/day: 0.50    Years: 4.00    Types: Cigarettes    Quit date: 12/13/1963  . Smokeless tobacco: Never Used  . Alcohol use No    ALLERGIES:  is allergic to pneumococcal vaccines.  MEDICATIONS:  Current Outpatient Prescriptions  Medication Sig Dispense Refill  . aspirin 325  MG tablet Take 325 mg by mouth daily.    . calcium carbonate (OS-CAL) 600 MG TABS Take 600 mg by mouth 2 (two) times daily with a meal.    . fish oil-omega-3 fatty acids 1000 MG capsule Take 2 g by mouth 2 (two) times daily.     . Flaxseed, Linseed, (FLAXSEED OIL PO) Take 1,400 mg by mouth daily.    . Multiple Vitamin (MULTIVITAMIN) tablet Take 1 tablet by mouth daily.    Marland Kitchen OVER THE COUNTER MEDICATION Nasal spray-Apply 1 spray to each nostril daily.    . simvastatin (ZOCOR) 40 MG tablet TAKE 1 TABLET(40 MG) BY MOUTH DAILY AT 6 PM 90 tablet 1   No current facility-administered medications for this visit.     PHYSICAL EXAMINATION: ECOG PERFORMANCE STATUS: 2 - Symptomatic, <50% confined to bed  Vitals:   07/11/17 1047  BP: (!) 188/82  Pulse: 76  Resp: 18  Temp: 97.9 F (36.6 C)  SpO2: 97%   Filed Weights   07/11/17 1047  Weight: 128 lb 4.8 oz (58.2 kg)   .Body mass index is 23.47 kg/m.  GENERAL:alert, in no acute distress and comfortable SKIN: skin color, texture, turgor are normal, no rashes or significant lesions EYES: normal, conjunctiva are pink and non-injected, sclera clear OROPHARYNX:no exudate, no erythema and lips, buccal mucosa, and tongue normal  NECK: supple, no JVD, thyroid normal size, non-tender, without nodularity LYMPH:  no palpable  lymphadenopathy in the cervical, axillary or inguinal LUNGS: clear to auscultation with normal respiratory effort HEART: regular rate & rhythm,  no murmurs and no lower extremity edema ABDOMEN: abdomen soft, non-tender, normoactive bowel sounds. No hepatosplenomegaly.  Musculoskeletal: no cyanosis of digits and no clubbing  PSYCH: alert & oriented x 3 with fluent speech NEURO: no focal motor/sensory deficits  LABORATORY DATA:   I have reviewed the data as listed  . CBC Latest Ref Rng & Units 07/11/2017 09/07/2016 07/12/2016  WBC 3.9 - 10.3 10e3/uL 15.7(H) 16.6 Repeated and verified X2.(H) 14.8(H)  Hemoglobin 11.6 - 15.9 g/dL  13.6 14.1 13.7  Hematocrit 34.8 - 46.6 % 41.0 42.1 40.3  Platelets 145 - 400 10e3/uL 208 222.0 212   . CBC    Component Value Date/Time   WBC 15.7 (H) 07/11/2017 1035   WBC 16.6 Repeated and verified X2. (H) 09/07/2016 1021   RBC 4.41 07/11/2017 1035   RBC 4.54 09/07/2016 1021   HGB 13.6 07/11/2017 1035   HCT 41.0 07/11/2017 1035   PLT 208 07/11/2017 1035   MCV 93.0 07/11/2017 1035   MCH 30.9 07/11/2017 1035   MCHC 33.2 07/11/2017 1035   MCHC 33.5 09/07/2016 1021   RDW 13.1 07/11/2017 1035   LYMPHSABS 10.2 (H) 07/11/2017 1035   MONOABS 0.7 07/11/2017 1035   EOSABS 0.4 07/11/2017 1035   BASOSABS 0.1 07/11/2017 1035    . CMP Latest Ref Rng & Units 07/11/2017 03/07/2017 09/07/2016  Glucose 70 - 140 mg/dl 88 93 88  BUN 7.0 - 26.0 mg/dL 8.6 11 12   Creatinine 0.6 - 1.1 mg/dL 0.6 0.57 0.54  Sodium 136 - 145 mEq/L 139 136 141  Potassium 3.5 - 5.1 mEq/L 4.3 4.4 4.1  Chloride 96 - 112 mEq/L - 102 105  CO2 22 - 29 mEq/L 25 29 28   Calcium 8.4 - 10.4 mg/dL 9.6 9.5 9.7  Total Protein 6.4 - 8.3 g/dL 6.3(L) 6.4 6.4  Total Bilirubin 0.20 - 1.20 mg/dL 0.37 0.5 0.5  Alkaline Phos 40 - 150 U/L 71 57 57  AST 5 - 34 U/L 22 22 23   ALT 0 - 55 U/L 14 14 18    . Lab Results  Component Value Date   LDH 172 07/12/2016     flow cytometry 07/13/2015 :       RADIOGRAPHIC STUDIES: I have personally reviewed the radiological images as listed and agreed with the findings in the report. No results found.  ASSESSMENT & PLAN:    83 year old Caucasian female with  #1 Rai stage 0 CLL with stable lymphocytosis. No lymphadenopathy. No hepatosplenomegaly. No cytopenias. No constitutional symptoms. Patient's CBC today shows fairly stable lymphocyte count with no significant cytopenias. LDH is within normal limits. Plan -Patient's labs are stable and were discussed with her and her accompanying son. -Patient has no indications to warrant treatment of her CLL at this time. -No issues with  infections to suggest the need for IVIG -Continue follow-up with primary care physician for other cares.  -She received a flu shot in our clinic today as per her request. -CLL prognostic fish panel sent out- showed 13q deletion which is in keeping with indolent course the patient has had an typically suggests good prognosis. -Would anticipate that she would get labs with her primary care physician in about 6 months and then follow-up with [place] with labs and 12 months.  She will receive a flu vaccination in our clinic today.  Return to clinic in 1 year with Dr. Irene Limbo  with CBC with differential and CMP. Earlier if any change in symptoms. Lymphadenopathy or hepatosplenomegaly or change in her CBC that is significant.  Recheck CBC with differential with primary care physician in 6 months.  I spent 20 minutes counseling the patient face to face. The total time spent in the appointment was 25 minutes and more than 50% was on counseling and direct patient cares.  This document serves as a record of services personally performed by Sullivan Lone, MD. It was created on his behalf by Arlyce Harman, a trained medical scribe. The creation of this record is based on the scribe's personal observations and the provider's statements to them. This document has been checked and approved by the attending provider.   Sullivan Lone MD [place] AAHIVMS [facility] Hematology/Oncology Physician [facility]  (Office):       [phone] (Work cell):  [phone] (Fax):           [phone]

## 2017-07-11 ENCOUNTER — Telehealth: Payer: Self-pay | Admitting: Hematology

## 2017-07-11 ENCOUNTER — Ambulatory Visit (HOSPITAL_BASED_OUTPATIENT_CLINIC_OR_DEPARTMENT_OTHER): Payer: Medicare Other | Admitting: Hematology

## 2017-07-11 ENCOUNTER — Other Ambulatory Visit (HOSPITAL_BASED_OUTPATIENT_CLINIC_OR_DEPARTMENT_OTHER): Payer: Medicare Other

## 2017-07-11 VITALS — BP 188/82 | HR 76 | Temp 97.9°F | Resp 18 | Ht 62.0 in | Wt 128.3 lb

## 2017-07-11 DIAGNOSIS — Z23 Encounter for immunization: Secondary | ICD-10-CM

## 2017-07-11 DIAGNOSIS — C9110 Chronic lymphocytic leukemia of B-cell type not having achieved remission: Secondary | ICD-10-CM

## 2017-07-11 DIAGNOSIS — D72829 Elevated white blood cell count, unspecified: Secondary | ICD-10-CM

## 2017-07-11 LAB — COMPREHENSIVE METABOLIC PANEL
ALBUMIN: 3.6 g/dL (ref 3.5–5.0)
ALK PHOS: 71 U/L (ref 40–150)
ALT: 14 U/L (ref 0–55)
AST: 22 U/L (ref 5–34)
Anion Gap: 7 mEq/L (ref 3–11)
BUN: 8.6 mg/dL (ref 7.0–26.0)
CALCIUM: 9.6 mg/dL (ref 8.4–10.4)
CO2: 25 mEq/L (ref 22–29)
CREATININE: 0.6 mg/dL (ref 0.6–1.1)
Chloride: 106 mEq/L (ref 98–109)
EGFR: 80 mL/min/{1.73_m2} — ABNORMAL LOW (ref >90)
Glucose: 88 mg/dl (ref 70–140)
Potassium: 4.3 mEq/L (ref 3.5–5.1)
Sodium: 139 mEq/L (ref 136–145)
Total Bilirubin: 0.37 mg/dL (ref 0.20–1.20)
Total Protein: 6.3 g/dL — ABNORMAL LOW (ref 6.4–8.3)

## 2017-07-11 LAB — CBC WITH DIFFERENTIAL/PLATELET
BASO%: 0.7 % (ref 0.0–2.0)
BASOS ABS: 0.1 10*3/uL (ref 0.0–0.1)
EOS%: 2.4 % (ref 0.0–7.0)
Eosinophils Absolute: 0.4 10*3/uL (ref 0.0–0.5)
HEMATOCRIT: 41 % (ref 34.8–46.6)
HEMOGLOBIN: 13.6 g/dL (ref 11.6–15.9)
LYMPH%: 65.1 % — ABNORMAL HIGH (ref 14.0–49.7)
MCH: 30.9 pg (ref 25.1–34.0)
MCHC: 33.2 g/dL (ref 31.5–36.0)
MCV: 93 fL (ref 79.5–101.0)
MONO#: 0.7 10*3/uL (ref 0.1–0.9)
MONO%: 4.6 % (ref 0.0–14.0)
NEUT#: 4.3 10*3/uL (ref 1.5–6.5)
NEUT%: 27.2 % — ABNORMAL LOW (ref 38.4–76.8)
Platelets: 208 10*3/uL (ref 145–400)
RBC: 4.41 10*6/uL (ref 3.70–5.45)
RDW: 13.1 % (ref 11.2–14.5)
WBC: 15.7 10*3/uL — ABNORMAL HIGH (ref 3.9–10.3)
lymph#: 10.2 10*3/uL — ABNORMAL HIGH (ref 0.9–3.3)

## 2017-07-11 LAB — TECHNOLOGIST REVIEW

## 2017-07-11 MED ORDER — INFLUENZA VAC SPLIT QUAD 0.5 ML IM SUSY
0.5000 mL | PREFILLED_SYRINGE | Freq: Once | INTRAMUSCULAR | Status: AC
  Administered 2017-07-11: 0.5 mL via INTRAMUSCULAR
  Filled 2017-07-11: qty 0.5

## 2017-07-11 NOTE — Telephone Encounter (Signed)
Gave avs and calendar for September 2019 °

## 2017-07-16 ENCOUNTER — Ambulatory Visit (HOSPITAL_BASED_OUTPATIENT_CLINIC_OR_DEPARTMENT_OTHER)
Admission: RE | Admit: 2017-07-16 | Discharge: 2017-07-16 | Disposition: A | Discharge status: Home / Self Care | Payer: Medicare Other | Source: Ambulatory Visit | Attending: Family Medicine | Admitting: Family Medicine

## 2017-07-16 ENCOUNTER — Ambulatory Visit (INDEPENDENT_AMBULATORY_CARE_PROVIDER_SITE_OTHER): Payer: Medicare Other | Admitting: Family Medicine

## 2017-07-16 ENCOUNTER — Encounter: Payer: Self-pay | Admitting: Family Medicine

## 2017-07-16 VITALS — BP 124/68 | Wt 129.0 lb

## 2017-07-16 DIAGNOSIS — J3489 Other specified disorders of nose and nasal sinuses: Secondary | ICD-10-CM | POA: Diagnosis present

## 2017-07-16 DIAGNOSIS — S0010XA Contusion of unspecified eyelid and periocular area, initial encounter: Secondary | ICD-10-CM | POA: Insufficient documentation

## 2017-07-16 DIAGNOSIS — X58XXXA Exposure to other specified factors, initial encounter: Secondary | ICD-10-CM | POA: Insufficient documentation

## 2017-07-16 NOTE — Patient Instructions (Signed)
Fall Prevention in Hospitals, Adult WHAT ARE SOME SAFETY TIPS FOR PREVENTING FALLS? If you or a loved one has to stay in the hospital, talk with the health care providers about the risk of falling. Find out which medicines or treatments can cause dizziness or affect balance. Make a plan with the health care providers to prevent falls. The plan may include these points:  Ask for help moving around, especially after surgery or when feeling unwell.  Have support available when getting up and moving around.  Wear nonskid footwear.  Use the safety straps on wheelchairs.  Ask for help to get objects that are out of reach.  Wear eyeglasses.  Remove all clutter from the floor and the sides of the bed.  Keep equipment and wires securely out of the way.  Keep the bed locked in the low position.  Keep the side rails up on the bed.  Keep the nurse call button within reach.  Keep the door open when no one else is in the room.  Have someone stay in the hospital with you or your loved one.  Ask for a bed alarm if you are not able to stay with your loved one who is at risk for getting up without help.  Ask if sleeping pills or other medicines that alter mental status are necessary. WHAT INCREASES THE RISK FOR FALLS? Certain conditions and treatments may increase a patient's risk for falls in a hospital. These include:  Being in an unfamiliar environment.  Being on bed rest.  Having surgery.  Taking certain medicines, such as sleeping pills.  Having tubes in place, such as IV lines or catheters. Additional risk factors for falls in a hospital include:  Having vision problems.  Having a change in thinking, feeling, or behavior (altered mental status).  Having trouble with balance.  Needing to use the toilet frequently.  Having fallen in the past three months.  Having low blood pressure when standing up quickly (orthostatic hypotension). WHAT DOES THE HOSPITAL STAFF DO TO HELP  PREVENT ME OR MY LOVED ONE FROM FALLING? Hospitals have systems in place to prevent falls and accidents. Talk with the hospital staff about:  Doing an assessment to discuss fall risks and create a personalized plan to prevent falls.  Checking in regularly to see if help is needed for moving around and to assess any changes in fall risk.  Knowing where the nurse call button is and how to use it. Use this to call a nursing care provider any time.  Keeping personal items within reach. This includes eyeglasses, phones, and other electronic devices.  Following general safety guidelines when moving around.  Keeping the area around the bed free from clutter.  Removing unnecessary equipment or tubes to reduce the risk of tripping.  Using safety equipment, such as:  Walkers, crutches, and other walking devices for support.  Safety rails on the bed.  Safety straps in the bed.  A bed that can be lowered and locked to prevent movement.  Handrails in the bathroom.  Nonskid socks and shoes.  Locking mechanisms to secure equipment in place.  Lifting and transfer equipment. WHAT CAN I DO TO HELP PREVENT A FALL?  Talk with health care providers about fall prevention.  Have a personalized fall prevention plan in place.  Do not try to move around if you feel off balance or ill.  Change position slowly.  Sit on the side of the bed before standing up.  Sit down and call   for help if you feel dizzy or unsteady when standing.  Keep the hospital room clear of clutter. WHEN SHOULD I ASK FOR HELP? Ask for help whenever you:  Are not sure if you are able to move around safely.  Feel dizzy or unsteady.  Are not comfortable helping your loved one move around or use the bathroom. If you or a loved one falls, tell the hospital staff. This is important. This information is not intended to replace advice given to you by your health care provider. Make sure you discuss any questions you have  with your health care provider. Document Released: 10/05/2000 Document Revised: 03/06/2016 Document Reviewed: 07/21/2015 Elsevier Interactive Patient Education  2017 [employer]  

## 2017-07-16 NOTE — Progress Notes (Signed)
Patient ID: Lindsey Patel, female    DOB: October 14, 1927  Age: 83 y.o. MRN: [id number]    Subjective:  Subjective  HPI Lindsey Patel presents for f/u from fall.  She states she tripped on uneven pavement and hit face and hand.  Pt c/o of no pain.  No vision changes     Review of Systems  Constitutional: Negative for appetite change, diaphoresis, fatigue and unexpected weight change.  Eyes: Negative for pain, redness and visual disturbance.  Respiratory: Negative for cough, chest tightness, shortness of breath and wheezing.   Cardiovascular: Negative for chest pain, palpitations and leg swelling.  Endocrine: Negative for cold intolerance, heat intolerance, polydipsia, polyphagia and polyuria.  Genitourinary: Negative for difficulty urinating, dysuria and frequency.  Skin: Positive for wound.  Neurological: Negative for dizziness, light-headedness, numbness and headaches.    History Past Medical History:  Diagnosis Date  . Chronic lymphocytic leukemia 09/22/2007   Qualifier: Diagnosis of  By: Jerold Coombe    . Hyperlipidemia   . Leukemia ([place])   . Osteoporosis     She has a past surgical history that includes Tubal ligation; Cataract extraction (6/03); and Cataract extraction (4/09).   Her family history includes Alzheimer's disease in her sister and unknown relative; Cancer in her brother and father; Dementia in her unknown relative; Heart disease in her brother; Parkinsonism in her brother and unknown relative; Stroke in her mother and unknown relative.She reports that she quit smoking about 53 years ago. Her smoking use included Cigarettes. She has a 2.00 pack-year smoking history. She has never used smokeless tobacco. She reports that she does not drink alcohol or use drugs.  Current Outpatient Prescriptions on File Prior to Visit  Medication Sig Dispense Refill  . aspirin 325 MG tablet Take 325 mg by mouth daily.    . calcium carbonate (OS-CAL) 600 MG TABS Take 600 mg by mouth  2 (two) times daily with a meal.    . fish oil-omega-3 fatty acids 1000 MG capsule Take 2 g by mouth 2 (two) times daily.     . Flaxseed, Linseed, (FLAXSEED OIL PO) Take 1,400 mg by mouth daily.    . Multiple Vitamin (MULTIVITAMIN) tablet Take 1 tablet by mouth daily.    Marland Kitchen OVER THE COUNTER MEDICATION Nasal spray-Apply 1 spray to each nostril daily.    . simvastatin (ZOCOR) 40 MG tablet TAKE 1 TABLET(40 MG) BY MOUTH DAILY AT 6 PM 90 tablet 1   No current facility-administered medications on file prior to visit.      Objective:  Objective  Physical Exam  Constitutional: She is oriented to person, place, and time. She appears well-developed and well-nourished.  HENT:  Head: Normocephalic. Head is with raccoon's eyes.    Eyes: Pupils are equal, round, and reactive to light. Conjunctivae and EOM are normal.  Neck: Normal range of motion. Neck supple. No JVD present. Carotid bruit is not present. No thyromegaly present.  Cardiovascular: Normal rate, regular rhythm and normal heart sounds.   No murmur heard. Pulmonary/Chest: Effort normal and breath sounds normal. No respiratory distress. She has no wheezes. She has no rales. She exhibits no tenderness.  Musculoskeletal: She exhibits no edema.  Neurological: She is alert and oriented to person, place, and time.  Skin: Ecchymosis noted.     Psychiatric: She has a normal mood and affect.  Nursing note and vitals reviewed.  BP 124/68   Wt 129 lb (58.5 kg)   BMI 23.59 kg/m  Wt Readings  from Last 3 Encounters:  07/16/17 129 lb (58.5 kg)  07/11/17 128 lb 4.8 oz (58.2 kg)  03/07/17 130 lb 3.2 oz (59.1 kg)     Lab Results  Component Value Date   WBC 15.7 (H) 07/11/2017   HGB 13.6 07/11/2017   HCT 41.0 07/11/2017   PLT 208 07/11/2017   GLUCOSE 88 07/11/2017   CHOL 153 03/07/2017   TRIG 104.0 03/07/2017   HDL 59.80 03/07/2017   LDLDIRECT 93.9 12/08/2008   LDLCALC 73 03/07/2017   ALT 14 07/11/2017   AST 22 07/11/2017   NA 139  07/11/2017   K 4.3 07/11/2017   CL 102 03/07/2017   CREATININE 0.6 07/11/2017   BUN 8.6 07/11/2017   CO2 25 07/11/2017   TSH 1.50 11/07/2010    No results found.   Assessment & Plan:  Plan  I am having Lindsey Patel maintain her calcium carbonate, multivitamin, fish oil-omega-3 fatty acids, (Flaxseed, Linseed, (FLAXSEED OIL PO)), aspirin, OVER THE COUNTER MEDICATION, and simvastatin.  No orders of the defined types were placed in this encounter.   Problem List Items Addressed This Visit    None    Visit Diagnoses    Nose pain    -  Primary   Relevant Orders   DG Facial Bones Complete (Completed)   Black eye, unspecified laterality, initial encounter       Relevant Orders   DG Facial Bones Complete (Completed)      Follow-up: Return if symptoms worsen or fail to improve.  Ann Held, DO

## 2017-07-17 ENCOUNTER — Other Ambulatory Visit: Payer: Self-pay | Admitting: Family Medicine

## 2017-07-17 DIAGNOSIS — S022XXA Fracture of nasal bones, initial encounter for closed fracture: Secondary | ICD-10-CM

## 2017-07-29 ENCOUNTER — Other Ambulatory Visit: Payer: Self-pay | Admitting: Family Medicine

## 2017-07-29 NOTE — Telephone Encounter (Signed)
On 7.11.18 #[id number] was faxed/thx dmf

## 2017-09-10 ENCOUNTER — Encounter: Payer: Self-pay | Admitting: Family Medicine

## 2017-09-10 ENCOUNTER — Ambulatory Visit: Payer: Medicare Other | Admitting: Family Medicine

## 2017-09-10 VITALS — BP 122/78 | HR 95 | Temp 98.2°F | Resp 16 | Ht 62.0 in | Wt 130.4 lb

## 2017-09-10 DIAGNOSIS — E785 Hyperlipidemia, unspecified: Secondary | ICD-10-CM

## 2017-09-10 DIAGNOSIS — Z0000 Encounter for general adult medical examination without abnormal findings: Secondary | ICD-10-CM

## 2017-09-10 LAB — COMPREHENSIVE METABOLIC PANEL
ALBUMIN: 4.1 g/dL (ref 3.5–5.2)
ALK PHOS: 65 U/L (ref 39–117)
ALT: 16 U/L (ref 0–35)
AST: 26 U/L (ref 0–37)
BILIRUBIN TOTAL: 0.5 mg/dL (ref 0.2–1.2)
BUN: 9 mg/dL (ref 6–23)
CALCIUM: 9.5 mg/dL (ref 8.4–10.5)
CO2: 28 mEq/L (ref 19–32)
Chloride: 102 mEq/L (ref 96–112)
Creatinine, Ser: 0.47 mg/dL (ref 0.40–1.20)
GFR: 132.83 mL/min (ref >60.00)
Glucose, Bld: 91 mg/dL (ref 70–99)
Potassium: 4.3 mEq/L (ref 3.5–5.1)
Sodium: 137 mEq/L (ref 135–145)
TOTAL PROTEIN: 6.3 g/dL (ref 6.0–8.3)

## 2017-09-10 LAB — CBC WITH DIFFERENTIAL/PLATELET
BASOS ABS: 0.1 10*3/uL (ref 0.0–0.1)
Basophils Relative: 0.3 % (ref 0.0–3.0)
Eosinophils Absolute: 0.2 10*3/uL (ref 0.0–0.7)
Eosinophils Relative: 1.4 % (ref 0.0–5.0)
HEMATOCRIT: 40.2 % (ref 36.0–46.0)
HEMOGLOBIN: 13.4 g/dL (ref 12.0–15.0)
Lymphs Abs: 10.3 10*3/uL — ABNORMAL HIGH (ref 0.7–4.0)
MCHC: 33.4 g/dL (ref 30.0–36.0)
MCV: 93.9 fl (ref 78.0–100.0)
MONOS PCT: 4.2 % (ref 3.0–12.0)
Monocytes Absolute: 0.7 10*3/uL (ref 0.1–1.0)
NEUTROS ABS: 4.8 10*3/uL (ref 1.4–7.7)
Neutrophils Relative %: 29.8 % — ABNORMAL LOW (ref 43.0–77.0)
Platelets: 211 10*3/uL (ref 150.0–400.0)
RBC: 4.28 Mil/uL (ref 3.87–5.11)
RDW: 13.5 % (ref 11.5–15.5)
WBC: 16.1 10*3/uL — ABNORMAL HIGH (ref 4.0–10.5)

## 2017-09-10 LAB — LIPID PANEL
CHOLESTEROL: 145 mg/dL (ref 0–200)
HDL: 59.2 mg/dL (ref >39.00)
LDL Cholesterol: 67 mg/dL (ref 0–99)
NonHDL: 85.92
TRIGLYCERIDES: 93 mg/dL (ref 0.0–149.0)
Total CHOL/HDL Ratio: 2
VLDL: 18.6 mg/dL (ref 0.0–40.0)

## 2017-09-10 NOTE — Progress Notes (Signed)
Subjective:     Lindsey Patel is a 83 y.o. female and is here for a comprehensive physical exam. The patient reports no problems.  Social History   Socioeconomic History  . Marital status: Married    Spouse name: Not on file  . Number of children: Not on file  . Years of education: Not on file  . Highest education level: Not on file  Social Needs  . Financial resource strain: Not on file  . Food insecurity - worry: Not on file  . Food insecurity - inability: Not on file  . Transportation needs - medical: Not on file  . Transportation needs - non-medical: Not on file  Occupational History  . Occupation: retired Patent attorney: RETIRED  Tobacco Use  . Smoking status: Former Smoker    Packs/day: 0.50    Years: 4.00    Pack years: 2.00    Types: Cigarettes    Last attempt to quit: 12/13/1963    Years since quitting: 53.7  . Smokeless tobacco: Never Used  Substance and Sexual Activity  . Alcohol use: No  . Drug use: No  . Sexual activity: Not Currently    Partners: Male  Other Topics Concern  . Not on file  Social History Narrative   Walk when weather is ok   Health Maintenance  Topic Date Due  . TETANUS/TDAP  10/04/2015  . MAMMOGRAM  07/19/2017  . PNA vac Low Risk Adult (2 of 2 - PCV13) 09/05/2020 (Originally 07/21/2013)  . INFLUENZA VACCINE  Completed  . DEXA SCAN  Completed    The following portions of the patient's history were reviewed and updated as appropriate:  She  has a past medical history of Chronic lymphocytic leukemia (09/22/2007), Hyperlipidemia, Leukemia ([place]), and Osteoporosis. She does not have any pertinent problems on file. She  has a past surgical history that includes Tubal ligation; Cataract extraction (6/03); and Cataract extraction (4/09). Her family history includes Alzheimer's disease in her sister and unknown relative; Cancer in her brother and father; Dementia in her unknown relative; Heart disease in her brother; Parkinsonism  in her brother and unknown relative; Stroke in her mother and unknown relative. She  reports that she quit smoking about 53 years ago. Her smoking use included cigarettes. She has a 2.00 pack-year smoking history. she has never used smokeless tobacco. She reports that she does not drink alcohol or use drugs. She has a current medication list which includes the following prescription(s): aspirin, calcium carbonate, fish oil-omega-3 fatty acids, flaxseed (linseed), multivitamin, OVER THE COUNTER MEDICATION, and simvastatin. Current Outpatient Medications on File Prior to Visit  Medication Sig Dispense Refill  . aspirin 325 MG tablet Take 325 mg by mouth daily.    . calcium carbonate (OS-CAL) 600 MG TABS Take 600 mg by mouth 2 (two) times daily with a meal.    . fish oil-omega-3 fatty acids 1000 MG capsule Take 2 g by mouth 2 (two) times daily.     . Flaxseed, Linseed, (FLAXSEED OIL PO) Take 1,400 mg by mouth daily.    . Multiple Vitamin (MULTIVITAMIN) tablet Take 1 tablet by mouth daily.    Marland Kitchen OVER THE COUNTER MEDICATION Nasal spray-Apply 1 spray to each nostril daily.    . simvastatin (ZOCOR) 40 MG tablet TAKE 1 TABLET(40 MG) BY MOUTH DAILY AT 6 PM 90 tablet 1   No current facility-administered medications on file prior to visit.    She is allergic to pneumococcal vaccines.Marland Kitchen  Review of Systems Review of Systems  Constitutional: Negative for activity change, appetite change and fatigue.  HENT: Negative for hearing loss, congestion, tinnitus and ear discharge.  dentist q21m Eyes: Negative for visual disturbance (see optho q1y -- vision corrected to 20/20 with glasses).  Respiratory: Negative for cough, chest tightness and shortness of breath.   Cardiovascular: Negative for chest pain, palpitations and leg swelling.  Gastrointestinal: Negative for abdominal pain, diarrhea, constipation and abdominal distention.  Genitourinary: Negative for urgency, frequency, decreased urine volume and difficulty  urinating.  Musculoskeletal: Negative for back pain, arthralgias and gait problem.  Skin: Negative for color change, pallor and rash.  Neurological: Negative for dizziness, light-headedness, numbness and headaches.  Hematological: Negative for adenopathy. Does not bruise/bleed easily.  Psychiatric/Behavioral: Negative for suicidal ideas, confusion, sleep disturbance, self-injury, dysphoric mood, decreased concentration and agitation.       Objective:    BP 122/78 (BP Location: Left Arm, Cuff Size: Normal)   Pulse 95   Temp 98.2 F (36.8 C) (Oral)   Resp 16   Ht 5\' 2"  (1.575 m)   Wt 130 lb 6.4 oz (59.1 kg)   SpO2 96%   BMI 23.85 kg/m  General appearance: alert, cooperative, appears stated age and no distress Head: Normocephalic, without obvious abnormality, atraumatic Eyes: negative findings: lids and lashes normal and pupils equal, round, reactive to light and accomodation Ears: normal TM's and external ear canals both ears Nose: Nares normal. Septum midline. Mucosa normal. No drainage or sinus tenderness. Throat: lips, mucosa, and tongue normal; teeth and gums normal Neck: no adenopathy, no carotid bruit, no JVD, supple, symmetrical, trachea midline and thyroid not enlarged, symmetric, no tenderness/mass/nodules Back: symmetric, no curvature. ROM normal. No CVA tenderness. Lungs: clear to auscultation bilaterally Breasts: gyn Heart: regular rate and rhythm, S1, S2 normal, no murmur, click, rub or gallop Abdomen: soft, non-tender; bowel sounds normal; no masses,  no organomegaly Pelvic: deferred--gyn Extremities: extremities normal, atraumatic, no cyanosis or edema Pulses: 2+ and symmetric Skin: Skin color, texture, turgor normal. No rashes or lesions Lymph nodes: Cervical, supraclavicular, and axillary nodes normal. Neurologic: Alert and oriented X 3, normal strength and tone. Normal symmetric reflexes. Normal coordination and gait    Assessment:    Healthy female exam.       Plan:    ghm utd Check labs See After Visit Summary for Counseling Recommendations    1. Hyperlipidemia LDL goal <100 Tolerating statin, encouraged heart healthy diet, avoid trans fats, minimize simple carbs and saturated fats. Increase exercise as tolerated - CBC with Differential/Platelet - POCT Urinalysis Dipstick (Automated) - Comprehensive metabolic panel - Lipid panel  2. Encounter for Medicare annual wellness exam See above

## 2017-09-10 NOTE — Progress Notes (Signed)
Subjective:   Lindsey Patel is a 83 y.o. female who presents for Medicare Annual (Subsequent) preventive examination.  Review of Systems:  No ROS.  Medicare Wellness Visit. Additional risk factors are reflected in the social history.  Cardiac Risk Factors include: advanced age (>50men, >37 women);dyslipidemia Sleep patterns: Pt states she likes to stay up late. Sleeps 6-8 hrs. Feels rested.   Female:   Mammo- last 07/19/16-normal      Dexa scan- No longer doing routine screening due to age.           Objective:     Vitals: BP 122/78 (BP Location: Left Arm, Cuff Size: Normal)   Pulse 95   Temp 98.2 F (36.8 C) (Oral)   Resp 16   Ht 5\' 2"  (1.575 m)   Wt 130 lb 6.4 oz (59.1 kg)   SpO2 96%   BMI 23.85 kg/m   Body mass index is 23.85 kg/m.   Tobacco Social History   Tobacco Use  Smoking Status Former Smoker  . Packs/day: 0.50  . Years: 4.00  . Pack years: 2.00  . Types: Cigarettes  . Last attempt to quit: 12/13/1963  . Years since quitting: 53.7  Smokeless Tobacco Never Used     Counseling given: Not Answered   Past Medical History:  Diagnosis Date  . Chronic lymphocytic leukemia 09/22/2007   Qualifier: Diagnosis of  By: Jerold Coombe    . Hyperlipidemia   . Leukemia ([place])   . Osteoporosis    Past Surgical History:  Procedure Laterality Date  . CATARACT EXTRACTION  6/03   left  . CATARACT EXTRACTION  4/09   right  . TUBAL LIGATION     Family History  Problem Relation Age of Onset  . Stroke Mother   . Cancer Father        stomach  . Parkinsonism Brother   . Cancer Brother        lung  . Heart disease Brother        MI  . Alzheimer's disease Sister   . Stroke Unknown   . Alzheimer's disease Unknown   . Dementia Unknown   . Parkinsonism Unknown    Social History   Substance and Sexual Activity  Sexual Activity Not Currently  . Partners: Male    Outpatient Encounter Medications as of 09/10/2017  Medication Sig  . aspirin 325 MG  tablet Take 325 mg by mouth daily.  . calcium carbonate (OS-CAL) 600 MG TABS Take 600 mg by mouth 2 (two) times daily with a meal.  . fish oil-omega-3 fatty acids 1000 MG capsule Take 2 g by mouth 2 (two) times daily.   . Flaxseed, Linseed, (FLAXSEED OIL PO) Take 1,400 mg by mouth daily.  . Multiple Vitamin (MULTIVITAMIN) tablet Take 1 tablet by mouth daily.  Marland Kitchen OVER THE COUNTER MEDICATION Nasal spray-Apply 1 spray to each nostril daily.  . simvastatin (ZOCOR) 40 MG tablet TAKE 1 TABLET(40 MG) BY MOUTH DAILY AT 6 PM   No facility-administered encounter medications on file as of 09/10/2017.     Activities of Daily Living In your present state of health, do you have any difficulty performing the following activities: 09/10/2017  Hearing? Y  Comment wearing hearing aids  Vision? N  Comment wearing glasses. eye doctor yearly  Difficulty concentrating or making decisions? Y  Walking or climbing stairs? N  Dressing or bathing? N  Doing errands, shopping? N  Preparing Food and eating ? N  Using the  Toilet? N  In the past six months, have you accidently leaked urine? N  Do you have problems with loss of bowel control? N  Managing your Medications? N  Managing your Finances? N  Housekeeping or managing your Housekeeping? N  Some recent data might be hidden    Patient Care Team: Carollee Herter, Alferd Apa, DO as PCP - General Dema Severin Roxy Cedar., MD (Obstetrics and Gynecology) Brunetta Genera, MD as Consulting Physician (Hematology) Jari Pigg, MD as Consulting Physician (Dermatology)    Assessment:    Physical assessment deferred to PCP.  Exercise Activities and Dietary recommendations Exercise limited by: None identified Diet (meal preparation, eat out, water intake, caffeinated beverages, dairy products, fruits and vegetables): well balanced  Goals    . Maintain health and independence (pt-stated)      Fall Risk Fall Risk  09/10/2017 09/07/2016 09/06/2015 03/04/2015 02/26/2014    Falls in the past year? Yes No Yes Yes No  Number falls in past yr: 1 - 1 1 -  Comment - - - stepped off of a step, no injury -  Injury with Fall? Yes - Yes - -  Follow up Falls prevention discussed;Education provided - Falls evaluation completed - -  Comment - - see visit for fall - -   Depression Screen PHQ 2/9 Scores 09/10/2017 09/07/2016 09/06/2015 03/04/2015  PHQ - 2 Score 0 0 0 1     Cognitive Function MMSE - Mini Mental State Exam 09/10/2017 09/07/2016  Orientation to time 5 5  Orientation to Place 5 5  Registration 3 3  Attention/ Calculation 5 5  Recall 1 2  Language- name 2 objects 2 2  Language- repeat 1 1  Language- follow 3 step command 3 3  Language- read & follow direction 1 1  Write a sentence 1 1  Copy design 1 1  Total score 28 29        Immunization History  Administered Date(s) Administered  . Influenza Split 08/07/2011, 07/21/2012  . Influenza Whole 09/03/2007, 08/04/2008, 08/03/2009, 08/04/2010  . Influenza,inj,Quad PF,6+ Mos 07/24/2013, 07/12/2014, 07/13/2015, 07/12/2016, 07/11/2017  . Pneumococcal Polysaccharide-23 10/04/2004, 07/21/2012  . Td 10/03/2005   Screening Tests Health Maintenance  Topic Date Due  . TETANUS/TDAP  10/04/2015  . MAMMOGRAM  07/19/2017  . PNA vac Low Risk Adult (2 of 2 - PCV13) 09/05/2020 (Originally 07/21/2013)  . INFLUENZA VACCINE  Completed  . DEXA SCAN  Completed      Plan:   Follow up with PCP as directed  Continue to eat heart healthy diet (full of fruits, vegetables, whole grains, lean protein, water--limit salt, fat, and sugar intake) and increase physical activity as tolerated.  Continue doing brain stimulating activities (puzzles, reading, adult coloring books, staying active) to keep memory sharp.   Bring a copy of your living will and/or healthcare power of attorney to your next office visit.    I have personally reviewed and noted the following in the patient's chart:   . Medical and social  history . Use of alcohol, tobacco or illicit drugs  . Current medications and supplements . Functional ability and status . Nutritional status . Physical activity . Advanced directives . List of other physicians . Hospitalizations, surgeries, and ER visits in previous 12 months . Vitals . Screenings to include cognitive, depression, and falls . Referrals and appointments  In addition, I have reviewed and discussed with patient certain preventive protocols, quality metrics, and best practice recommendations. A written personalized care plan for preventive  services as well as general preventive health recommendations were provided to patient.     Shela Nevin, [place]  09/10/2017

## 2017-09-10 NOTE — Patient Instructions (Signed)
Preventive Care 81 Years and Older, Female Preventive care refers to lifestyle choices and visits with your health care provider that can promote health and wellness. What does preventive care include?  A yearly physical exam. This is also called an annual well check.  Dental exams once or twice a year.  Routine eye exams. Ask your health care provider how often you should have your eyes checked.  Personal lifestyle choices, including: ? Daily care of your teeth and gums. ? Regular physical activity. ? Eating a healthy diet. ? Avoiding tobacco and drug use. ? Limiting alcohol use. ? Practicing safe sex. ? Taking low-dose aspirin every day. ? Taking vitamin and mineral supplements as recommended by your health care provider. What happens during an annual well check? The services and screenings done by your health care provider during your annual well check will depend on your age, overall health, lifestyle risk factors, and family history of disease. Counseling Your health care provider may ask you questions about your:  Alcohol use.  Tobacco use.  Drug use.  Emotional well-being.  Home and relationship well-being.  Sexual activity.  Eating habits.  History of falls.  Memory and ability to understand (cognition).  Work and work environment.  Reproductive health.  Screening You may have the following tests or measurements:  Height, weight, and BMI.  Blood pressure.  Lipid and cholesterol levels. These may be checked every 5 years, or more frequently if you are over 50 years old.  Skin check.  Lung cancer screening. You may have this screening every year starting at age 55 if you have a 30-pack-year history of smoking and currently smoke or have quit within the past 15 years.  Fecal occult blood test (FOBT) of the stool. You may have this test every year starting at age 50.  Flexible sigmoidoscopy or colonoscopy. You may have a sigmoidoscopy every 5 years or  a colonoscopy every 10 years starting at age 50.  Hepatitis C blood test.  Hepatitis B blood test.  Sexually transmitted disease (STD) testing.  Diabetes screening. This is done by checking your blood sugar (glucose) after you have not eaten for a while (fasting). You may have this done every 1-3 years.  Bone density scan. This is done to screen for osteoporosis. You may have this done starting at age 65.  Mammogram. This may be done every 1-2 years. Talk to your health care provider about how often you should have regular mammograms.  Talk with your health care provider about your test results, treatment options, and if necessary, the need for more tests. Vaccines Your health care provider may recommend certain vaccines, such as:  Influenza vaccine. This is recommended every year.  Tetanus, diphtheria, and acellular pertussis (Tdap, Td) vaccine. You may need a Td booster every 10 years.  Varicella vaccine. You may need this if you have not been vaccinated.  Zoster vaccine. You may need this after age 60.  Measles, mumps, and rubella (MMR) vaccine. You may need at least one dose of MMR if you were born in 1957 or later. You may also need a second dose.  Pneumococcal 13-valent conjugate (PCV13) vaccine. One dose is recommended after age 65.  Pneumococcal polysaccharide (PPSV23) vaccine. One dose is recommended after age 65.  Meningococcal vaccine. You may need this if you have certain conditions.  Hepatitis A vaccine. You may need this if you have certain conditions or if you travel or work in places where you may be exposed to hepatitis   A.  Hepatitis B vaccine. You may need this if you have certain conditions or if you travel or work in places where you may be exposed to hepatitis B.  Haemophilus influenzae type b (Hib) vaccine. You may need this if you have certain conditions.  Talk to your health care provider about which screenings and vaccines you need and how often you  need them. This information is not intended to replace advice given to you by your health care provider. Make sure you discuss any questions you have with your health care provider. Document Released: 11/04/2015 Document Revised: 06/27/2016 Document Reviewed: 08/09/2015 Elsevier Interactive Patient Education  2017 [employer].

## 2017-09-11 ENCOUNTER — Telehealth: Payer: Self-pay | Admitting: *Deleted

## 2017-09-11 NOTE — Telephone Encounter (Signed)
Attempted to reach pt and left detailed message on voicemail with results per 09/10/17 lab result note.  Copied from [place] [phone]. Topic: Quick Communication - Lab Results >> Sep 11, 2017  3:39 PM Wynetta Emery, [place] C wrote: Pt is calling in to return call, pt says that she think it was for lab results. Pt would like to have results mailed out to her.

## 2017-09-24 NOTE — Telephone Encounter (Signed)
Patient advised she should receive results in a few days.

## 2017-09-24 NOTE — Telephone Encounter (Signed)
Results mailed out to pt at her request.

## 2017-10-28 ENCOUNTER — Other Ambulatory Visit: Payer: Self-pay | Admitting: Family Medicine

## 2018-01-23 ENCOUNTER — Other Ambulatory Visit: Payer: Self-pay | Admitting: Family Medicine

## 2018-02-20 ENCOUNTER — Ambulatory Visit: Payer: Medicare Other | Admitting: Family Medicine

## 2018-02-20 ENCOUNTER — Encounter: Payer: Self-pay | Admitting: Family Medicine

## 2018-02-20 VITALS — BP 130/80 | HR 89 | Temp 98.1°F | Resp 16 | Ht 62.0 in | Wt 126.4 lb

## 2018-02-20 DIAGNOSIS — E785 Hyperlipidemia, unspecified: Secondary | ICD-10-CM | POA: Diagnosis not present

## 2018-02-20 LAB — LIPID PANEL
Cholesterol: 141 mg/dL (ref 0–200)
HDL: 56.7 mg/dL (ref >39.00)
LDL Cholesterol: 66 mg/dL (ref 0–99)
NonHDL: 84.74
Total CHOL/HDL Ratio: 2
Triglycerides: 95 mg/dL (ref 0.0–149.0)
VLDL: 19 mg/dL (ref 0.0–40.0)

## 2018-02-20 LAB — COMPREHENSIVE METABOLIC PANEL
ALBUMIN: 4.1 g/dL (ref 3.5–5.2)
ALK PHOS: 60 U/L (ref 39–117)
ALT: 14 U/L (ref 0–35)
AST: 23 U/L (ref 0–37)
BILIRUBIN TOTAL: 0.3 mg/dL (ref 0.2–1.2)
BUN: 8 mg/dL (ref 6–23)
CO2: 26 mEq/L (ref 19–32)
CREATININE: 0.54 mg/dL (ref 0.40–1.20)
Calcium: 9.2 mg/dL (ref 8.4–10.5)
Chloride: 104 mEq/L (ref 96–112)
GFR: 113.05 mL/min (ref >60.00)
Glucose, Bld: 111 mg/dL — ABNORMAL HIGH (ref 70–99)
POTASSIUM: 3.8 meq/L (ref 3.5–5.1)
SODIUM: 139 meq/L (ref 135–145)
TOTAL PROTEIN: 6.2 g/dL (ref 6.0–8.3)

## 2018-02-20 NOTE — Patient Instructions (Signed)

## 2018-02-20 NOTE — Progress Notes (Signed)
Patient ID: Lindsey Patel, female   DOB: 07-Oct-1927, 84 y.o.   MRN: [id number]    Subjective:  I acted as a Education administrator for Dr. Carollee Herter.  [name], [place]   Patient ID: Lindsey Patel, female    DOB: May 04, 1927, 84 y.o.   MRN: [id number]  Chief Complaint  Patient presents with  . Hyperlipidemia    HPI  Patient is in today for follow up cholesterol.  She is doing well on current treatment.  No complaints.    Patient Care Team: Carollee Herter, Alferd Apa, DO as PCP - General Dema Severin Roxy Cedar., MD (Obstetrics and Gynecology) Brunetta Genera, MD as Consulting Physician (Hematology) Jari Pigg, MD as Consulting Physician (Dermatology)   Past Medical History:  Diagnosis Date  . Chronic lymphocytic leukemia 09/22/2007   Qualifier: Diagnosis of  By: Jerold Coombe    . Hyperlipidemia   . Leukemia ([place])   . Osteoporosis     Past Surgical History:  Procedure Laterality Date  . CATARACT EXTRACTION  6/03   left  . CATARACT EXTRACTION  4/09   right  . TUBAL LIGATION      Family History  Problem Relation Age of Onset  . Stroke Mother   . Cancer Father        stomach  . Parkinsonism Brother   . Cancer Brother        lung  . Heart disease Brother        MI  . Alzheimer's disease Sister   . Stroke Unknown   . Alzheimer's disease Unknown   . Dementia Unknown   . Parkinsonism Unknown     Social History   Socioeconomic History  . Marital status: Married    Spouse name: Not on file  . Number of children: Not on file  . Years of education: Not on file  . Highest education level: Not on file  Occupational History  . Occupation: retired Patent attorney: RETIRED  Social Needs  . Financial resource strain: Not on file  . Food insecurity:    Worry: Not on file    Inability: Not on file  . Transportation needs:    Medical: Not on file    Non-medical: Not on file  Tobacco Use  . Smoking status: Former Smoker    Packs/day: 0.50    Years: 4.00    Pack years:  2.00    Types: Cigarettes    Last attempt to quit: 12/13/1963    Years since quitting: 54.2  . Smokeless tobacco: Never Used  Substance and Sexual Activity  . Alcohol use: No  . Drug use: No  . Sexual activity: Not Currently    Partners: Male  Lifestyle  . Physical activity:    Days per week: Not on file    Minutes per session: Not on file  . Stress: Not on file  Relationships  . Social connections:    Talks on phone: Not on file    Gets together: Not on file    Attends religious service: Not on file    Active member of club or organization: Not on file    Attends meetings of clubs or organizations: Not on file    Relationship status: Not on file  . Intimate partner violence:    Fear of current or ex partner: Not on file    Emotionally abused: Not on file    Physically abused: Not on file    Forced sexual activity: Not on  file  Other Topics Concern  . Not on file  Social History Narrative   Walk when weather is ok    Outpatient Medications Prior to Visit  Medication Sig Dispense Refill  . aspirin EC 81 MG tablet Take 81 mg by mouth daily.    . calcium carbonate (OS-CAL) 600 MG TABS Take 600 mg by mouth 2 (two) times daily with a meal.    . fish oil-omega-3 fatty acids 1000 MG capsule Take 2 g by mouth 2 (two) times daily.     . Flaxseed, Linseed, (FLAXSEED OIL PO) Take 1,400 mg by mouth daily.    . Multiple Vitamin (MULTIVITAMIN) tablet Take 1 tablet by mouth daily.    Marland Kitchen OVER THE COUNTER MEDICATION Nasal spray-Apply 1 spray to each nostril daily.    . simvastatin (ZOCOR) 40 MG tablet TAKE 1 TABLET BY MOUTH EVERY DAY AT 6 PM 90 tablet 0  . aspirin 325 MG tablet Take 325 mg by mouth daily.     No facility-administered medications prior to visit.     Allergies  Allergen Reactions  . Pneumococcal Vaccines Other (See Comments)    Had a local skin reaction    Review of Systems  Constitutional: Negative for fever and malaise/fatigue.  HENT: Negative for congestion.     Eyes: Negative for blurred vision.  Respiratory: Negative for cough and shortness of breath.   Cardiovascular: Negative for chest pain, palpitations and leg swelling.  Gastrointestinal: Negative for vomiting.  Musculoskeletal: Negative for back pain.  Skin: Negative for rash.  Neurological: Negative for loss of consciousness and headaches.       Objective:    Physical Exam  Constitutional: She is oriented to person, place, and time. She appears well-developed and well-nourished.  HENT:  Head: Normocephalic and atraumatic.  Eyes: Conjunctivae and EOM are normal.  Neck: Normal range of motion. Neck supple. No JVD present. Carotid bruit is not present. No thyromegaly present.  Cardiovascular: Normal rate, regular rhythm and normal heart sounds.  No murmur heard. Pulmonary/Chest: Effort normal and breath sounds normal. No respiratory distress. She has no wheezes. She has no rales. She exhibits no tenderness.  Musculoskeletal: She exhibits no edema.  Neurological: She is alert and oriented to person, place, and time.  Psychiatric: She has a normal mood and affect.  Nursing note and vitals reviewed.   BP 130/80 (BP Location: Right Arm, Cuff Size: Normal)   Pulse 89   Temp 98.1 F (36.7 C) (Oral)   Resp 16   Ht '5\' 2"'  (1.575 m)   Wt 126 lb 6.4 oz (57.3 kg)   SpO2 96%   BMI 23.12 kg/m  Wt Readings from Last 3 Encounters:  02/20/18 126 lb 6.4 oz (57.3 kg)  09/10/17 130 lb 6.4 oz (59.1 kg)  07/16/17 129 lb (58.5 kg)   BP Readings from Last 3 Encounters:  02/20/18 130/80  09/10/17 122/78  07/16/17 124/68     Immunization History  Administered Date(s) Administered  . Influenza Split 08/07/2011, 07/21/2012  . Influenza Whole 09/03/2007, 08/04/2008, 08/03/2009, 08/04/2010  . Influenza,inj,Quad PF,6+ Mos 07/24/2013, 07/12/2014, 07/13/2015, 07/12/2016, 07/11/2017  . Pneumococcal Polysaccharide-23 10/04/2004, 07/21/2012  . Td 10/03/2005    Health Maintenance  Topic Date Due   . TETANUS/TDAP  10/04/2015  . MAMMOGRAM  07/19/2017  . PNA vac Low Risk Adult (2 of 2 - PCV13) 09/05/2020 (Originally 07/21/2013)  . INFLUENZA VACCINE  05/22/2018  . DEXA SCAN  Completed    Lab Results  Component Value  Date   WBC 16.1 (H) 09/10/2017   HGB 13.4 09/10/2017   HCT 40.2 09/10/2017   PLT 211.0 09/10/2017   GLUCOSE 111 (H) 02/20/2018   CHOL 141 02/20/2018   TRIG 95.0 02/20/2018   HDL 56.70 02/20/2018   LDLDIRECT 93.9 12/08/2008   LDLCALC 66 02/20/2018   ALT 14 02/20/2018   AST 23 02/20/2018   NA 139 02/20/2018   K 3.8 02/20/2018   CL 104 02/20/2018   CREATININE 0.54 02/20/2018   BUN 8 02/20/2018   CO2 26 02/20/2018   TSH 1.50 11/07/2010    Lab Results  Component Value Date   TSH 1.50 11/07/2010   Lab Results  Component Value Date   WBC 16.1 (H) 09/10/2017   HGB 13.4 09/10/2017   HCT 40.2 09/10/2017   MCV 93.9 09/10/2017   PLT 211.0 09/10/2017   Lab Results  Component Value Date   NA 139 02/20/2018   K 3.8 02/20/2018   CHLORIDE 106 07/11/2017   CO2 26 02/20/2018   GLUCOSE 111 (H) 02/20/2018   BUN 8 02/20/2018   CREATININE 0.54 02/20/2018   BILITOT 0.3 02/20/2018   ALKPHOS 60 02/20/2018   AST 23 02/20/2018   ALT 14 02/20/2018   PROT 6.2 02/20/2018   ALBUMIN 4.1 02/20/2018   CALCIUM 9.2 02/20/2018   ANIONGAP 7 07/11/2017   EGFR 80 (L) 07/11/2017   GFR 113.05 02/20/2018   Lab Results  Component Value Date   CHOL 141 02/20/2018   Lab Results  Component Value Date   HDL 56.70 02/20/2018   Lab Results  Component Value Date   LDLCALC 66 02/20/2018   Lab Results  Component Value Date   TRIG 95.0 02/20/2018   Lab Results  Component Value Date   CHOLHDL 2 02/20/2018   No results found for: HGBA1C       Assessment & Plan:   Problem List Items Addressed This Visit      Unprioritized   Hyperlipidemia LDL goal <100 - Primary    Tolerating statin, encouraged heart healthy diet, avoid trans fats, minimize simple carbs and saturated  fats. Increase exercise as tolerated      Relevant Medications   aspirin EC 81 MG tablet   Other Relevant Orders   Comprehensive metabolic panel (Completed)   Lipid panel (Completed)      I have discontinued Aitana C. Gruszka's aspirin. I am also having her maintain her calcium carbonate, multivitamin, fish oil-omega-3 fatty acids, (Flaxseed, Linseed, (FLAXSEED OIL PO)), OVER THE COUNTER MEDICATION, simvastatin, and aspirin EC.  No orders of the defined types were placed in this encounter.   CMA served as Education administrator during this visit. History, Physical and Plan performed by medical provider. Documentation and orders reviewed and attested to.  Ann Held, DO

## 2018-02-20 NOTE — Assessment & Plan Note (Signed)
Tolerating statin, encouraged heart healthy diet, avoid trans fats, minimize simple carbs and saturated fats. Increase exercise as tolerated 

## 2018-02-21 ENCOUNTER — Encounter: Payer: Self-pay | Admitting: *Deleted

## 2018-04-01 ENCOUNTER — Emergency Department (HOSPITAL_COMMUNITY): Payer: Medicare Other

## 2018-04-01 ENCOUNTER — Inpatient Hospital Stay (HOSPITAL_COMMUNITY)
Admission: EM | Admit: 2018-04-01 | Discharge: 2018-04-06 | DRG: 956 | Disposition: A | Discharge status: Skilled Nursing Facility | Payer: Medicare Other | Attending: Internal Medicine | Admitting: Internal Medicine

## 2018-04-01 ENCOUNTER — Encounter (HOSPITAL_COMMUNITY): Payer: Self-pay

## 2018-04-01 DIAGNOSIS — S72009A Fracture of unspecified part of neck of unspecified femur, initial encounter for closed fracture: Secondary | ICD-10-CM

## 2018-04-01 DIAGNOSIS — T1490XA Injury, unspecified, initial encounter: Secondary | ICD-10-CM

## 2018-04-01 DIAGNOSIS — S72002A Fracture of unspecified part of neck of left femur, initial encounter for closed fracture: Principal | ICD-10-CM | POA: Diagnosis present

## 2018-04-01 DIAGNOSIS — R52 Pain, unspecified: Secondary | ICD-10-CM

## 2018-04-01 DIAGNOSIS — S32512A Fracture of superior rim of left pubis, initial encounter for closed fracture: Secondary | ICD-10-CM | POA: Diagnosis present

## 2018-04-01 DIAGNOSIS — S51012A Laceration without foreign body of left elbow, initial encounter: Secondary | ICD-10-CM | POA: Diagnosis present

## 2018-04-01 DIAGNOSIS — S52032A Displaced fracture of olecranon process with intraarticular extension of left ulna, initial encounter for closed fracture: Secondary | ICD-10-CM | POA: Diagnosis present

## 2018-04-01 DIAGNOSIS — Z800 Family history of malignant neoplasm of digestive organs: Secondary | ICD-10-CM

## 2018-04-01 DIAGNOSIS — S51019A Laceration without foreign body of unspecified elbow, initial encounter: Secondary | ICD-10-CM

## 2018-04-01 DIAGNOSIS — W101XXA Fall (on)(from) sidewalk curb, initial encounter: Secondary | ICD-10-CM | POA: Diagnosis present

## 2018-04-01 DIAGNOSIS — M810 Age-related osteoporosis without current pathological fracture: Secondary | ICD-10-CM | POA: Diagnosis present

## 2018-04-01 DIAGNOSIS — S52022A Displaced fracture of olecranon process without intraarticular extension of left ulna, initial encounter for closed fracture: Secondary | ICD-10-CM | POA: Diagnosis present

## 2018-04-01 DIAGNOSIS — S42409A Unspecified fracture of lower end of unspecified humerus, initial encounter for closed fracture: Secondary | ICD-10-CM

## 2018-04-01 DIAGNOSIS — Y92480 Sidewalk as the place of occurrence of the external cause: Secondary | ICD-10-CM

## 2018-04-01 DIAGNOSIS — D696 Thrombocytopenia, unspecified: Secondary | ICD-10-CM | POA: Diagnosis not present

## 2018-04-01 DIAGNOSIS — D62 Acute posthemorrhagic anemia: Secondary | ICD-10-CM | POA: Diagnosis not present

## 2018-04-01 DIAGNOSIS — S329XXA Fracture of unspecified parts of lumbosacral spine and pelvis, initial encounter for closed fracture: Secondary | ICD-10-CM | POA: Diagnosis present

## 2018-04-01 DIAGNOSIS — Z823 Family history of stroke: Secondary | ICD-10-CM

## 2018-04-01 DIAGNOSIS — Z820 Family history of epilepsy and other diseases of the nervous system: Secondary | ICD-10-CM

## 2018-04-01 DIAGNOSIS — S42402A Unspecified fracture of lower end of left humerus, initial encounter for closed fracture: Secondary | ICD-10-CM

## 2018-04-01 DIAGNOSIS — Z87891 Personal history of nicotine dependence: Secondary | ICD-10-CM

## 2018-04-01 DIAGNOSIS — Z7982 Long term (current) use of aspirin: Secondary | ICD-10-CM

## 2018-04-01 DIAGNOSIS — C9110 Chronic lymphocytic leukemia of B-cell type not having achieved remission: Secondary | ICD-10-CM | POA: Diagnosis present

## 2018-04-01 DIAGNOSIS — W19XXXA Unspecified fall, initial encounter: Secondary | ICD-10-CM

## 2018-04-01 DIAGNOSIS — Z8249 Family history of ischemic heart disease and other diseases of the circulatory system: Secondary | ICD-10-CM

## 2018-04-01 DIAGNOSIS — R739 Hyperglycemia, unspecified: Secondary | ICD-10-CM | POA: Diagnosis present

## 2018-04-01 DIAGNOSIS — T148XXA Other injury of unspecified body region, initial encounter: Secondary | ICD-10-CM

## 2018-04-01 DIAGNOSIS — E785 Hyperlipidemia, unspecified: Secondary | ICD-10-CM | POA: Diagnosis present

## 2018-04-01 DIAGNOSIS — Z801 Family history of malignant neoplasm of trachea, bronchus and lung: Secondary | ICD-10-CM

## 2018-04-01 HISTORY — DX: Unspecified fracture of lower end of unspecified humerus, initial encounter for closed fracture: S42.409A

## 2018-04-01 HISTORY — DX: Fracture of unspecified part of neck of unspecified femur, initial encounter for closed fracture: S72.009A

## 2018-04-01 NOTE — ED Triage Notes (Signed)
Pt arrived via [facility] from [place] after fall around 8pm.  Pt hit left elbow, and is c/o left groin pain.  Denies blood thinners, head, neck or back pain.

## 2018-04-01 NOTE — ED Notes (Signed)
Pt stat 87% on RA, placed on 3L

## 2018-04-01 NOTE — ED Provider Notes (Signed)
Emergency Department Provider Note   I have reviewed the triage vital signs and the nursing notes.   HISTORY  Chief Complaint Fall   HPI Lindsey Patel is a 84 y.o. female with PMH of HLD presents to the emergency department for evaluation after mechanical fall.  The patient was leaving a restaurant when she stepped down and missed the curb.  She fell while twisting and landing on her left side.  She denies any head injury or loss of consciousness.  No presyncope symptoms.  She was able to stand with some pain and drove herself home.  She has pain and swelling to the left elbow with some bleeding there but feels only mild discomfort with moving the elbow. No neck or lower back pain.   Past Medical History:  Diagnosis Date  . Chronic lymphocytic leukemia 09/22/2007   Qualifier: Diagnosis of  By: Jerold Coombe    . Hyperlipidemia   . Leukemia ([place])   . Osteoporosis     Patient Active Problem List   Diagnosis Date Noted  . Closed left hip fracture ([place]) 04/02/2018  . Left elbow fracture 04/02/2018  . Pelvic fracture ([place]) 04/02/2018  . Hip fracture ([place]) 04/02/2018  . Fall against object 04/04/2015  . Anxiety state, unspecified 07/24/2013  . Dyspepsia 07/24/2013  . Chronic lymphocytic leukemia ([place]) 09/22/2007  . URI 09/22/2007  . DISEASE, WHITE BLOOD CELL NEC 07/01/2007  . SINUSITIS, ACUTE NOS 06/17/2007  . Leukocytosis 05/27/2007  . Hyperlipidemia LDL goal <100 05/07/2007  . OSTEOPOROSIS 05/07/2007    Past Surgical History:  Procedure Laterality Date  . CATARACT EXTRACTION  6/03   left  . CATARACT EXTRACTION  4/09   right  . TUBAL LIGATION        Allergies Pneumococcal vaccines  Family History  Problem Relation Age of Onset  . Stroke Mother   . Cancer Father        stomach  . Parkinsonism Brother   . Cancer Brother        lung  . Heart disease Brother        MI  . Alzheimer's disease Sister   . Stroke Unknown   . Alzheimer's disease Unknown     . Dementia Unknown   . Parkinsonism Unknown     Social History Social History   Tobacco Use  . Smoking status: Former Smoker    Packs/day: 0.50    Years: 4.00    Pack years: 2.00    Types: Cigarettes    Last attempt to quit: 12/13/1963    Years since quitting: 54.3  . Smokeless tobacco: Never Used  Substance Use Topics  . Alcohol use: No  . Drug use: No    Review of Systems  Constitutional: No fever/chills Eyes: No visual changes. ENT: No sore throat. Cardiovascular: Denies chest pain. Respiratory: Denies shortness of breath. Gastrointestinal: No abdominal pain.  No nausea, no vomiting.  No diarrhea.  No constipation. Genitourinary: Negative for dysuria. Musculoskeletal: Positive groin pain and left elbow pain and swelling.  Skin: Positive left arm bleeding.  Neurological: Negative for headaches, focal weakness or numbness.  10-point ROS otherwise negative.  ____________________________________________   PHYSICAL EXAM:  VITAL SIGNS: ED Triage Vitals  Enc Vitals Group     BP 04/01/18 2101 (!) 157/74     Pulse Rate 04/01/18 2101 90     Resp 04/01/18 2102 17     Temp 04/01/18 2102 97.9 F (36.6 C)     Temp Source  04/01/18 2102 Oral     SpO2 04/01/18 2058 93 %     Pain Score 04/01/18 2102 7   Constitutional: Alert and oriented. Well appearing and in no acute distress. Eyes: Conjunctivae are normal. PERRL. Head: Atraumatic. Nose: No congestion/rhinnorhea. Mouth/Throat: Mucous membranes are moist.   Neck: No stridor. No cervical spine tenderness to palpation. Cardiovascular: Normal rate, regular rhythm. Good peripheral circulation. Grossly normal heart sounds.   Respiratory: Normal respiratory effort.  No retractions. Lungs CTAB. Gastrointestinal: Soft and nontender. No distention.  Musculoskeletal: Moderate pain with passive ROM of the left hip. Moderate swelling and bruising over the left elbow.  Neurologic:  Normal speech and language. No gross focal  neurologic deficits are appreciated.  Skin:  Skin is warm and dry. 2 cm skin tare over the proximal left forearm.   ____________________________________________  RADIOLOGY  Dg Chest 1 View  Result Date: 04/01/2018 CLINICAL DATA:  Fall at home with left hip and elbow fractures. EXAM: CHEST  1 VIEW COMPARISON:  None. FINDINGS: The cardiomediastinal contours are normal. The lungs are clear. Pulmonary vasculature is normal. No consolidation, pleural effusion, or pneumothorax. No acute osseous abnormalities are seen. The bones are under mineralized. IMPRESSION: No acute chest findings. Electronically Signed   By: Jeb Levering M.D.   On: 04/01/2018 23:39   Dg Elbow Complete Left  Result Date: 04/01/2018 CLINICAL DATA:  Left elbow pain and bleeding after fall at home. Pain about the olecranon. EXAM: LEFT ELBOW - COMPLETE 3+ VIEW COMPARISON:  None. FINDINGS: Mid olecranon fracture with distraction of approximately 16 mm at the articular surface and 13 mm of the posterior cortex. Fracture is mildly comminuted. Mild retraction of the proximal fragment. Associated joint effusion and large amount of soft tissue edema. The radius and distal humerus appear intact. IMPRESSION: Significantly displaced and mildly comminuted mid olecranon fracture with articular involvement. Electronically Signed   By: Jeb Levering M.D.   On: 04/01/2018 23:38   Dg Hip Unilat W Or Wo Pelvis 2-3 Views Left  Result Date: 04/01/2018 CLINICAL DATA:  Fall at home with left hip pain. EXAM: DG HIP (WITH OR WITHOUT PELVIS) 2-3V LEFT COMPARISON:  None. FINDINGS: Impacted fracture of the left femoral neck with mild angulation. Femoral head remains seated. Minimally displaced fracture of the left superior pubic ramus and nondisplaced inferior ramus fracture are age indeterminate. Right femoral head is seated in the acetabulum. Pubic symphysis and sacroiliac joints are congruent. The bones are under mineralized. Multiple pelvic  phleboliths. IMPRESSION: 1. Impacted fracture of the left femoral neck with angulation. 2. Left superior and inferior pubic ramus fractures are age indeterminate, but may be acute. Electronically Signed   By: Jeb Levering M.D.   On: 04/01/2018 23:35    ____________________________________________   PROCEDURES  Procedure(s) performed:   Procedures  None ____________________________________________   INITIAL IMPRESSION / ASSESSMENT AND PLAN / ED COURSE  Pertinent labs & imaging results that were available during my care of the patient were reviewed by me and considered in my medical decision making (see chart for details).  Patient presents to the emergency department for evaluation after mechanical fall.  She has mild to moderate discomfort in the left hip with range of motion.  No shortening or deformity.  There is swelling over the left elbow with small skin tear in the proximal forearm.  Remedies are neurovascularly intact. No indication for CT head or c-spine at this time.   Imaging pending. Care transferred to oncoming EDP.  ____________________________________________  FINAL CLINICAL IMPRESSION(S) / ED DIAGNOSES  Final diagnoses:  Fall, initial encounter  Closed fracture of left hip, initial encounter ([place])  Left elbow fracture, closed, initial encounter  Skin tear of elbow without complication, initial encounter     MEDICATIONS GIVEN DURING THIS VISIT:  Medications  morphine 4 MG/ML injection 0.52 mg (has no administration in time range)     Note:  This document was prepared using Dragon voice recognition software and may include unintentional dictation errors.  Nanda Quinton, MD Emergency Medicine   Long, Wonda Olds, MD 04/02/18 [phone]

## 2018-04-01 NOTE — ED Notes (Signed)
Patient transported to X-ray 

## 2018-04-01 NOTE — ED Provider Notes (Signed)
Care assumed from Nanda Quinton, MD.  Please see his full H&P.  In short,  Lindsey Patel is a 84 y.o. female Hx of CLL, high cholesterol presents for elbow and groin pain after fall.  Fall was mechanical and patient did not have a loss of consciousness or hit her head.  She has associated skin tear to the left elbow.    Physical Exam  BP (!) 150/63   Pulse 80   Temp 97.9 F (36.6 C) (Oral)   Resp 17   SpO2 91%   Physical Exam  Constitutional: She appears well-developed and well-nourished. No distress.  HENT:  Head: Normocephalic.  Eyes: Conjunctivae are normal. No scleral icterus.  Neck: Normal range of motion.  Cardiovascular: Normal rate and intact distal pulses.  Pulmonary/Chest: Effort normal.  Musculoskeletal:  Patient unable to range left hip due to severe pain. The elbow is significantly swollen with large ecchymosis and 3 cm laceration.  This does not appear to be an open joint.  Neurological: She is alert.  Skin: Skin is warm and dry.  Nursing note and vitals reviewed.   ED Course/Procedures   Clinical Course as of Apr 02 729  Tue Apr 01, 2018  2230 Plan: Left hip and elbow films are pending.  If they are negative and patient is able to ambulate we will discharge home.   [HM]  Wed Apr 02, 2018  0007 Impacted fracture of the left femoral neck with angulation in addition to left superior and inferior pubic rami fractures.  Have personally evaluated the films and discussed them with the patient.  DG Hip Unilat W or Wo Pelvis 2-3 Views Left [HM]  0008 Significantly displaced and mildly comminuted mid olecranon fracture with articular involvement.  I personally evaluated these films and discussed them with the patient.  DG Elbow Complete Left [HM]  0008 No acute cardiopulmonary process.  I personally reviewed these films.  DG Chest 1 View [HM]  0028 Discussed with Dr. Mardelle Matte.  He requests repair of the skin tear of the left elbow and long-arm splint.  Patient is to be  n.p.o. with plan for surgery later today.  Medicine admit.   [HM]  0203 Discussed with Dr. Hal Hope who will admit.   [HM]    Clinical Course User Index [HM] Edris Friedt, Gwenlyn Perking     Results for orders placed or performed in visit on 02/20/18  Comprehensive metabolic panel  Result Value Ref Range   Sodium 139 135 - 145 mEq/L   Potassium 3.8 3.5 - 5.1 mEq/L   Chloride 104 96 - 112 mEq/L   CO2 26 19 - 32 mEq/L   Glucose, Bld 111 (H) 70 - 99 mg/dL   BUN 8 6 - 23 mg/dL   Creatinine, Ser 0.54 0.40 - 1.20 mg/dL   Total Bilirubin 0.3 0.2 - 1.2 mg/dL   Alkaline Phosphatase 60 39 - 117 U/L   AST 23 0 - 37 U/L   ALT 14 0 - 35 U/L   Total Protein 6.2 6.0 - 8.3 g/dL   Albumin 4.1 3.5 - 5.2 g/dL   Calcium 9.2 8.4 - 10.5 mg/dL   GFR 113.05 >60.00 mL/min  Lipid panel  Result Value Ref Range   Cholesterol 141 0 - 200 mg/dL   Triglycerides 95.0 0.0 - 149.0 mg/dL   HDL 56.70 >39.00 mg/dL   VLDL 19.0 0.0 - 40.0 mg/dL   LDL Cholesterol 66 0 - 99 mg/dL   Total CHOL/HDL Ratio 2  NonHDL 84.74    Dg Chest 1 View  Result Date: 04/01/2018 CLINICAL DATA:  Fall at home with left hip and elbow fractures. EXAM: CHEST  1 VIEW COMPARISON:  None. FINDINGS: The cardiomediastinal contours are normal. The lungs are clear. Pulmonary vasculature is normal. No consolidation, pleural effusion, or pneumothorax. No acute osseous abnormalities are seen. The bones are under mineralized. IMPRESSION: No acute chest findings. Electronically Signed   By: Jeb Levering M.D.   On: 04/01/2018 23:39   Dg Elbow Complete Left  Result Date: 04/01/2018 CLINICAL DATA:  Left elbow pain and bleeding after fall at home. Pain about the olecranon. EXAM: LEFT ELBOW - COMPLETE 3+ VIEW COMPARISON:  None. FINDINGS: Mid olecranon fracture with distraction of approximately 16 mm at the articular surface and 13 mm of the posterior cortex. Fracture is mildly comminuted. Mild retraction of the proximal fragment. Associated joint  effusion and large amount of soft tissue edema. The radius and distal humerus appear intact. IMPRESSION: Significantly displaced and mildly comminuted mid olecranon fracture with articular involvement. Electronically Signed   By: Jeb Levering M.D.   On: 04/01/2018 23:38   Dg Hip Unilat W Or Wo Pelvis 2-3 Views Left  Result Date: 04/01/2018 CLINICAL DATA:  Fall at home with left hip pain. EXAM: DG HIP (WITH OR WITHOUT PELVIS) 2-3V LEFT COMPARISON:  None. FINDINGS: Impacted fracture of the left femoral neck with mild angulation. Femoral head remains seated. Minimally displaced fracture of the left superior pubic ramus and nondisplaced inferior ramus fracture are age indeterminate. Right femoral head is seated in the acetabulum. Pubic symphysis and sacroiliac joints are congruent. The bones are under mineralized. Multiple pelvic phleboliths. IMPRESSION: 1. Impacted fracture of the left femoral neck with angulation. 2. Left superior and inferior pubic ramus fractures are age indeterminate, but may be acute. Electronically Signed   By: Jeb Levering M.D.   On: 04/01/2018 23:35     .Marland KitchenLaceration Repair Date/Time: 04/02/2018 1:30 AM Performed by: Abigail Butts, PA-C Authorized by: Abigail Butts, PA-C   Consent:    Consent obtained:  Verbal   Consent given by:  Patient   Risks discussed:  Infection, pain, poor cosmetic result and poor wound healing   Alternatives discussed:  No treatment Anesthesia (see MAR for exact dosages):    Anesthesia method:  Local infiltration   Local anesthetic:  Lidocaine 2% WITH epi (73mL) Laceration details:    Location:  Shoulder/arm   Shoulder/arm location:  L elbow   Length (cm):  3 Repair type:    Repair type:  Simple Pre-procedure details:    Preparation:  Patient was prepped and draped in usual sterile fashion and imaging obtained to evaluate for foreign bodies Exploration:    Hemostasis achieved with:  Epinephrine and direct pressure    Wound exploration: entire depth of wound probed and visualized   Treatment:    Area cleansed with:  Saline   Amount of cleaning:  Standard   Irrigation solution:  Sterile water   Irrigation method:  Syringe Skin repair:    Repair method:  Sutures   Suture size:  5-0   Wound skin closure material used: Vicryl.   Suture technique:  Simple interrupted   Number of sutures:  4 Approximation:    Approximation:  Close Post-procedure details:    Dressing:  Non-adherent dressing and splint for protection   Patient tolerance of procedure:  Tolerated well, no immediate complications .Splint Application Date/Time: 05/03/4579 7:34 AM Performed by: Abigail Butts, PA-C Authorized by:  Ebonique Hallstrom, Jarrett Soho, PA-C   Consent:    Consent obtained:  Verbal   Consent given by:  Patient   Risks discussed:  Discoloration, pain, swelling and numbness   Alternatives discussed:  No treatment Pre-procedure details:    Sensation:  Normal   Skin color:  Flesh Procedure details:    Laterality:  Left   Location:  Arm   Arm:  L lower arm and L upper arm   Splint type:  Long arm Post-procedure details:    Pain:  Unchanged   Sensation:  Normal   Skin color:  Flesh   Patient tolerance of procedure:  Tolerated well, no immediate complications    MDM    Patient with mechanical fall.  Small laceration to the left elbow was repaired without difficulty.  Films of the left hip and left elbow show significant fractures of both.  I personally evaluated these images and reviewed them with the patient and her family.  Patient will need surgical repair of her hip and potential surgical repair of her elbow as well.  Discussed with Dr. Mardelle Matte who will handle orthopedic procedures.  Patient will be admitted to the hospitalist.    Fall, initial encounter  Pain - Plan: DG Chest 1 View, DG Chest 1 View  Closed fracture of left hip, initial encounter ([place])  Left elbow fracture, closed, initial  encounter  Skin tear of elbow without complication, initial encounter     Agapito Games 04/02/18 [place]    Daleen Bo, MD 04/02/18 1006

## 2018-04-01 NOTE — ED Notes (Signed)
Called X-ray to check time frame of transport

## 2018-04-01 NOTE — ED Notes (Addendum)
Patient in bathroom

## 2018-04-02 ENCOUNTER — Encounter (HOSPITAL_COMMUNITY)
Admission: EM | Disposition: A | Discharge status: Skilled Nursing Facility | Payer: Self-pay | Source: Home / Self Care | Attending: Internal Medicine

## 2018-04-02 ENCOUNTER — Inpatient Hospital Stay (HOSPITAL_COMMUNITY): Payer: Medicare Other

## 2018-04-02 ENCOUNTER — Inpatient Hospital Stay (HOSPITAL_COMMUNITY): Payer: Medicare Other | Admitting: Anesthesiology

## 2018-04-02 ENCOUNTER — Other Ambulatory Visit: Payer: Self-pay

## 2018-04-02 ENCOUNTER — Encounter (HOSPITAL_COMMUNITY): Payer: Self-pay | Admitting: Internal Medicine

## 2018-04-02 DIAGNOSIS — D62 Acute posthemorrhagic anemia: Secondary | ICD-10-CM | POA: Diagnosis not present

## 2018-04-02 DIAGNOSIS — Z800 Family history of malignant neoplasm of digestive organs: Secondary | ICD-10-CM | POA: Diagnosis not present

## 2018-04-02 DIAGNOSIS — Z820 Family history of epilepsy and other diseases of the nervous system: Secondary | ICD-10-CM | POA: Diagnosis not present

## 2018-04-02 DIAGNOSIS — Y92480 Sidewalk as the place of occurrence of the external cause: Secondary | ICD-10-CM | POA: Diagnosis not present

## 2018-04-02 DIAGNOSIS — S72002A Fracture of unspecified part of neck of left femur, initial encounter for closed fracture: Secondary | ICD-10-CM | POA: Diagnosis present

## 2018-04-02 DIAGNOSIS — Z7982 Long term (current) use of aspirin: Secondary | ICD-10-CM | POA: Diagnosis not present

## 2018-04-02 DIAGNOSIS — C9110 Chronic lymphocytic leukemia of B-cell type not having achieved remission: Secondary | ICD-10-CM | POA: Diagnosis present

## 2018-04-02 DIAGNOSIS — S51012A Laceration without foreign body of left elbow, initial encounter: Secondary | ICD-10-CM | POA: Diagnosis present

## 2018-04-02 DIAGNOSIS — S52022A Displaced fracture of olecranon process without intraarticular extension of left ulna, initial encounter for closed fracture: Secondary | ICD-10-CM

## 2018-04-02 DIAGNOSIS — R52 Pain, unspecified: Secondary | ICD-10-CM | POA: Diagnosis not present

## 2018-04-02 DIAGNOSIS — S329XXA Fracture of unspecified parts of lumbosacral spine and pelvis, initial encounter for closed fracture: Secondary | ICD-10-CM

## 2018-04-02 DIAGNOSIS — Z823 Family history of stroke: Secondary | ICD-10-CM | POA: Diagnosis not present

## 2018-04-02 DIAGNOSIS — Z801 Family history of malignant neoplasm of trachea, bronchus and lung: Secondary | ICD-10-CM | POA: Diagnosis not present

## 2018-04-02 DIAGNOSIS — S72002D Fracture of unspecified part of neck of left femur, subsequent encounter for closed fracture with routine healing: Secondary | ICD-10-CM | POA: Diagnosis not present

## 2018-04-02 DIAGNOSIS — D696 Thrombocytopenia, unspecified: Secondary | ICD-10-CM | POA: Diagnosis not present

## 2018-04-02 DIAGNOSIS — C9190 Lymphoid leukemia, unspecified not having achieved remission: Secondary | ICD-10-CM | POA: Diagnosis not present

## 2018-04-02 DIAGNOSIS — R739 Hyperglycemia, unspecified: Secondary | ICD-10-CM | POA: Diagnosis present

## 2018-04-02 DIAGNOSIS — M810 Age-related osteoporosis without current pathological fracture: Secondary | ICD-10-CM | POA: Diagnosis present

## 2018-04-02 DIAGNOSIS — W101XXA Fall (on)(from) sidewalk curb, initial encounter: Secondary | ICD-10-CM | POA: Diagnosis present

## 2018-04-02 DIAGNOSIS — Z8249 Family history of ischemic heart disease and other diseases of the circulatory system: Secondary | ICD-10-CM | POA: Diagnosis not present

## 2018-04-02 DIAGNOSIS — Z87891 Personal history of nicotine dependence: Secondary | ICD-10-CM | POA: Diagnosis not present

## 2018-04-02 DIAGNOSIS — S32512A Fracture of superior rim of left pubis, initial encounter for closed fracture: Secondary | ICD-10-CM | POA: Diagnosis present

## 2018-04-02 DIAGNOSIS — S52032A Displaced fracture of olecranon process with intraarticular extension of left ulna, initial encounter for closed fracture: Secondary | ICD-10-CM | POA: Diagnosis present

## 2018-04-02 DIAGNOSIS — E785 Hyperlipidemia, unspecified: Secondary | ICD-10-CM | POA: Diagnosis present

## 2018-04-02 HISTORY — DX: Fracture of unspecified part of neck of left femur, initial encounter for closed fracture: S72.002A

## 2018-04-02 HISTORY — PX: ORIF ELBOW FRACTURE: SHX5031

## 2018-04-02 HISTORY — DX: Displaced fracture of olecranon process without intraarticular extension of left ulna, initial encounter for closed fracture: S52.022A

## 2018-04-02 HISTORY — PX: ORIF HIP FRACTURE: SHX2125

## 2018-04-02 HISTORY — DX: Fracture of unspecified parts of lumbosacral spine and pelvis, initial encounter for closed fracture: S32.9XXA

## 2018-04-02 LAB — BASIC METABOLIC PANEL
ANION GAP: 9 (ref 5–15)
Anion gap: 11 (ref 5–15)
BUN: 12 mg/dL (ref 6–20)
BUN: 13 mg/dL (ref 6–20)
CHLORIDE: 105 mmol/L (ref 101–111)
CHLORIDE: 106 mmol/L (ref 101–111)
CO2: 22 mmol/L (ref 22–32)
CO2: 23 mmol/L (ref 22–32)
CREATININE: 0.5 mg/dL (ref 0.44–1.00)
CREATININE: 0.58 mg/dL (ref 0.44–1.00)
Calcium: 9.1 mg/dL (ref 8.9–10.3)
Calcium: 8.9 mg/dL (ref 8.9–10.3)
GFR calc non Af Amer: >60 mL/min (ref >60)
Glucose, Bld: 163 mg/dL — ABNORMAL HIGH (ref 65–99)
Glucose, Bld: 162 mg/dL — ABNORMAL HIGH (ref 65–99)
POTASSIUM: 4.4 mmol/L (ref 3.5–5.1)
Potassium: 4.3 mmol/L (ref 3.5–5.1)
SODIUM: 139 mmol/L (ref 135–145)
Sodium: 137 mmol/L (ref 135–145)

## 2018-04-02 LAB — CBC
HCT: 37.5 % (ref 36.0–46.0)
HEMATOCRIT: 36.9 % (ref 36.0–46.0)
HEMOGLOBIN: 12.3 g/dL (ref 12.0–15.0)
HEMOGLOBIN: 12.8 g/dL (ref 12.0–15.0)
MCH: 31.1 pg (ref 26.0–34.0)
MCH: 31.4 pg (ref 26.0–34.0)
MCHC: 34.1 g/dL (ref 30.0–36.0)
MCHC: 33.3 g/dL (ref 30.0–36.0)
MCV: 91.9 fL (ref 78.0–100.0)
MCV: 93.4 fL (ref 78.0–100.0)
PLATELETS: 162 10*3/uL (ref 150–400)
Platelets: 156 10*3/uL (ref 150–400)
RBC: 3.95 MIL/uL (ref 3.87–5.11)
RBC: 4.08 MIL/uL (ref 3.87–5.11)
RDW: 12.5 % (ref 11.5–15.5)
RDW: 12.7 % (ref 11.5–15.5)
WBC: 26.1 10*3/uL — ABNORMAL HIGH (ref 4.0–10.5)
WBC: 24.4 10*3/uL — AB (ref 4.0–10.5)

## 2018-04-02 LAB — ABO/RH: ABO/RH(D): O POS

## 2018-04-02 LAB — TYPE AND SCREEN
ABO/RH(D): O POS
ANTIBODY SCREEN: NEGATIVE

## 2018-04-02 LAB — MRSA PCR SCREENING: MRSA BY PCR: NEGATIVE

## 2018-04-02 SURGERY — OPEN REDUCTION INTERNAL FIXATION (ORIF) ELBOW/OLECRANON FRACTURE
Anesthesia: General | Site: Hip | Laterality: Left

## 2018-04-02 MED ORDER — FENTANYL CITRATE (PF) 250 MCG/5ML IJ SOLN
INTRAMUSCULAR | Status: AC
  Filled 2018-04-02: qty 5

## 2018-04-02 MED ORDER — LIDOCAINE HCL (CARDIAC) PF 100 MG/5ML IV SOSY
PREFILLED_SYRINGE | INTRAVENOUS | Status: DC | PRN
  Administered 2018-04-02: 70 mg via INTRAVENOUS

## 2018-04-02 MED ORDER — LACTATED RINGERS IV SOLN
INTRAVENOUS | Status: DC
  Administered 2018-04-02 (×2): via INTRAVENOUS

## 2018-04-02 MED ORDER — DEXAMETHASONE SODIUM PHOSPHATE 10 MG/ML IJ SOLN
INTRAMUSCULAR | Status: AC
  Filled 2018-04-02: qty 1

## 2018-04-02 MED ORDER — SUGAMMADEX SODIUM 200 MG/2ML IV SOLN
INTRAVENOUS | Status: DC | PRN
  Administered 2018-04-02: 200 mg via INTRAVENOUS

## 2018-04-02 MED ORDER — PROPOFOL 10 MG/ML IV BOLUS
INTRAVENOUS | Status: AC
  Filled 2018-04-02: qty 20

## 2018-04-02 MED ORDER — FENTANYL CITRATE (PF) 100 MCG/2ML IJ SOLN
INTRAMUSCULAR | Status: AC
  Filled 2018-04-02: qty 2

## 2018-04-02 MED ORDER — CEFAZOLIN SODIUM-DEXTROSE 2-4 GM/100ML-% IV SOLN
2.0000 g | INTRAVENOUS | Status: AC
  Administered 2018-04-02: 2 g via INTRAVENOUS
  Filled 2018-04-02: qty 100

## 2018-04-02 MED ORDER — VANCOMYCIN HCL 500 MG IV SOLR
INTRAVENOUS | Status: AC
  Filled 2018-04-02: qty 500

## 2018-04-02 MED ORDER — CHLORHEXIDINE GLUCONATE 4 % EX LIQD: 60.0000 mL | Freq: Once | CUTANEOUS | Status: DC

## 2018-04-02 MED ORDER — POVIDONE-IODINE 10 % EX SWAB: 2.0000 "application " | Freq: Once | CUTANEOUS | Status: DC

## 2018-04-02 MED ORDER — SUGAMMADEX SODIUM 200 MG/2ML IV SOLN
INTRAVENOUS | Status: AC
  Filled 2018-04-02: qty 2

## 2018-04-02 MED ORDER — BUPIVACAINE-EPINEPHRINE (PF) 0.25% -1:200000 IJ SOLN
INTRAMUSCULAR | Status: DC | PRN
  Administered 2018-04-02: 25 mL via PERINEURAL

## 2018-04-02 MED ORDER — FENTANYL CITRATE (PF) 100 MCG/2ML IJ SOLN
25.0000 ug | Freq: Once | INTRAMUSCULAR | Status: AC
  Administered 2018-04-02: 25 ug via INTRAVENOUS

## 2018-04-02 MED ORDER — BACITRACIN ZINC 500 UNIT/GM EX OINT
TOPICAL_OINTMENT | CUTANEOUS | Status: AC
  Filled 2018-04-02: qty 28.35

## 2018-04-02 MED ORDER — CEFAZOLIN SODIUM-DEXTROSE 2-4 GM/100ML-% IV SOLN
2.0000 g | Freq: Three times a day (TID) | INTRAVENOUS | Status: AC
  Administered 2018-04-02 – 2018-04-03 (×2): 2 g via INTRAVENOUS
  Filled 2018-04-02 (×3): qty 100

## 2018-04-02 MED ORDER — DEXAMETHASONE SODIUM PHOSPHATE 10 MG/ML IJ SOLN
INTRAMUSCULAR | Status: DC | PRN
  Administered 2018-04-02: 5 mg via INTRAVENOUS

## 2018-04-02 MED ORDER — FENTANYL CITRATE (PF) 100 MCG/2ML IJ SOLN: 50.0000 ug | Freq: Once | INTRAMUSCULAR | Status: DC

## 2018-04-02 MED ORDER — PROPOFOL 10 MG/ML IV BOLUS
INTRAVENOUS | Status: DC | PRN
  Administered 2018-04-02: 80 mg via INTRAVENOUS

## 2018-04-02 MED ORDER — FENTANYL CITRATE (PF) 100 MCG/2ML IJ SOLN
50.0000 ug | Freq: Once | INTRAMUSCULAR | Status: AC
  Administered 2018-04-02: 50 ug via INTRAVENOUS

## 2018-04-02 MED ORDER — FENTANYL CITRATE (PF) 100 MCG/2ML IJ SOLN
INTRAMUSCULAR | Status: DC | PRN
  Administered 2018-04-02: 50 ug via INTRAVENOUS
  Administered 2018-04-02: 25 ug via INTRAVENOUS
  Administered 2018-04-02: 50 ug via INTRAVENOUS

## 2018-04-02 MED ORDER — HYDROCODONE-ACETAMINOPHEN 5-325 MG PO TABS: 1.0000 | ORAL_TABLET | ORAL | Status: DC | PRN

## 2018-04-02 MED ORDER — SODIUM CHLORIDE 0.45 % IV SOLN
INTRAVENOUS | Status: DC
  Administered 2018-04-02: 19:00:00 via INTRAVENOUS

## 2018-04-02 MED ORDER — ONDANSETRON HCL 4 MG/2ML IJ SOLN
INTRAMUSCULAR | Status: DC | PRN
  Administered 2018-04-02: 4 mg via INTRAVENOUS

## 2018-04-02 MED ORDER — VANCOMYCIN HCL 1000 MG IV SOLR
INTRAVENOUS | Status: AC
  Filled 2018-04-02: qty 1000

## 2018-04-02 MED ORDER — 0.9 % SODIUM CHLORIDE (POUR BTL) OPTIME
TOPICAL | Status: DC | PRN
  Administered 2018-04-02: 1000 mL

## 2018-04-02 MED ORDER — LIDOCAINE 2% (20 MG/ML) 5 ML SYRINGE
INTRAMUSCULAR | Status: AC
Start: 2018-04-02 — End: ?
  Filled 2018-04-02: qty 5

## 2018-04-02 MED ORDER — PHENYLEPHRINE HCL 10 MG/ML IJ SOLN
INTRAVENOUS | Status: DC | PRN
  Administered 2018-04-02: 25 ug/min via INTRAVENOUS

## 2018-04-02 MED ORDER — ROCURONIUM BROMIDE 10 MG/ML (PF) SYRINGE
PREFILLED_SYRINGE | INTRAVENOUS | Status: AC
  Filled 2018-04-02: qty 5

## 2018-04-02 MED ORDER — BACITRACIN ZINC 500 UNIT/GM EX OINT
TOPICAL_OINTMENT | CUTANEOUS | Status: DC | PRN
  Administered 2018-04-02: 1 via TOPICAL

## 2018-04-02 MED ORDER — TOBRAMYCIN SULFATE 1.2 G IJ SOLR
INTRAMUSCULAR | Status: AC
  Filled 2018-04-02: qty 1.2

## 2018-04-02 MED ORDER — CEFAZOLIN SODIUM-DEXTROSE 2-4 GM/100ML-% IV SOLN: 2.0000 g | INTRAVENOUS | Status: DC

## 2018-04-02 MED ORDER — FENTANYL CITRATE (PF) 100 MCG/2ML IJ SOLN
INTRAMUSCULAR | Status: AC
  Administered 2018-04-02: 50 ug via INTRAVENOUS
  Filled 2018-04-02: qty 2

## 2018-04-02 MED ORDER — MORPHINE SULFATE (PF) 4 MG/ML IV SOLN: 0.5000 mg | INTRAVENOUS | Status: DC | PRN

## 2018-04-02 MED ORDER — ROCURONIUM BROMIDE 10 MG/ML (PF) SYRINGE
PREFILLED_SYRINGE | INTRAVENOUS | Status: DC | PRN
  Administered 2018-04-02: 40 mg via INTRAVENOUS

## 2018-04-02 SURGICAL SUPPLY — 79 items
BANDAGE ACE 3X5.8 VEL STRL LF (GAUZE/BANDAGES/DRESSINGS) ×4 IMPLANT
BANDAGE ACE 4X5 VEL STRL LF (GAUZE/BANDAGES/DRESSINGS) ×4 IMPLANT
BANDAGE ACE 6X5 VEL STRL LF (GAUZE/BANDAGES/DRESSINGS) ×4 IMPLANT
BENZOIN TINCTURE PRP APPL 2/3 (GAUZE/BANDAGES/DRESSINGS) IMPLANT
BIT DRILL CANN LRG QC 5X300 (BIT) ×4 IMPLANT
BLADE CLIPPER SURG (BLADE) ×4 IMPLANT
BLADE SURG 10 STRL SS (BLADE) IMPLANT
BNDG COHESIVE 4X5 TAN STRL (GAUZE/BANDAGES/DRESSINGS) ×8 IMPLANT
BNDG ESMARK 4X9 LF (GAUZE/BANDAGES/DRESSINGS) ×4 IMPLANT
BNDG GAUZE ELAST 4 BULKY (GAUZE/BANDAGES/DRESSINGS) ×4 IMPLANT
BRUSH SCRUB SURG 4.25 DISP (MISCELLANEOUS) ×8 IMPLANT
CHLORAPREP W/TINT 26ML (MISCELLANEOUS) ×4 IMPLANT
CLEANER TIP ELECTROSURG 2X2 (MISCELLANEOUS) ×4 IMPLANT
CLOSURE WOUND 1/2 X4 (GAUZE/BANDAGES/DRESSINGS)
COVER SURGICAL LIGHT HANDLE (MISCELLANEOUS) ×8 IMPLANT
CUFF TOURNIQUET SINGLE 18IN (TOURNIQUET CUFF) IMPLANT
CUFF TOURNIQUET SINGLE 24IN (TOURNIQUET CUFF) IMPLANT
DECANTER SPIKE VIAL GLASS SM (MISCELLANEOUS) IMPLANT
DERMABOND ADVANCED (GAUZE/BANDAGES/DRESSINGS) ×2
DERMABOND ADVANCED .7 DNX12 (GAUZE/BANDAGES/DRESSINGS) ×2 IMPLANT
DRAPE C-ARM 42X72 X-RAY (DRAPES) ×4 IMPLANT
DRAPE C-ARMOR (DRAPES) ×4 IMPLANT
DRAPE INCISE IOBAN 66X45 STRL (DRAPES) IMPLANT
DRAPE ORTHO SPLIT 77X108 STRL (DRAPES) ×2
DRAPE SURG ORHT 6 SPLT 77X108 (DRAPES) ×2 IMPLANT
DRAPE U-SHAPE 47X51 STRL (DRAPES) ×4 IMPLANT
DRSG ADAPTIC 3X8 NADH LF (GAUZE/BANDAGES/DRESSINGS) ×4 IMPLANT
DRSG EMULSION OIL 3X3 NADH (GAUZE/BANDAGES/DRESSINGS) IMPLANT
DRSG MEPILEX BORDER 4X4 (GAUZE/BANDAGES/DRESSINGS) ×4 IMPLANT
ELECT REM PT RETURN 9FT ADLT (ELECTROSURGICAL) ×4
ELECTRODE REM PT RTRN 9FT ADLT (ELECTROSURGICAL) ×2 IMPLANT
FACESHIELD WRAPAROUND (MASK) IMPLANT
GAUZE SPONGE 4X4 12PLY STRL (GAUZE/BANDAGES/DRESSINGS) ×4 IMPLANT
GAUZE XEROFORM 1X8 LF (GAUZE/BANDAGES/DRESSINGS) ×4 IMPLANT
GAUZE XEROFORM 5X9 LF (GAUZE/BANDAGES/DRESSINGS) ×4 IMPLANT
GLOVE BIO SURGEON STRL SZ7 (GLOVE) ×4 IMPLANT
GLOVE BIO SURGEON STRL SZ7.5 (GLOVE) ×16 IMPLANT
GLOVE BIOGEL PI IND STRL 7.5 (GLOVE) ×4 IMPLANT
GLOVE BIOGEL PI INDICATOR 7.5 (GLOVE) ×4
GOWN STRL REUS W/ TWL LRG LVL3 (GOWN DISPOSABLE) ×4 IMPLANT
GOWN STRL REUS W/TWL LRG LVL3 (GOWN DISPOSABLE) ×4
GUIDEWIRE THREADED 2.8 (WIRE) ×12 IMPLANT
KIT BASIN OR (CUSTOM PROCEDURE TRAY) ×4 IMPLANT
KIT TURNOVER KIT B (KITS) ×4 IMPLANT
MANIFOLD NEPTUNE II (INSTRUMENTS) ×4 IMPLANT
NS IRRIG 1000ML POUR BTL (IV SOLUTION) ×4 IMPLANT
PACK ORTHO EXTREMITY (CUSTOM PROCEDURE TRAY) ×4 IMPLANT
PAD ARMBOARD 7.5X6 YLW CONV (MISCELLANEOUS) ×8 IMPLANT
PAD CAST 4YDX4 CTTN HI CHSV (CAST SUPPLIES) ×2 IMPLANT
PADDING CAST COTTON 4X4 STRL (CAST SUPPLIES) ×2
PADDING CAST COTTON 6X4 STRL (CAST SUPPLIES) ×4 IMPLANT
RETRIEVER SUT HEWSON (MISCELLANEOUS) ×4 IMPLANT
SCREW 6.5MM CANN 32X80 (Screw) ×4 IMPLANT
SCREW 6.5MM CANN 32X85 (Screw) ×4 IMPLANT
SCREW CANN 32MM 6.5X75MM (Screw) ×4 IMPLANT
SCREW CANN 6.5X80 HEXAGONAL (Screw) ×4 IMPLANT
SPONGE LAP 18X18 X RAY DECT (DISPOSABLE) ×8 IMPLANT
STAPLER VISISTAT 35W (STAPLE) ×4 IMPLANT
STRIP CLOSURE SKIN 1/2X4 (GAUZE/BANDAGES/DRESSINGS) IMPLANT
SUCTION FRAZIER HANDLE 10FR (MISCELLANEOUS) ×2
SUCTION TUBE FRAZIER 10FR DISP (MISCELLANEOUS) ×2 IMPLANT
SUT ETHILON 3 0 PS 1 (SUTURE) ×8 IMPLANT
SUT MNCRL AB 3-0 PS2 18 (SUTURE) ×4 IMPLANT
SUT VIC AB 0 CT1 27 (SUTURE) ×4
SUT VIC AB 0 CT1 27XBRD ANBCTR (SUTURE) ×4 IMPLANT
SUT VIC AB 2-0 CT1 27 (SUTURE) ×6
SUT VIC AB 2-0 CT1 TAPERPNT 27 (SUTURE) ×6 IMPLANT
SYR BULB IRRIGATION 50ML (SYRINGE) ×4 IMPLANT
SYR CONTROL 10ML LL (SYRINGE) ×4 IMPLANT
TOWEL OR 17X24 6PK STRL BLUE (TOWEL DISPOSABLE) IMPLANT
TOWEL OR 17X26 10 PK STRL BLUE (TOWEL DISPOSABLE) ×8 IMPLANT
TUBE CONNECTING 12'X1/4 (SUCTIONS) ×1
TUBE CONNECTING 12X1/4 (SUCTIONS) ×3 IMPLANT
TUBE CONNECTING 20'X1/4 (TUBING) ×1
TUBE CONNECTING 20X1/4 (TUBING) ×3 IMPLANT
UNDERPAD 30X30 (UNDERPADS AND DIAPERS) ×4 IMPLANT
WASHER FOR 5.0 SCREWS (Washer) ×8 IMPLANT
WATER STERILE IRR 1000ML POUR (IV SOLUTION) ×4 IMPLANT
YANKAUER SUCT BULB TIP NO VENT (SUCTIONS) ×4 IMPLANT

## 2018-04-02 NOTE — Progress Notes (Signed)
Orthopedic Tech Progress Note Patient Details:  Lindsey Patel [birth year] [id number]  Ortho Devices Type of Ortho Device: Post (long arm) splint, Arm sling Ortho Device/Splint Location: lue Ortho Device/Splint Interventions: Ordered, Application, Adjustment   Post Interventions Patient Tolerated: Well Instructions Provided: Care of device, Adjustment of device   Karolee Stamps 04/02/2018, 2:35 AM

## 2018-04-02 NOTE — ED Notes (Signed)
Called [facility] to inquire as to why pt has still not been moved. Secretary sts she will Sports coach.

## 2018-04-02 NOTE — ED Notes (Signed)
ED TO INPATIENT HANDOFF REPORT  Name/Age/Gender Lindsey Patel 84 y.o. female  Code Status    Code Status Orders  (From admission, onward)        Start     Ordered   04/02/18 0258  Full code  Continuous     04/02/18 0259    Code Status History    This patient has a current code status but no historical code status.      Home/SNF/Other Home  Chief Complaint FAll;Groin pain   Level of Care/Admitting Diagnosis ED Disposition    ED Disposition Condition [facility] Area: [place] [100100]  Level of Care: Med-Surg [16]  Diagnosis: Hip fracture [facility]) [[id number]]  Admitting Physician: Rise Patience [phone]  Attending Physician: Rise Patience [phone]  Estimated length of stay: past midnight tomorrow  Certification:: I certify this patient will need inpatient services for at least 2 midnights  PT Class (Do Not Modify): Inpatient [101]  PT Acc Code (Do Not Modify): Private [1]       Medical History Past Medical History:  Diagnosis Date  . Chronic lymphocytic leukemia 09/22/2007   Qualifier: Diagnosis of  By: Jerold Coombe    . Hyperlipidemia   . Leukemia ([place])   . Osteoporosis     Allergies Allergies  Allergen Reactions  . Pneumococcal Vaccines Other (See Comments)    Had a local skin reaction    IV Location/Drains/Wounds Patient Lines/Drains/Airways Status   Active Line/Drains/Airways    Name:   Placement date:   Placement time:   Site:   Days:   Peripheral IV 04/02/18 Right Forearm   04/02/18    0051    Forearm   less than 1          Labs/Imaging Results for orders placed or performed during the hospital encounter of 04/01/18 (from the past 48 hour(s))  CBC     Status: Abnormal   Collection Time: 04/02/18 12:51 AM  Result Value Ref Range   WBC 26.1 (H) 4.0 - 10.5 K/uL   RBC 4.08 3.87 - 5.11 MIL/uL   Hemoglobin 12.8 12.0 - 15.0 g/dL   HCT 37.5 36.0 - 46.0 %   MCV 91.9 78.0 - 100.0 fL   MCH 31.4 26.0  - 34.0 pg   MCHC 34.1 30.0 - 36.0 g/dL   RDW 12.5 11.5 - 15.5 %   Platelets 162 150 - 400 K/uL    Comment: Performed at [facility], [place] [address], [place], [place] [address]  Basic metabolic panel     Status: Abnormal   Collection Time: 04/02/18 12:51 AM  Result Value Ref Range   Sodium 139 135 - 145 mmol/L   Potassium 4.4 3.5 - 5.1 mmol/L   Chloride 106 101 - 111 mmol/L   CO2 22 22 - 32 mmol/L   Glucose, Bld 163 (H) 65 - 99 mg/dL   BUN 13 6 - 20 mg/dL   Creatinine, Ser 0.50 0.44 - 1.00 mg/dL   Calcium 9.1 8.9 - 10.3 mg/dL   GFR calc non Af Amer >60 >60 mL/min   GFR calc Af Amer >60 >60 mL/min    Comment: (NOTE) The eGFR has been calculated using the CKD EPI equation. This calculation has not been validated in all clinical situations. eGFR's persistently <60 mL/min signify possible Chronic Kidney Disease.    Anion gap 11 5 - 15    Comment: Performed at [facility], [address] [place], [place] [address]  CBC     Status: Abnormal   Collection Time: 04/02/18  3:57 AM  Result Value Ref Range   WBC 24.4 (H) 4.0 - 10.5 K/uL   RBC 3.95 3.87 - 5.11 MIL/uL   Hemoglobin 12.3 12.0 - 15.0 g/dL   HCT 36.9 36.0 - 46.0 %   MCV 93.4 78.0 - 100.0 fL   MCH 31.1 26.0 - 34.0 pg   MCHC 33.3 30.0 - 36.0 g/dL   RDW 12.7 11.5 - 15.5 %   Platelets 156 150 - 400 K/uL    Comment: Performed at [facility], [place] [address] [place], [place] [address]  Basic metabolic panel     Status: Abnormal   Collection Time: 04/02/18  3:57 AM  Result Value Ref Range   Sodium 137 135 - 145 mmol/L   Potassium 4.3 3.5 - 5.1 mmol/L   Chloride 105 101 - 111 mmol/L   CO2 23 22 - 32 mmol/L   Glucose, Bld 162 (H) 65 - 99 mg/dL   BUN 12 6 - 20 mg/dL   Creatinine, Ser 0.58 0.44 - 1.00 mg/dL   Calcium 8.9 8.9 - 10.3 mg/dL   GFR calc non Af Amer >60 >60 mL/min   GFR calc Af Amer >60 >60 mL/min    Comment: (NOTE) The eGFR has been calculated using the CKD EPI equation. This calculation has  not been validated in all clinical situations. eGFR's persistently <60 mL/min signify possible Chronic Kidney Disease.    Anion gap 9 5 - 15    Comment: Performed at [facility] [address], [place], [place] [address]  Type and screen [place]     Status: None (Preliminary result)   Collection Time: 04/02/18  4:05 AM  Result Value Ref Range   ABO/RH(D) O POS    Antibody Screen PENDING    Sample Expiration      04/05/2018 Performed at [facility], [place] [address] [place], [place] [address]    Dg Chest 1 View  Result Date: 04/01/2018 CLINICAL DATA:  Fall at home with left hip and elbow fractures. EXAM: CHEST  1 VIEW COMPARISON:  None. FINDINGS: The cardiomediastinal contours are normal. The lungs are clear. Pulmonary vasculature is normal. No consolidation, pleural effusion, or pneumothorax. No acute osseous abnormalities are seen. The bones are under mineralized. IMPRESSION: No acute chest findings. Electronically Signed   By: Jeb Levering M.D.   On: 04/01/2018 23:39   Dg Elbow Complete Left  Result Date: 04/01/2018 CLINICAL DATA:  Left elbow pain and bleeding after fall at home. Pain about the olecranon. EXAM: LEFT ELBOW - COMPLETE 3+ VIEW COMPARISON:  None. FINDINGS: Mid olecranon fracture with distraction of approximately 16 mm at the articular surface and 13 mm of the posterior cortex. Fracture is mildly comminuted. Mild retraction of the proximal fragment. Associated joint effusion and large amount of soft tissue edema. The radius and distal humerus appear intact. IMPRESSION: Significantly displaced and mildly comminuted mid olecranon fracture with articular involvement. Electronically Signed   By: Jeb Levering M.D.   On: 04/01/2018 23:38   Dg Hip Unilat W Or Wo Pelvis 2-3 Views Left  Result Date: 04/01/2018 CLINICAL DATA:  Fall at home with left hip pain. EXAM: DG HIP (WITH OR WITHOUT PELVIS) 2-3V LEFT COMPARISON:  None. FINDINGS: Impacted  fracture of the left femoral neck with mild angulation. Femoral head remains seated. Minimally displaced fracture of  the left superior pubic ramus and nondisplaced inferior ramus fracture are age indeterminate. Right femoral head is seated in the acetabulum. Pubic symphysis and sacroiliac joints are congruent. The bones are under mineralized. Multiple pelvic phleboliths. IMPRESSION: 1. Impacted fracture of the left femoral neck with angulation. 2. Left superior and inferior pubic ramus fractures are age indeterminate, but may be acute. Electronically Signed   By: Jeb Levering M.D.   On: 04/01/2018 23:35    Pending Labs Unresulted Labs (From admission, onward)   None      Vitals/Pain Today's Vitals   04/02/18 0030 04/02/18 0200 04/02/18 0230 04/02/18 0300  BP: (!) 153/79     Pulse: 80 81 86 79  Resp:  _0 Temp:      TempSrc:      SpO2: 98% 98% 98% 96%  PainSc:        Isolation Precautions No active isolations  Medications Medications  morphine 4 MG/ML injection 0.52 mg (has no administration in time range)    Mobility walks

## 2018-04-02 NOTE — Addendum Note (Signed)
Addendum  created 04/02/18 2149 by Murvin Natal, MD   Sign clinical note

## 2018-04-02 NOTE — H&P (Addendum)
History and Physical    Lindsey Patel FTD:[id number] DOB: April 17, 1927 DOA: 04/01/2018  PCP: Ann Held, DO  Patient coming from: Home.  Chief Complaint: Fall.  HPI: Lindsey Patel is a 84 y.o. female with history of CLL, hyperlipidemia had a fall while patient was walking after visiting a restaurant.  Patient slipped over the curb and fell.  Following which patient hurt her left elbow and the left lower extremity.  Denies hitting her head or losing consciousness.  Denies any chest pain or shortness of breath.  ED Course: In the ER x-rays revealed left hip fracture left elbow fracture and possible left sided pelvic fracture.  Dr. Mardelle Matte on-call orthopedic surgeon has been consulted patient admitted for possible surgery.  Review of Systems: As per HPI, rest all negative.   Past Medical History:  Diagnosis Date  . Chronic lymphocytic leukemia 09/22/2007   Qualifier: Diagnosis of  By: Jerold Coombe    . Hyperlipidemia   . Leukemia ([place])   . Osteoporosis     Past Surgical History:  Procedure Laterality Date  . CATARACT EXTRACTION  6/03   left  . CATARACT EXTRACTION  4/09   right  . TUBAL LIGATION       reports that she quit smoking about 54 years ago. Her smoking use included cigarettes. She has a 2.00 pack-year smoking history. She has never used smokeless tobacco. She reports that she does not drink alcohol or use drugs.  Allergies  Allergen Reactions  . Pneumococcal Vaccines Other (See Comments)    Had a local skin reaction    Family History  Problem Relation Age of Onset  . Stroke Mother   . Cancer Father        stomach  . Parkinsonism Brother   . Cancer Brother        lung  . Heart disease Brother        MI  . Alzheimer's disease Sister   . Stroke Unknown   . Alzheimer's disease Unknown   . Dementia Unknown   . Parkinsonism Unknown     Prior to Admission medications   Medication Sig Start Date End Date Taking? Authorizing Provider  aspirin  EC 81 MG tablet Take 81 mg by mouth daily.   Yes [provider]  calcium carbonate (OS-CAL) 600 MG TABS Take 600 mg by mouth 2 (two) times daily with a meal.   Yes [provider]  fish oil-omega-3 fatty acids 1000 MG capsule Take 2 g by mouth 2 (two) times daily.    Yes [provider]  Flaxseed, Linseed, (FLAXSEED OIL PO) Take 1,400 mg by mouth daily.   Yes [provider]  Multiple Vitamin (MULTIVITAMIN) tablet Take 1 tablet by mouth daily.   Yes [provider]  simvastatin (ZOCOR) 40 MG tablet TAKE 1 TABLET BY MOUTH EVERY DAY AT 6 PM 01/23/18  Yes Ann Held, [place]    Physical Exam: Vitals:   04/01/18 2230 04/01/18 2231 04/01/18 2315 04/02/18 0030  BP: (!) 150/63  129/65 (!) 153/79  Pulse: 83 80 85 80  Resp:      Temp:      TempSrc:      SpO2:  91% (!) 89% 98%      Constitutional: Moderately built and nourished. Vitals:   04/01/18 2230 04/01/18 2231 04/01/18 2315 04/02/18 0030  BP: (!) 150/63  129/65 (!) 153/79  Pulse: 83 80 85 80  Resp:  Temp:      TempSrc:      SpO2:  91% (!) 89% 98%   Eyes: Anicteric no pallor. ENMT: No discharge from the ears eyes nose or mouth. Neck: No mass felt.  No neck rigidity. Respiratory: No rhonchi or crepitations. Cardiovascular: S1-S2 heard no murmurs appreciated. Abdomen: Soft nontender bowel sounds present. Musculoskeletal: Pain on moving left hip.  Left elbow is brace. Skin: No rash. Neurologic: Alert awake oriented to time place and person.  Moves all extremities. Psychiatric: Appears normal.  Normal affect.   Labs on Admission: I have personally reviewed following labs and imaging studies  CBC: Recent Labs  Lab 04/02/18 0051  WBC 26.1*  HGB 12.8  HCT 37.5  MCV 91.9  PLT 259   Basic Metabolic Panel: Recent Labs  Lab 04/02/18 0051  NA 139  K 4.4  CL 106  CO2 22  GLUCOSE 163*  BUN 13  CREATININE 0.50  CALCIUM 9.1   GFR: CrCl cannot be calculated (Unknown  ideal weight.). Liver Function Tests: No results for input(s): AST, ALT, ALKPHOS, BILITOT, PROT, ALBUMIN in the last 168 hours. No results for input(s): LIPASE, AMYLASE in the last 168 hours. No results for input(s): AMMONIA in the last 168 hours. Coagulation Profile: No results for input(s): INR, PROTIME in the last 168 hours. Cardiac Enzymes: No results for input(s): CKTOTAL, CKMB, CKMBINDEX, TROPONINI in the last 168 hours. BNP (last 3 results) No results for input(s): PROBNP in the last 8760 hours. HbA1C: No results for input(s): HGBA1C in the last 72 hours. CBG: No results for input(s): GLUCAP in the last 168 hours. Lipid Profile: No results for input(s): CHOL, HDL, LDLCALC, TRIG, CHOLHDL, LDLDIRECT in the last 72 hours. Thyroid Function Tests: No results for input(s): TSH, T4TOTAL, FREET4, T3FREE, THYROIDAB in the last 72 hours. Anemia Panel: No results for input(s): VITAMINB12, FOLATE, FERRITIN, TIBC, IRON, RETICCTPCT in the last 72 hours. Urine analysis:    Component Value Date/Time   BILIRUBINUR neg 09/10/2016 1553   PROTEINUR neg 09/10/2016 1553   UROBILINOGEN 0.2 09/10/2016 1553   NITRITE neg 09/10/2016 1553   LEUKOCYTESUR large (3+) (A) 09/10/2016 1553   Sepsis Labs: @LABRCNTIP (procalcitonin:4,lacticidven:4) )No results found for this or any previous visit (from the past 240 hour(s)).   Radiological Exams on Admission: Dg Chest 1 View  Result Date: 04/01/2018 CLINICAL DATA:  Fall at home with left hip and elbow fractures. EXAM: CHEST  1 VIEW COMPARISON:  None. FINDINGS: The cardiomediastinal contours are normal. The lungs are clear. Pulmonary vasculature is normal. No consolidation, pleural effusion, or pneumothorax. No acute osseous abnormalities are seen. The bones are under mineralized. IMPRESSION: No acute chest findings. Electronically Signed   By: Jeb Levering M.D.   On: 04/01/2018 23:39   Dg Elbow Complete Left  Result Date: 04/01/2018 CLINICAL DATA:   Left elbow pain and bleeding after fall at home. Pain about the olecranon. EXAM: LEFT ELBOW - COMPLETE 3+ VIEW COMPARISON:  None. FINDINGS: Mid olecranon fracture with distraction of approximately 16 mm at the articular surface and 13 mm of the posterior cortex. Fracture is mildly comminuted. Mild retraction of the proximal fragment. Associated joint effusion and large amount of soft tissue edema. The radius and distal humerus appear intact. IMPRESSION: Significantly displaced and mildly comminuted mid olecranon fracture with articular involvement. Electronically Signed   By: Jeb Levering M.D.   On: 04/01/2018 23:38   Dg Hip Unilat W Or Wo Pelvis 2-3 Views Left  Result Date: 04/01/2018 CLINICAL  DATA:  Fall at home with left hip pain. EXAM: DG HIP (WITH OR WITHOUT PELVIS) 2-3V LEFT COMPARISON:  None. FINDINGS: Impacted fracture of the left femoral neck with mild angulation. Femoral head remains seated. Minimally displaced fracture of the left superior pubic ramus and nondisplaced inferior ramus fracture are age indeterminate. Right femoral head is seated in the acetabulum. Pubic symphysis and sacroiliac joints are congruent. The bones are under mineralized. Multiple pelvic phleboliths. IMPRESSION: 1. Impacted fracture of the left femoral neck with angulation. 2. Left superior and inferior pubic ramus fractures are age indeterminate, but may be acute. Electronically Signed   By: Jeb Levering M.D.   On: 04/01/2018 23:35    EKG: Independently reviewed.  Normal sinus rhythm.  Assessment/Plan Principal Problem:   Closed left hip fracture (HCC) Active Problems:   Chronic lymphocytic leukemia (HCC)   Hyperlipidemia LDL goal <100   Left elbow fracture   Pelvic fracture (HCC)   Hip fracture (HCC)    1. Left hip fracture status post mechanical fall -patient is at moderate risk for intermediate risk procedure.  Will keep n.p.o. except medications.  Pain relief medications.  Orthopedic surgeon Dr.  Mardelle Matte was consulted. 2. Hyperlipidemia on statins which could be restarted after surgery. 3. History of CLL being followed by oncologist. 4. Left elbow fracture and possible pelvic fracture to be addressed by orthopedic surgery. 5. Hyperglycemia -check hemoglobin A1c during next blood draw.   DVT prophylaxis: SCDs. Code Status: Full code. Family Communication: Patient's daughter. Disposition Plan: Possibly rehab. Consults called: Orthopedics. Admission status: Inpatient.   Rise Patience MD [facility] Pager [phone].  If 7PM-7AM, please contact night-coverage [email or web address] Password [facility]  04/02/2018, 2:59 AM

## 2018-04-02 NOTE — Consult Note (Signed)
ORTHOPAEDIC CONSULTATION  REQUESTING PHYSICIAN: Caren Griffins, MD  Chief Complaint: Left elbow and hip pain  HPI: Lindsey Patel is a 84 y.o. female who complains of acute onset left elbow and hip pain after mechanical fall that occurred yesterday.  She also had a skin tear according to the emergency room over the elbow, but no evidence for open fracture.  Pain worse with movement, better with rest.  Pain located over the left hip and left elbow, rated as moderate to severe.  Denies previous hip fracture or hip pain.  Past Medical History:  Diagnosis Date  . Chronic lymphocytic leukemia 09/22/2007   Qualifier: Diagnosis of  By: Jerold Coombe    . Hyperlipidemia   . Leukemia ([place])   . Osteoporosis    Past Surgical History:  Procedure Laterality Date  . CATARACT EXTRACTION  6/03   left  . CATARACT EXTRACTION  4/09   right  . TUBAL LIGATION     Social History   Socioeconomic History  . Marital status: Married    Spouse name: Not on file  . Number of children: Not on file  . Years of education: Not on file  . Highest education level: Not on file  Occupational History  . Occupation: retired Patent attorney: RETIRED  Social Needs  . Financial resource strain: Not on file  . Food insecurity:    Worry: Not on file    Inability: Not on file  . Transportation needs:    Medical: Not on file    Non-medical: Not on file  Tobacco Use  . Smoking status: Former Smoker    Packs/day: 0.50    Years: 4.00    Pack years: 2.00    Types: Cigarettes    Last attempt to quit: 12/13/1963    Years since quitting: 54.3  . Smokeless tobacco: Never Used  Substance and Sexual Activity  . Alcohol use: No  . Drug use: No  . Sexual activity: Not Currently    Partners: Male  Lifestyle  . Physical activity:    Days per week: Not on file    Minutes per session: Not on file  . Stress: Not on file  Relationships  . Social connections:    Talks on phone: Not on file     Gets together: Not on file    Attends religious service: Not on file    Active member of club or organization: Not on file    Attends meetings of clubs or organizations: Not on file    Relationship status: Not on file  Other Topics Concern  . Not on file  Social History Narrative   Walk when weather is ok   Family History  Problem Relation Age of Onset  . Stroke Mother   . Cancer Father        stomach  . Parkinsonism Brother   . Cancer Brother        lung  . Heart disease Brother        MI  . Alzheimer's disease Sister   . Stroke Unknown   . Alzheimer's disease Unknown   . Dementia Unknown   . Parkinsonism Unknown    Allergies  Allergen Reactions  . Pneumococcal Vaccines Other (See Comments)    Had a local skin reaction     Positive ROS: All other systems have been reviewed and were otherwise negative with the exception of those mentioned in the HPI and as above.  Physical Exam:  General: Alert, no acute distress Cardiovascular: No pedal edema Respiratory: No cyanosis, no use of accessory musculature GI: No organomegaly, abdomen is soft and non-tender Skin: Left elbow is currently bandaged and splinted, skin has apparently been repaired by the emergency room physicians. Neurologic: Sensation intact distally Psychiatric: Patient is competent for consent with normal mood and affect Lymphatic: No axillary or cervical lymphadenopathy  MUSCULOSKELETAL: Left hip has positive logroll.  EHL and FHL are intact.  Left upper extremity sensation and motor is intact throughout the fingers.  All fingers flex extend and abduct.  Assessment: Principal Problem:   Closed left hip fracture (HCC) Active Problems:   Chronic lymphocytic leukemia (HCC)   Hyperlipidemia LDL goal <100   Left elbow fracture   Pelvic fracture (HCC)   Hip fracture (HCC)   Valgus impacted left femoral neck fracture, pubic ramus fracture, left olecranon comminuted fracture, approximately 25% of the  joint.  Plan: This is an acute severe complicated constellation of injuries, particularly because she is going to have limitations with her weightbearing on her upper extremity, and will struggle to recover from a hip fracture at the same time.  I recommended left hip percutaneous pinning based on the valgus impacted nature of the fracture, and discussed the potential for AVN or nonunion or loss of fixation and the need for future arthroplasty.  I also recommended open reduction internal fixation, possible triceps advancement on the left elbow.  We also discussed the risks for infection, bleeding, nerve injury, nonunion, the need for revision surgery, loss of extensor function, among others.  This was discussed with the patient and family.  They are willing to proceed.  We're going to plan for surgery probably later today, time is yet to be determined, and it will either be myself or 1 of my partners and I have discussed this with the family.  We will try to proceed as soon as possible, and will evaluate the skin tear more fully during the surgery.      Lindsey Bridge, MD Cell [phone]   04/02/2018 8:27 AM

## 2018-04-02 NOTE — ED Notes (Signed)
Placed Pt. on 2L of O2 via nasal cannula per RN

## 2018-04-02 NOTE — Progress Notes (Signed)
Orthopedic Tech Progress Note Patient Details:  Lindsey Patel 02-25-1927 [id number]  Ortho Devices Type of Ortho Device: Post (long arm) splint, Arm sling Ortho Device/Splint Location: applied overhead frame Ortho Device/Splint Interventions: Ordered, Application   Post Interventions Patient Tolerated: Well Instructions Provided: Care of device, Adjustment of device   Braulio Bosch 04/02/2018, 5:08 PM

## 2018-04-02 NOTE — Transfer of Care (Signed)
Immediate Anesthesia Transfer of Care Note  Patient: MICHELINE MARKES  Procedure(s) Performed: OPEN REDUCTION INTERNAL FIXATION (ORIF) ELBOW/OLECRANON FRACTURE (Left Elbow) OPEN REDUCTION INTERNAL FIXATION HIP (Left Hip)  Patient Location: PACU  Anesthesia Type:GA combined with regional for post-op pain  Level of Consciousness: awake  Airway & Oxygen Therapy: Patient Spontanous Breathing and Patient connected to nasal cannula oxygen  Post-op Assessment: Report given to RN and Post -op Vital signs reviewed and stable  Post vital signs: Reviewed and stable  Last Vitals:  Vitals Value Taken Time  BP 156/72 04/02/2018  4:36 PM  Temp    Pulse 85 04/02/2018  4:42 PM  Resp 13 04/02/2018  4:42 PM  SpO2 95 % 04/02/2018  4:42 PM  Vitals shown include unvalidated device data.  Last Pain:  Vitals:   04/02/18 1139  TempSrc:   PainSc: 0-No pain         Complications: No apparent anesthesia complications

## 2018-04-02 NOTE — Anesthesia Procedure Notes (Signed)
Procedure Name: Intubation Date/Time: 04/02/2018 1:45 PM Performed by: Kyung Rudd, CRNA Pre-anesthesia Checklist: Patient identified, Emergency Drugs available, Suction available and Patient being monitored Patient Re-evaluated:Patient Re-evaluated prior to induction Oxygen Delivery Method: Circle system utilized Preoxygenation: Pre-oxygenation with 100% oxygen Induction Type: IV induction Ventilation: Mask ventilation without difficulty Laryngoscope Size: Mac and 3 Grade View: Grade II Tube type: Oral Tube size: 7.0 mm Number of attempts: 1 Airway Equipment and Method: Stylet Placement Confirmation: ETT inserted through vocal cords under direct vision,  positive ETCO2 and breath sounds checked- equal and bilateral Secured at: 21 cm Tube secured with: Tape Dental Injury: Teeth and Oropharynx as per pre-operative assessment

## 2018-04-02 NOTE — Anesthesia Postprocedure Evaluation (Signed)
Anesthesia Post Note  Patient: Lindsey Patel  Procedure(s) Performed: OPEN REDUCTION INTERNAL FIXATION (ORIF) ELBOW/OLECRANON FRACTURE (Left Elbow) OPEN REDUCTION INTERNAL FIXATION HIP (Left Hip)     Patient location during evaluation: PACU Anesthesia Type: General Level of consciousness: awake Pain management: pain level controlled Vital Signs Assessment: post-procedure vital signs reviewed and stable Respiratory status: spontaneous breathing, nonlabored ventilation, respiratory function stable and patient connected to nasal cannula oxygen Cardiovascular status: blood pressure returned to baseline and stable Postop Assessment: no apparent nausea or vomiting Anesthetic complications: no    Last Vitals:  Vitals:   04/02/18 1813 04/02/18 1815  BP: (!) 148/62 (!) 148/62  Pulse: 83 84  Resp: 10 12  Temp:    SpO2: 96% (!) 89%    Last Pain:  Vitals:   04/02/18 1650  TempSrc:   PainSc: Asleep                 Ryan P Ellender

## 2018-04-02 NOTE — Progress Notes (Signed)
PROGRESS NOTE  Lindsey Patel FUX:[id number] DOB: 26-May-1927 DOA: 04/01/2018 PCP: Ann Held, DO   LOS: 0 days   Brief Narrative / Interim history: 83 year old female with history of CLL, hyperlipidemia, who presented to the hospital on 6/12 in the morning with hip fracture and elbow fracture on the left after falling in [employer] parking lot.  Orthopedic surgery was consulted and plan for operative repair on 6/12  Assessment & Plan: Principal Problem:   Closed left hip fracture ([place]) Active Problems:   Chronic lymphocytic leukemia (HCC)   Hyperlipidemia LDL goal <100   Left elbow fracture   Pelvic fracture (HCC)   Hip fracture (HCC)   Left hip fracture/left elbow fracture -Orthopedic surgery plan for repair, Dr. Mardelle Matte was consulted  Hyperlipidemia -Resume statin after surgery  History of CLL -With chronic leukocytosis, this appears stable and will need follow-up as an outpatient  Hyperglycemia -A1c pending   DVT prophylaxis: SCDs Code Status: Full code Family Communication: daughter at bedside Disposition Plan: likely needs SNF post op   Consultants:   Orthopedic surgery   Procedures:   None   Antimicrobials:  None    Subjective: - no chest pain, shortness of breath, no abdominal pain, nausea or vomiting.   Objective: Vitals:   04/02/18 0353 04/02/18 0400 04/02/18 0500 04/02/18 0600  BP: (!) 146/87 (!) 144/71 124/71 (!) 144/68  Pulse:  80 76 77  Resp:  19 16 16   Temp:      TempSrc:      SpO2:  91% 97% 97%   No intake or output data in the 24 hours ending 04/02/18 1123 There were no vitals filed for this visit.  Examination:  Constitutional: NAD Respiratory: CTA Cardiovascular: RRR  Data Reviewed: I have independently reviewed following labs and imaging studies   CBC: Recent Labs  Lab 04/02/18 0051 04/02/18 0357  WBC 26.1* 24.4*  HGB 12.8 12.3  HCT 37.5 36.9  MCV 91.9 93.4  PLT 162 025   Basic Metabolic Panel: Recent  Labs  Lab 04/02/18 0051 04/02/18 0357  NA 139 137  K 4.4 4.3  CL 106 105  CO2 22 23  GLUCOSE 163* 162*  BUN 13 12  CREATININE 0.50 0.58  CALCIUM 9.1 8.9   GFR: CrCl cannot be calculated (Unknown ideal weight.). Liver Function Tests: No results for input(s): AST, ALT, ALKPHOS, BILITOT, PROT, ALBUMIN in the last 168 hours. No results for input(s): LIPASE, AMYLASE in the last 168 hours. No results for input(s): AMMONIA in the last 168 hours. Coagulation Profile: No results for input(s): INR, PROTIME in the last 168 hours. Cardiac Enzymes: No results for input(s): CKTOTAL, CKMB, CKMBINDEX, TROPONINI in the last 168 hours. BNP (last 3 results) No results for input(s): PROBNP in the last 8760 hours. HbA1C: No results for input(s): HGBA1C in the last 72 hours. CBG: No results for input(s): GLUCAP in the last 168 hours. Lipid Profile: No results for input(s): CHOL, HDL, LDLCALC, TRIG, CHOLHDL, LDLDIRECT in the last 72 hours. Thyroid Function Tests: No results for input(s): TSH, T4TOTAL, FREET4, T3FREE, THYROIDAB in the last 72 hours. Anemia Panel: No results for input(s): VITAMINB12, FOLATE, FERRITIN, TIBC, IRON, RETICCTPCT in the last 72 hours. Urine analysis:    Component Value Date/Time   BILIRUBINUR neg 09/10/2016 1553   PROTEINUR neg 09/10/2016 1553   UROBILINOGEN 0.2 09/10/2016 1553   NITRITE neg 09/10/2016 1553   LEUKOCYTESUR large (3+) (A) 09/10/2016 1553   Sepsis Labs: Invalid input(s): PROCALCITONIN, LACTICIDVEN  No results found for this or any previous visit (from the past 240 hour(s)).    Radiology Studies: Dg Chest 1 View  Result Date: 04/01/2018 CLINICAL DATA:  Fall at home with left hip and elbow fractures. EXAM: CHEST  1 VIEW COMPARISON:  None. FINDINGS: The cardiomediastinal contours are normal. The lungs are clear. Pulmonary vasculature is normal. No consolidation, pleural effusion, or pneumothorax. No acute osseous abnormalities are seen. The bones are  under mineralized. IMPRESSION: No acute chest findings. Electronically Signed   By: Jeb Levering M.D.   On: 04/01/2018 23:39   Dg Elbow Complete Left  Result Date: 04/01/2018 CLINICAL DATA:  Left elbow pain and bleeding after fall at home. Pain about the olecranon. EXAM: LEFT ELBOW - COMPLETE 3+ VIEW COMPARISON:  None. FINDINGS: Mid olecranon fracture with distraction of approximately 16 mm at the articular surface and 13 mm of the posterior cortex. Fracture is mildly comminuted. Mild retraction of the proximal fragment. Associated joint effusion and large amount of soft tissue edema. The radius and distal humerus appear intact. IMPRESSION: Significantly displaced and mildly comminuted mid olecranon fracture with articular involvement. Electronically Signed   By: Jeb Levering M.D.   On: 04/01/2018 23:38   Dg Hip Unilat W Or Wo Pelvis 2-3 Views Left  Result Date: 04/01/2018 CLINICAL DATA:  Fall at home with left hip pain. EXAM: DG HIP (WITH OR WITHOUT PELVIS) 2-3V LEFT COMPARISON:  None. FINDINGS: Impacted fracture of the left femoral neck with mild angulation. Femoral head remains seated. Minimally displaced fracture of the left superior pubic ramus and nondisplaced inferior ramus fracture are age indeterminate. Right femoral head is seated in the acetabulum. Pubic symphysis and sacroiliac joints are congruent. The bones are under mineralized. Multiple pelvic phleboliths. IMPRESSION: 1. Impacted fracture of the left femoral neck with angulation. 2. Left superior and inferior pubic ramus fractures are age indeterminate, but may be acute. Electronically Signed   By: Jeb Levering M.D.   On: 04/01/2018 23:35     Scheduled Meds: . chlorhexidine  60 mL Topical Once  . povidone-iodine  2 application Topical Once   Continuous Infusions: . sodium chloride    .  ceFAZolin (ANCEF) IV      Marzetta Board, MD, PhD [facility] Pager [phone]  If 7PM-7AM, please contact  night-coverage [email or web address] Password [facility] 04/02/2018, 11:23 AM

## 2018-04-02 NOTE — Anesthesia Procedure Notes (Addendum)
Anesthesia Regional Block: Supraclavicular block   Pre-Anesthetic Checklist: ,, timeout performed, Correct Patient, Correct Site, Correct Laterality, Correct Procedure, Correct Position, site marked, Risks and benefits discussed,  Surgical consent,  Pre-op evaluation,  At surgeon's request and post-op pain management  Laterality: Left and Upper  Prep: chloraprep       Needles:   Needle Type: Echogenic Stimulator Needle     Needle Length: 9cm      Additional Needles:   Narrative:  Start time: 04/02/2018 12:50 PM End time: 04/02/2018 1:02 PM Injection made incrementally with aspirations every 5 mL.  Performed by: Personally  Anesthesiologist: Rica Koyanagi, MD

## 2018-04-02 NOTE — Anesthesia Preprocedure Evaluation (Addendum)
Anesthesia Evaluation  Patient identified by MRN, date of birth, ID band Patient awake    Reviewed: Allergy & Precautions, NPO status , Patient's Chart, lab work & pertinent test results  History of Anesthesia Complications Negative for: history of anesthetic complications  Airway Mallampati: II  TM Distance: >3 FB Neck ROM: Limited    Dental  (+) Teeth Intact, Dental Advisory Given   Pulmonary neg pulmonary ROS, former smoker,    breath sounds clear to auscultation       Cardiovascular negative cardio ROS   Rhythm:Regular Rate:Normal - Systolic murmurs    Neuro/Psych    GI/Hepatic negative GI ROS, Neg liver ROS,   Endo/Other  negative endocrine ROS  Renal/GU negative Renal ROS     Musculoskeletal Fx hip and arm   Abdominal   Peds  Hematology  (+) Blood dyscrasia, , Chronic lymph leukemia   Anesthesia Other Findings   Reproductive/Obstetrics                           Anesthesia Physical Anesthesia Plan  ASA: II  Anesthesia Plan: General   Post-op Pain Management:  Regional for Post-op pain   Induction: Intravenous  PONV Risk Score and Plan: 3 and Treatment may vary due to age or medical condition  Airway Management Planned: Oral ETT  Additional Equipment:   Intra-op Plan:   Post-operative Plan:   Informed Consent: I have reviewed the patients History and Physical, chart, labs and discussed the procedure including the risks, benefits and alternatives for the proposed anesthesia with the patient or authorized representative who has indicated his/her understanding and acceptance.   Dental advisory given  Plan Discussed with: CRNA  Anesthesia Plan Comments:        Anesthesia Quick Evaluation

## 2018-04-02 NOTE — Progress Notes (Signed)
Nutrition Consult/Brief Note  Nutrition consulted via Hip Fracture Protocol. Pt admitted with closed L hip fracture; currently in OR. RD to complete nutrition assessment at later date.  Arthur Holms, RD, LDN Pager #: [phone] After-Hours Pager #: [phone]

## 2018-04-02 NOTE — Op Note (Signed)
OrthopaedicSurgeryOperativeNote (IOE:[id number]) Date of Surgery: 04/02/2018  Admit Date: 04/01/2018   Diagnoses: Pre-Op Diagnoses: Left valgus impacted femoral neck fracture Left displaced olecranon fracture   Post-Op Diagnosis: Same  Procedures: 1. CPT 27235-Percutaneous fixation of left femoral neck 2. CPT 24685-Open reduction and repair of left olecranon fracture  Surgeons: Primary: Shona Needles, MD   Location:[facility] OR ROOM 07   AnesthesiaGeneral   Antibiotics:Ancef 2g preop   Tourniquettime:None used  HWEXHBZJIRCVELFYBO:175 mL   Complications:None  Specimens:None  Implants: Implant Name Type Inv. Item Serial No. Manufacturer Lot No. LRB No. Used Action  SCREW CANN 32MM 6.5X75MM - [id number] Screw SCREW CANN 32MM 6.5X75MM  [employer]  Left 1 Implanted  SCREW 6.5MM CANN 32X85 - [id number] Screw SCREW 6.5MM CANN 32X85  [employer]  Left 1 Implanted  6.5 fully threaded screw 27mm    STRYKER [employer]UMA  Left 1 Implanted  WASHER FOR 5.0 SCREWS - [id number] Washer WASHER FOR 5.0 SCREWS  [employer]  Left 1 Implanted    IndicationsforSurgery: 84 year old female who lives alone and has no significant past medical history fell while in the [employer] parking lot.  She was able to drive herself home and walk into the house.  Unfortunately she had significant pain in her arm and leg that prompted her to present to the emergency room.  X-rays showed a valgus impacted left femoral neck fracture along with a displaced olecranon fracture.  that she would benefit from percutaneous fixation of her left femoral neck.  I felt that the fracture was relatively stable from her ability to ambulate on the leg.  I felt that these fixation would prevent displacement of the fracture and allow her to ambulate on that recovery and rehab.  I felt that likely repair of the olecranon using the triceps advancement with retaining the proximal olecranon fragment would be most appropriate to  allow bony healing of the fracture along with reinforcement of her repair.  I felt that hardware would likely have a significant risk of failure..  I discussed risks and benefits with the patient. Risks discussed included bleeding requiring blood transfusion, bleeding causing a hematoma, infection, malunion, nonunion, damage to surrounding nerves and blood vessels, pain, hardware prominence or irritation, hardware failure, stiffness, post-traumatic arthritis, DVT/PE, compartment syndrome, and even death. She agrees to proceed with surgery and consent was obtained.  Operative Findings: 1.  Percutaneous fixation of left femoral neck fracture using 6.5 mm Synthes cannulated screws as noted above. 2.  Open reduction and repair of left olecranon fracture using #2 fibertape  Procedure: The patient was identified in the preoperative holding area. Consent was confirmed with the patient and their family and all questions were answered. The operative extremity was marked after confirmation with the patient. she was then brought back to the operating room by our anesthesia colleagues.  The patient was placed under general anesthetic.  She was then carefully transferred over to a radiolucent flat top table.  A bump was placed under the operative hip.  Fluoroscopic images were obtained to confirm that the femoral neck had not displaced. The operative extremity was then prepped and draped in usual sterile fashion. A preoperative timeout was performed to verify the patient, the procedure, and the extremity. Preoperative antibiotics were dosed.  I marked out a small 2 cm incision on the side of the hip.  I split the IT band in line with our incision.  I then used 2.8 mm K wires to direct into the femoral neck and head in  an inverted triangle pattern.  I confirmed that these were within the bone and not penetrating the hip joint.  Once I was pleased with the position of the 3 guidepins I then measured drilled the outer  cortex and placed 3 cannulated screws gaining excellent purchase.  An around the world fluoroscopic image was obtained to confirm that the screws were in bone and not penetrating the joint.  The wound was then irrigated and closed with 2-0 Vicryl and 3-0 Monocryl.  A Dermabond was used to seal the skin.  A Mepilex dressing was placed over the incision.  We then turned our attention to the elbow.  The patient was carefully placed in the lateral decubitus position with her operative side up.  Her arm was placed over a bone foam.The operative extremity was then prepped and draped in usual sterile fashion. A preoperative timeout was performed to verify the patient, the procedure, and the extremity.   There is a small 2 cm laceration over the dorsal lateral aspect of the forearm that did not probe down to the fracture.  A posterior approach was made to the olecranon.  It was carried down through skin and subcutaneous tissue until the fracture was identified.  The hematoma and clot was cleaned out.  The triceps fascia was released to mobilize the proximal fragment and the attachment of the triceps.  I felt that with her osteoporosis and the small fragment that fixation would likely have a high risk of failure.  As a result I felt that proceeding with a repair of the triceps including the bone in that back down to the distal ulna would be the best option.  Using fiber tape I then proceeded to stitch the triceps tendon in a Krakw fashion.  I had to medial limbs and 2 lateral limbs.  I then placed a lateral and medial drill hole into the medullary canal and using a [place] suture passer I passed the limbs on each side and then tied them over the top and approximately 80 degrees of flexion.  The fragment did have some gapping but it maintained reduction through a gentle range of motion.  Final fluoroscopic images were obtained.  The wound was irrigated. A layered closure consisting of 2-0 Vicryl and 3-0 nylon was  used.  Sterile dressing consisting of bacitracin ointment, Adaptic, 4 x 4's and sterile cast padding was placed.  A long-arm splint was then placed in slight amount of extension.  The patient was then awoken from anesthesia and taken to PACU in stable condition.  Post Op Plan/Instructions: The patient will be weightbearing as tolerated to left lower extremity.  She will be nonweightbearing to the left upper extremity.  She will keep the splint on for approximately 2 days we will check the wound.  At that time we will likely start gentle range of motion of the elbow.  I will recommend Lovenox for DVT prophylaxis.  She will receive postoperative Ancef for surgical prophylaxis.  I was present and performed the entire surgery.  Katha Hamming, MD [facility]

## 2018-04-02 NOTE — Progress Notes (Signed)
I was asked by Dr. Mardelle Matte to take over care for Ms. she is a healthy 84 year old female who fell in the [employer] parking lot.  She drove herself home and had significant pain in her elbow and hip.  She was brought to the emergency room where she was found to have a valgus impacted left femoral neck fracture as well as a Rehfeld.  Displaced olecranon fracture.  We were consulted for evaluation and treatment.  I feel that with her ambulatory status without assist device and her injuries that she would benefit from percutaneous fixation of her left femoral neck as well as open reduction internal fixation of her left olecranon fracture with likely triceps advancement.  I discussed risks and benefits with the patient. Risks discussed included bleeding requiring blood transfusion, bleeding causing a hematoma, infection, malunion, nonunion, damage to surrounding nerves and blood vessels, pain, hardware prominence or irritation, hardware failure, stiffness, post-traumatic arthritis, DVT/PE, compartment syndrome, and even death. She agrees to proceed with surgery and consent was obtained.  Lindsey Needles, MD [facility] [phone] (phone)

## 2018-04-02 NOTE — Anesthesia Postprocedure Evaluation (Signed)
Anesthesia Post Note  Patient: Lindsey Patel  Procedure(s) Performed: OPEN REDUCTION INTERNAL FIXATION (ORIF) ELBOW/OLECRANON FRACTURE (Left Elbow) OPEN REDUCTION INTERNAL FIXATION HIP (Left Hip)     Patient location during evaluation: PACU Anesthesia Type: General and Regional Level of consciousness: awake and alert Pain management: pain level controlled Vital Signs Assessment: post-procedure vital signs reviewed and stable Respiratory status: spontaneous breathing, nonlabored ventilation, respiratory function stable and patient connected to nasal cannula oxygen Cardiovascular status: blood pressure returned to baseline and stable Postop Assessment: no apparent nausea or vomiting Anesthetic complications: no    Last Vitals:  Vitals:   04/02/18 1813 04/02/18 1815  BP: (!) 148/62 (!) 148/62  Pulse: 83 84  Resp: 10 12  Temp:    SpO2: 96% (!) 89%    Last Pain:  Vitals:   04/02/18 1650  TempSrc:   PainSc: Asleep                 Nerine Pulse P Rob Mciver

## 2018-04-03 DIAGNOSIS — D62 Acute posthemorrhagic anemia: Secondary | ICD-10-CM

## 2018-04-03 DIAGNOSIS — R52 Pain, unspecified: Secondary | ICD-10-CM

## 2018-04-03 LAB — CBC
HCT: 29 % — ABNORMAL LOW (ref 36.0–46.0)
HEMOGLOBIN: 9.6 g/dL — AB (ref 12.0–15.0)
MCH: 31 pg (ref 26.0–34.0)
MCHC: 33.1 g/dL (ref 30.0–36.0)
MCV: 93.5 fL (ref 78.0–100.0)
Platelets: 114 10*3/uL — ABNORMAL LOW (ref 150–400)
RBC: 3.1 MIL/uL — ABNORMAL LOW (ref 3.87–5.11)
RDW: 12.8 % (ref 11.5–15.5)
WBC: 16.7 10*3/uL — ABNORMAL HIGH (ref 4.0–10.5)

## 2018-04-03 LAB — TSH: TSH: 0.477 u[IU]/mL (ref 0.350–4.500)

## 2018-04-03 MED ORDER — HYDROCODONE-ACETAMINOPHEN 5-325 MG PO TABS: 1.0000 | ORAL_TABLET | Freq: Four times a day (QID) | ORAL | Status: DC | PRN

## 2018-04-03 MED ORDER — TRAMADOL HCL 50 MG PO TABS
25.0000 mg | ORAL_TABLET | Freq: Four times a day (QID) | ORAL | Status: DC | PRN
  Administered 2018-04-03 – 2018-04-05 (×6): 25 mg via ORAL
  Filled 2018-04-03 (×6): qty 1

## 2018-04-03 MED ORDER — ADULT MULTIVITAMIN W/MINERALS CH
1.0000 | ORAL_TABLET | Freq: Every day | ORAL | Status: DC
  Administered 2018-04-03 – 2018-04-06 (×4): 1 via ORAL
  Filled 2018-04-03 (×4): qty 1

## 2018-04-03 MED ORDER — VITAMIN D 1000 UNITS PO TABS
1000.0000 [IU] | ORAL_TABLET | Freq: Every day | ORAL | Status: DC
  Administered 2018-04-03 – 2018-04-06 (×4): 1000 [IU] via ORAL
  Filled 2018-04-03 (×4): qty 1

## 2018-04-03 MED ORDER — ENOXAPARIN SODIUM 40 MG/0.4ML ~~LOC~~ SOLN
40.0000 mg | SUBCUTANEOUS | Status: DC
  Administered 2018-04-03 – 2018-04-06 (×4): 40 mg via SUBCUTANEOUS
  Filled 2018-04-03 (×4): qty 0.4

## 2018-04-03 MED ORDER — SIMVASTATIN 40 MG PO TABS
40.0000 mg | ORAL_TABLET | Freq: Every day | ORAL | Status: DC
  Administered 2018-04-03: 40 mg via ORAL
  Filled 2018-04-03: qty 1

## 2018-04-03 NOTE — Evaluation (Signed)
Occupational Therapy Evaluation Patient Details Name: Lindsey Patel MRN: [id number] DOB: [birth year] Today's Date: 04/03/2018    History of Present Illness Admitted after fall in parking lot, underwent OPEN REDUCTION INTERNAL FIXATION (ORIF) ELBOW/OLECRANON FRACTURE (Left Elbow) and OPEN REDUCTION INTERNAL FIXATION HIP (Left Hip).  PMH significant for but not limited to osteoporosis.    Clinical Impression   PTA patient was independent with ADL, IADL (assist 1x/month for heavy IADL), driving and mobility.  She currently requires max assist for UB and LB ADL, max assist for bed mobility, and mod assist for functional transfers.  She requires cueing to adhere to L UE NWB precautions, and has decreased tolerance weight bearing through L LE.  Patient and family hoping for continued rehab prior to dc home, she is highly motivated.  Recommend SNF rehab at discharge due to decreased activity tolerance.  Will continue to follow patient while admitted, in order to increase tolerance and maximize independence prior to dc.     Follow Up Recommendations  SNF;Supervision/Assistance - 24 hour    Equipment Recommendations  (TBD in next venue of care)    Recommendations for Other Services       Precautions / Restrictions Precautions Precautions: Fall Precaution Comments: cueing require to adhere to NWB L UE  Restrictions Weight Bearing Restrictions: Yes LUE Weight Bearing: Non weight bearing LLE Weight Bearing: Weight bearing as tolerated      Mobility Bed Mobility Overal bed mobility: Needs Assistance Bed Mobility: Sidelying to Sit   Sidelying to sit: Max assist       General bed mobility comments: mgmt of L LE, trunk to ascend and cueing to adhere to L UE NWB precautions   Transfers Overall transfer level: Needs assistance Equipment used: 1 person hand held assist Transfers: Stand Pivot Transfers;Sit to/from Stand Sit to Stand: Mod assist Stand pivot transfers: Mod assist        General transfer comment: to recliner, increased time given cueing for sequencing and safety     Balance Overall balance assessment: History of Falls;Needs assistance Sitting-balance support: No upper extremity supported;Feet supported Sitting balance-Leahy Scale: Good     Standing balance support: Single extremity supported;During functional activity Standing balance-Leahy Scale: Fair Standing balance comment: L UE NWB, R UE support required                            ADL either performed or assessed with clinical judgement   ADL Overall ADL's : Needs assistance/impaired Eating/Feeding: Set up   Grooming: Moderate assistance Grooming Details (indicate cue type and reason): non functional use of L UE  Upper Body Bathing: Moderate assistance Upper Body Bathing Details (indicate cue type and reason): non functional use of L UE  Lower Body Bathing: Moderate assistance;Sit to/from stand;Cueing for safety   Upper Body Dressing : Maximal assistance;Cueing for UE precautions Upper Body Dressing Details (indicate cue type and reason): non functional use of L UE  Lower Body Dressing: Maximal assistance;Sit to/from stand Lower Body Dressing Details (indicate cue type and reason): non functional use of L UE, decreased functional reach, and decreased mobility in L LE from ORIF L Hip  Toilet Transfer: Moderate assistance;Stand-pivot(simulated to recliner) Toilet Transfer Details (indicate cue type and reason): stand pivot with greatly increased time and cueing, limited tolerance weight bearing through L LE; able to maintain standing with min guard assist   Toileting- Clothing Manipulation and Hygiene: Maximal assistance Toileting - Clothing Manipulation Details (indicate cue  type and reason): non functional use of L UE      Functional mobility during ADLs: Moderate assistance;Cueing for sequencing General ADL Comments: stand pivot transfer only, increased time for motor planning  and sequencing; good safety awareness       Vision Baseline Vision/History: Wears glasses Wears Glasses: Reading only(and driving ) Patient Visual Report: No change from baseline       Perception     Praxis      Pertinent Vitals/Pain Pain Assessment: Faces Faces Pain Scale: Hurts even more Pain Descriptors / Indicators: Aching;Constant;Discomfort Pain Intervention(s): Limited activity within patient's tolerance;Repositioned;Patient requesting pain meds-RN notified;RN gave pain meds during session;Monitored during session     Hand Dominance Right   Extremity/Trunk Assessment Upper Extremity Assessment Upper Extremity Assessment: Generalized weakness;LUE deficits/detail LUE Deficits / Details: s/p ORIF in splint  LUE: Unable to fully assess due to immobilization(able to wiggle fingers and raise arm at shoulder ) LUE Sensation: WNL(finger tips only) LUE Coordination: decreased gross motor;decreased fine motor(limited by splint )   Lower Extremity Assessment Lower Extremity Assessment: Defer to PT evaluation       Communication Communication Communication: HOH(hearing aides bilaterally)   Cognition Arousal/Alertness: Awake/alert Behavior During Therapy: WFL for tasks assessed/performed Overall Cognitive Status: Within Functional Limits for tasks assessed                                     General Comments  daughter present and fully supportive     Exercises     Shoulder Instructions      Home Living Family/patient expects to be discharged to:: Private residence Living Arrangements: Alone Available Help at Discharge: Family;Available 24 hours/day Type of Home: House Home Access: Stairs to enter [employer] of Steps: 5   Home Layout: One level     Bathroom Shower/Tub: Occupational psychologist: Standard     Home Equipment: None   Additional Comments: family hoping for rehab at dc, able to dc to daughters home if needed        Prior Functioning/Environment Level of Independence: Independent        Comments: patient reports independent with self care, mobility and transfers, as well as driving and IADLs (1x/month assist with heavy IADLs)        OT Problem List: Decreased strength;Impaired balance (sitting and/or standing);Decreased knowledge of precautions;Pain;Decreased range of motion;Impaired UE functional use;Decreased knowledge of use of DME or AE;Decreased activity tolerance;Decreased coordination      OT Treatment/Interventions: Self-care/ADL training;DME and/or AE instruction;Therapeutic activities;Balance training;Energy conservation;Patient/family education;Therapeutic exercise    OT Goals(Current goals can be found in the care plan section) Acute Rehab OT Goals Patient Stated Goal: to be able to get back to what I was before  OT Goal Formulation: With patient Time For Goal Achievement: 04/17/18 Potential to Achieve Goals: Good  OT Frequency: Min 2X/week   Barriers to D/C:            Co-evaluation              AM-PAC PT "6 Clicks" Daily Activity     Outcome Measure Help from another person eating meals?: A Little Help from another person taking care of personal grooming?: A Little Help from another person toileting, which includes using toliet, bedpan, or urinal?: A Lot Help from another person bathing (including washing, rinsing, drying)?: A Lot Help from another person to put on and taking  off regular upper body clothing?: A Lot Help from another person to put on and taking off regular lower body clothing?: A Lot 6 Click Score: 14   End of Session Equipment Utilized During Treatment: Gait belt Nurse Communication: Mobility status;Patient requests pain meds  Activity Tolerance: Patient tolerated treatment well;No increased pain;Patient limited by fatigue Patient left: with call bell/phone within reach;in chair;with family/visitor present;with SCD's reapplied  OT Visit  Diagnosis: Unsteadiness on feet (R26.81);Muscle weakness (generalized) (M62.81)                Time: 7017-7939 OT Time Calculation (min): 46 min Charges:  OT General Charges $OT Visit: 1 Visit OT Evaluation $OT Eval Moderate Complexity: 1 Mod OT Treatments $Self Care/Home Management : 8-22 mins $Therapeutic Activity: 8-22 mins G-Codes:     Delight Stare, OTR/L  Pager [phone]  Delight Stare 04/03/2018, 10:39 AM

## 2018-04-03 NOTE — Evaluation (Signed)
Physical Therapy Evaluation Patient Details Name: Lindsey Patel MRN: [id number] DOB: [birth year] Today's Date: 04/03/2018   History of Present Illness  Pt is an 84 y/o female admitted after fall in parking lot, underwent OPEN REDUCTION INTERNAL FIXATION (ORIF) ELBOW/OLECRANON FRACTURE (Left Elbow) and OPEN REDUCTION INTERNAL FIXATION HIP (Left Hip).  PMH significant for but not limited to osteoporosis.     Clinical Impression  Pt presented sitting OOB in recliner chair, awake and willing to participate in therapy session. Pt's son and daughter present throughout. Prior to admission, pt reported that she was independent with all functional mobility and ADLs. Pt currently requires mod A x2 for transfers and min A x2 to ambulate a very short distance with hemi-walker for support. Pt would continue to benefit from skilled physical therapy services at this time while admitted and after d/c to address the below listed limitations in order to improve overall safety and independence with functional mobility.      Follow Up Recommendations SNF    Equipment Recommendations  None recommended by PT    Recommendations for Other Services       Precautions / Restrictions Precautions Precautions: Fall Restrictions Weight Bearing Restrictions: Yes LUE Weight Bearing: Non weight bearing LLE Weight Bearing: Weight bearing as tolerated      Mobility  Bed Mobility               General bed mobility comments: pt OOB in recliner chair upon arrival  Transfers Overall transfer level: Needs assistance Equipment used: Hemi-walker Transfers: Sit to/from Stand Sit to Stand: Mod assist;+2 safety/equipment;+2 physical assistance         General transfer comment: increased time and effort, cueing for safe hand placement with R UE and to maintain NWB L UE, assist to power into standing and with stability  Ambulation/Gait Ambulation/Gait assistance: Min assist;+2 physical assistance;+2  safety/equipment Gait Distance (Feet): 5 Feet Assistive device: Hemi-walker Gait Pattern/deviations: Step-to pattern;Decreased step length - right;Decreased step length - left;Decreased stride length;Decreased stance time - left;Decreased weight shift to left Gait velocity: decreased Gait velocity interpretation: <1.31 ft/sec, indicative of household ambulator General Gait Details: pt with modest instability requiring constant min A, cueing for sequencing with hemi-walker. pt fatiguing very quickly and limited in distance  Stairs            Wheelchair Mobility    Modified Rankin (Stroke Patients Only)       Balance Overall balance assessment: History of Falls;Needs assistance Sitting-balance support: Feet supported Sitting balance-Leahy Scale: Fair     Standing balance support: Single extremity supported;During functional activity Standing balance-Leahy Scale: Poor                               Pertinent Vitals/Pain Pain Assessment: Faces Faces Pain Scale: Hurts even more Pain Location: L UE, L LE Pain Descriptors / Indicators: Sore;Grimacing;Guarding Pain Intervention(s): Monitored during session;Repositioned    Home Living Family/patient expects to be discharged to:: Private residence Living Arrangements: Alone Available Help at Discharge: Family;Available 24 hours/day Type of Home: House Home Access: Stairs to enter   [employer] of Steps: 5 Home Layout: One level Home Equipment: None Additional Comments: family hoping for rehab at dc, able to dc to daughters home if needed     Prior Function Level of Independence: Independent               Hand Dominance   Dominant Hand: Right  Extremity/Trunk Assessment   Upper Extremity Assessment Upper Extremity Assessment: Defer to OT evaluation    Lower Extremity Assessment Lower Extremity Assessment: LLE deficits/detail LLE Deficits / Details: pt with decreased pain and ROM  limitations secondary to post-op pain and weakness LLE: Unable to fully assess due to pain       Communication   Communication: HOH  Cognition Arousal/Alertness: Awake/alert Behavior During Therapy: WFL for tasks assessed/performed Overall Cognitive Status: Within Functional Limits for tasks assessed                                        General Comments      Exercises     Assessment/Plan    PT Assessment Patient needs continued PT services  PT Problem List Decreased strength;Decreased activity tolerance;Decreased range of motion;Decreased balance;Decreased mobility;Decreased coordination;Decreased knowledge of use of DME;Decreased safety awareness;Decreased knowledge of precautions;Pain       PT Treatment Interventions DME instruction;Gait training;Stair training;Functional mobility training;Therapeutic activities;Therapeutic exercise;Neuromuscular re-education;Balance training;Patient/family education    PT Goals (Current goals can be found in the Care Plan section)  Acute Rehab PT Goals Patient Stated Goal: return to independence PT Goal Formulation: With patient/family Time For Goal Achievement: 04/17/18 Potential to Achieve Goals: Good    Frequency Min 3X/week   Barriers to discharge        Co-evaluation               AM-PAC PT "6 Clicks" Daily Activity  Outcome Measure Difficulty turning over in bed (including adjusting bedclothes, sheets and blankets)?: Unable Difficulty moving from lying on back to sitting on the side of the bed? : Unable Difficulty sitting down on and standing up from a chair with arms (e.g., wheelchair, bedside commode, etc,.)?: Unable Help needed moving to and from a bed to chair (including a wheelchair)?: A Little Help needed walking in hospital room?: A Lot Help needed climbing 3-5 steps with a railing? : Total 6 Click Score: 9    End of Session Equipment Utilized During Treatment: Gait belt Activity  Tolerance: Patient limited by fatigue;Patient limited by pain Patient left: in chair;with call bell/phone within reach;with family/visitor present Nurse Communication: Mobility status PT Visit Diagnosis: Other abnormalities of gait and mobility (R26.89);Muscle weakness (generalized) (M62.81);Pain Pain - Right/Left: Left Pain - part of body: Leg;Arm    Time: 5093-2671 PT Time Calculation (min) (ACUTE ONLY): 22 min   Charges:   PT Evaluation $PT Eval Moderate Complexity: 1 Mod     PT G Codes:        [place], PT, DPT [place] 04/03/2018, 2:36 PM

## 2018-04-03 NOTE — NC FL2 (Signed)
[place] LEVEL OF CARE SCREENING TOOL     IDENTIFICATION  Patient Name: Lindsey Patel Birthdate: [birth year] Sex: female Admission Date (Current Location): 04/01/2018  [facility] and [place] Number:  Herbalist and Address:  [facility], [place] [facility] [address] [place], [place] [address]      Provider Number: [id number]  Attending Physician Name and Address:  Caren Griffins, MD  Relative Name and Phone Number:  Domenic Schwab, [phone]    Current Level of Care: Hospital Recommended Level of Care: [place] Prior Approval Number:    Date Approved/Denied:   PASRR Number: [id number]  Discharge Plan: SNF    Current Diagnoses: Patient Active Problem List   Diagnosis Date Noted  . Closed left hip fracture ([place]) 04/02/2018  . Closed fracture of left olecranon process 04/02/2018  . Pelvic fracture ([place]) 04/02/2018  . Closed displaced fracture of left femoral neck ([place]) 04/02/2018  . Fall against object 04/04/2015  . Anxiety state, unspecified 07/24/2013  . Dyspepsia 07/24/2013  . Chronic lymphocytic leukemia ([place]) 09/22/2007  . URI 09/22/2007  . DISEASE, WHITE BLOOD CELL NEC 07/01/2007  . SINUSITIS, ACUTE NOS 06/17/2007  . Leukocytosis 05/27/2007  . Hyperlipidemia LDL goal <100 05/07/2007  . OSTEOPOROSIS 05/07/2007    Orientation RESPIRATION BLADDER Height & Weight     Self, Time, Situation, Place  O2 Continent Weight: 124 lb (56.2 kg) Height:  5\' 2"  (157.5 cm)  BEHAVIORAL SYMPTOMS/MOOD NEUROLOGICAL BOWEL NUTRITION STATUS      Continent Diet(See DC Summary)  AMBULATORY STATUS COMMUNICATION OF NEEDS Skin   Extensive Assist(Min assist;+2 physical assistance;+) Verbally Surgical wounds                       Personal Care Assistance Level of Assistance  Bathing, Feeding, Dressing Bathing Assistance: Maximum assistance Feeding assistance: Limited assistance Dressing Assistance: Maximum  assistance     Functional Limitations Info  Sight, Hearing, Speech Sight Info: Adequate Hearing Info: Adequate Speech Info: Adequate    SPECIAL CARE FACTORS FREQUENCY  PT (By licensed PT), OT (By licensed OT)     PT Frequency: 2x week OT Frequency: 3x week            Contractures Contractures Info: Not present    Additional Factors Info  Code Status, Allergies Code Status Info: Full code Allergies Info: PNEUMOCOCCAL VACCINES            Current Medications (04/03/2018):  This is the current hospital active medication list Current Facility-Administered Medications  Medication Dose Route Frequency Provider Last Rate Last Dose  . ceFAZolin (ANCEF) IVPB 2g/100 mL premix  2 g Intravenous Q8H Haddix, Thomasene Lot, MD   Stopped at 04/03/18 1442  . cholecalciferol (VITAMIN D) tablet 1,000 Units  1,000 Units Oral Daily Haddix, Thomasene Lot, MD   1,000 Units at 04/03/18 [phone]  . enoxaparin (LOVENOX) injection 40 mg  40 mg Subcutaneous Q24H Haddix, Thomasene Lot, MD   40 mg at 04/03/18 0939  . HYDROcodone-acetaminophen (NORCO/VICODIN) 5-325 MG per tablet 1 tablet  1 tablet Oral Q6H PRN Caren Griffins, MD      . lactated ringers infusion   Intravenous Continuous Haddix, Thomasene Lot, MD 10 mL/hr at 04/02/18 1221    . multivitamin with minerals tablet 1 tablet  1 tablet Oral Daily Caren Griffins, MD   1 tablet at 04/03/18 1402  . traMADol (ULTRAM) tablet 25 mg  25 mg Oral Q6H PRN  Caren Griffins, MD   25 mg at 04/03/18 9242     Discharge Medications: Please see discharge summary for a list of discharge medications.  Relevant Imaging Results:  Relevant Lab Results:   Additional Information SS#: [id number]  Normajean Baxter, LCSW

## 2018-04-03 NOTE — Progress Notes (Signed)
Initial Nutrition Assessment  DOCUMENTATION CODES:   Not applicable  INTERVENTION:   -MVI with minerals daily  NUTRITION DIAGNOSIS:   Increased nutrient needs related to post-op healing as evidenced by estimated needs.  GOAL:   Patient will meet greater than or equal to 90% of their needs  MONITOR:   PO intake, Supplement acceptance, Labs, Weight trends, Skin, I & O's  REASON FOR ASSESSMENT:   Consult Hip fracture protocol  ASSESSMENT:   Lindsey Patel is a 84 y.o. female with history of CLL, hyperlipidemia had a fall while patient was walking after visiting a restaurant.  Patient slipped over the curb and fell.  Following which patient hurt her left elbow and the left lower extremity.  Denies hitting her head or losing consciousness.  Denies any chest pain or shortness of breath.  Pt admitted with closed lt hip fracture s/p mechanical fall.   6/12- s/p procedures: percutaneous fixation of left femoral neck and open reduction and repair of left olecranon fracture  Spoke with pt and family members at bedside. Pt shares that she always has a good appetite ("sometimes too good"). Pt came from home alone and consumes 3 meals per day (Breakfast: scrambled egg and muffin OR cereal; Lunch: soup and sandwich; Dinner: meal from [employer]). She reports that she does eat out a lot secondary to living by herself. She consumed most of her breakfast this morning per her daughter (documented meal completion 100%).   Pt denies any wt loss. She reports UBW around 127#. Noted pt has experienced a 4.6% wt loss over the past year, which is not significant for time frame.   Discussed importance of good meal intake to promote healing. Pt denies any nutrition needs at this time, however, expressed appreciation for visit.   Noted mild muscle depletion in both upper and lower extremities, which is common for pts of advanced age.   Labs reviewed.   NUTRITION - FOCUSED PHYSICAL EXAM:    Most Recent Value  Orbital Region  No depletion  Upper Arm Region  Mild depletion  Thoracic and Lumbar Region  No depletion  Buccal Region  No depletion  Temple Region  No depletion  Clavicle Bone Region  No depletion  Clavicle and Acromion Bone Region  No depletion  Scapular Bone Region  No depletion  Dorsal Hand  No depletion  Patellar Region  Mild depletion  Anterior Thigh Region  Mild depletion  Posterior Calf Region  Mild depletion  Edema (RD Assessment)  Mild  Hair  Reviewed  Eyes  Reviewed  Mouth  Reviewed  Skin  Reviewed  Nails  Reviewed       Diet Order:   Diet Order           Diet regular Room service appropriate? Yes; Fluid consistency: Thin  Diet effective now          EDUCATION NEEDS:   Education needs have been addressed  Skin:  Skin Assessment: Skin Integrity Issues: Skin Integrity Issues:: Incisions Incisions: closed lt hip, closed lt elbow  Last BM:  04/01/18  Height:   Ht Readings from Last 1 Encounters:  04/02/18 5\' 2"  (1.575 m)    Weight:   Wt Readings from Last 1 Encounters:  04/02/18 124 lb (56.2 kg)    Ideal Body Weight:  50 kg  BMI:  Body mass index is 22.68 kg/m.  Estimated Nutritional Needs:   Kcal:  1400-1600  Protein:  60-75 grams  Fluid:  1.4-1.6 L  Lindsey Patel A. Jimmye Norman, RD, LDN, CDE Pager: [phone] After hours Pager: [phone]

## 2018-04-03 NOTE — Progress Notes (Signed)
Orthopaedic Trauma Progress Note  S: Doing well this AM, pain controlled. No issues overnight. Elbow more sore than left hip  O:  Vitals:   04/03/18 0015 04/03/18 0638  BP: (!) 160/71 (!) 160/53  Pulse: 77 72  Resp: 15   Temp: 98.8 F (37.1 C) 98.2 F (36.8 C)  SpO2: 94% 95%   Gen: NAD, AAOx3 Breathing without difficulty LUE: splint in place, motor and senosry intact to median, radial and ulnar nerves LLE: Dressing clean, dry and intact, Gentle ROM without pain at the hip. Motor and sensory intact  Imaging: Stable and appropriate post op imaging  Labs:  Results for orders placed or performed during the hospital encounter of 04/01/18 (from the past 24 hour(s))  MRSA PCR Screening     Status: None   Collection Time: 04/02/18 12:06 PM  Result Value Ref Range   MRSA by PCR NEGATIVE NEGATIVE  CBC     Status: Abnormal   Collection Time: 04/03/18  6:09 AM  Result Value Ref Range   WBC 16.7 (H) 4.0 - 10.5 K/uL   RBC 3.10 (L) 3.87 - 5.11 MIL/uL   Hemoglobin 9.6 (L) 12.0 - 15.0 g/dL   HCT 29.0 (L) 36.0 - 46.0 %   MCV 93.5 78.0 - 100.0 fL   MCH 31.0 26.0 - 34.0 pg   MCHC 33.1 30.0 - 36.0 g/dL   RDW 12.8 11.5 - 15.5 %   Platelets 114 (L) 150 - 400 K/uL    Assessment: 84 year old female s/p fall  Injuries: 1. Left impacted femoral neck fracture s/p CRPP 2. Displaced olecranon fracture  Weightbearing: WBAT LLE, NWB LUE  Insicional and dressing care: Keep dressing intact for now  Orthopedic device(s):Splint to LUE  CV/Blood loss: Hgb stable this AM at 9.6  Pain management: Currently only on tramadol  VTE prophylaxis: To start lovenox today 40 mg and continue for 30 days postoperatively  ID: 24 hrs of postoperative ancef  Foley/Lines: Foley in place, to remove after physical therapy  Impediments to Fracture Healing: Osteoporosis, would recommend outpatient workup by PCP  Will order vitamin D level and TSH in hospital and start on 1000 units vit D daily  Dispo: Pending  PT/OT but likely SNF  Follow - up plan: 2-3 weeks in outpatient setting  Shona Needles, MD [facility] [phone] (phone)

## 2018-04-03 NOTE — Social Work (Signed)
CSW f/u with floor RN as there is no order for PT and a PT evaluation will be needed for SNF placement.  CSW will continue to follow.  Elissa Hefty, LCSW Clinical Social Worker [phone]

## 2018-04-03 NOTE — Progress Notes (Signed)
PROGRESS NOTE  Lindsey Patel VOZ:[id number] DOB: 17-Feb-1927 DOA: 04/01/2018 PCP: Ann Held, DO   LOS: 1 day   Brief Narrative / Interim history: 84 year old female with history of CLL, hyperlipidemia, who presented to the hospital on 6/12 in the morning with hip fracture and elbow fracture on the left after falling in [employer] parking lot.  Orthopedic surgery was consulted and plan for operative repair on 6/12  Assessment & Plan: Principal Problem:   Closed left hip fracture ([place]) Active Problems:   Chronic lymphocytic leukemia ([place])   Hyperlipidemia LDL goal <100   Closed fracture of left olecranon process   Pelvic fracture ([facility])   Closed displaced fracture of left femoral neck ([facility])   Left hip fracture/left elbow fracture -Orthopedic surgery consulted, patient is s/p operative repair on 6/12, underwent percutaneous fixation of left femoral neck and open reduction and repair of left olecranon fracture   Acute blood loss amenia / thrombocytopenia  -post op, monitor Hb, 12.8 on admission and 9.6 this morning, possibly a dilutional component as well -no need for transfusion -slightly decreased platelets, likely consumptive process, continue to monitor.   Hyperlipidemia -Resume statin tomorrow  History of CLL -With chronic leukocytosis, this appears stable and will need follow-up as an outpatient -WBC improving today, suspect slight increase also in the setting of fracture   Hyperglycemia -no history of DM   DVT prophylaxis: SCDs Code Status: Full code Family Communication: daughter at bedside Disposition Plan: likely needs SNF post op   Consultants:   Orthopedic surgery   Procedures:   None   Antimicrobials:  None    Subjective: - no chest pain, shortness of breath, no abdominal pain, nausea or vomiting.   Objective: Vitals:   04/02/18 1813 04/02/18 1815 04/03/18 0015 04/03/18 0638  BP: (!) 148/62 (!) 148/62 (!) 160/71 (!) 160/53  Pulse: 83  84 77 72  Resp: 10 12 15    Temp:   98.8 F (37.1 C) 98.2 F (36.8 C)  TempSrc:   Axillary Oral  SpO2: 96% (!) 89% 94% 95%  Weight:      Height:        Intake/Output Summary (Last 24 hours) at 04/03/2018 1310 Last data filed at 04/03/2018 1100 Gross per 24 hour  Intake 4113.34 ml  Output 275 ml  Net 3838.34 ml   Filed Weights   04/02/18 1218  Weight: 56.2 kg (124 lb)    Examination:  Constitutional: NAD, calm, comfortable Eyes: PERRL, lids and conjunctivae normal ENMT: Mucous membranes are moist.  Neck: normal, supple Respiratory: clear to auscultation bilaterally, no wheezing, no crackles. Normal respiratory effort.  Cardiovascular: Regular rate and rhythm, no murmurs / rubs / gallops. No LE edema. 2+ pedal pulses.  Abdomen: no tenderness. Bowel sounds positive.  Skin: no rashes, lesions, ulcers. No induration Neurologic: non focal   Data Reviewed: I have independently reviewed following labs and imaging studies   CBC: Recent Labs  Lab 04/02/18 0051 04/02/18 0357 04/03/18 0609  WBC 26.1* 24.4* 16.7*  HGB 12.8 12.3 9.6*  HCT 37.5 36.9 29.0*  MCV 91.9 93.4 93.5  PLT 162 156 425*   Basic Metabolic Panel: Recent Labs  Lab 04/02/18 0051 04/02/18 0357  NA 139 137  K 4.4 4.3  CL 106 105  CO2 22 23  GLUCOSE 163* 162*  BUN 13 12  CREATININE 0.50 0.58  CALCIUM 9.1 8.9   GFR: Estimated Creatinine Clearance: 38.4 mL/min (by C-G formula based on SCr of 0.58 mg/dL). Liver  Function Tests: No results for input(s): AST, ALT, ALKPHOS, BILITOT, PROT, ALBUMIN in the last 168 hours. No results for input(s): LIPASE, AMYLASE in the last 168 hours. No results for input(s): AMMONIA in the last 168 hours. Coagulation Profile: No results for input(s): INR, PROTIME in the last 168 hours. Cardiac Enzymes: No results for input(s): CKTOTAL, CKMB, CKMBINDEX, TROPONINI in the last 168 hours. BNP (last 3 results) No results for input(s): PROBNP in the last 8760  hours. HbA1C: No results for input(s): HGBA1C in the last 72 hours. CBG: No results for input(s): GLUCAP in the last 168 hours. Lipid Profile: No results for input(s): CHOL, HDL, LDLCALC, TRIG, CHOLHDL, LDLDIRECT in the last 72 hours. Thyroid Function Tests: Recent Labs    04/03/18 0849  TSH 0.477   Anemia Panel: No results for input(s): VITAMINB12, FOLATE, FERRITIN, TIBC, IRON, RETICCTPCT in the last 72 hours. Urine analysis:    Component Value Date/Time   BILIRUBINUR neg 09/10/2016 1553   PROTEINUR neg 09/10/2016 1553   UROBILINOGEN 0.2 09/10/2016 1553   NITRITE neg 09/10/2016 1553   LEUKOCYTESUR large (3+) (A) 09/10/2016 1553   Sepsis Labs: Invalid input(s): PROCALCITONIN, LACTICIDVEN  Recent Results (from the past 240 hour(s))  MRSA PCR Screening     Status: None   Collection Time: 04/02/18 12:06 PM  Result Value Ref Range Status   MRSA by PCR NEGATIVE NEGATIVE Final    Comment:        The GeneXpert MRSA Assay (FDA approved for NASAL specimens only), is one component of a comprehensive MRSA colonization surveillance program. It is not intended to diagnose MRSA infection nor to guide or monitor treatment for MRSA infections. Performed at [facility], [place] [address], [place], [place] [address]       Radiology Studies: Dg Chest 1 View  Result Date: 04/01/2018 CLINICAL DATA:  Fall at home with left hip and elbow fractures. EXAM: CHEST  1 VIEW COMPARISON:  None. FINDINGS: The cardiomediastinal contours are normal. The lungs are clear. Pulmonary vasculature is normal. No consolidation, pleural effusion, or pneumothorax. No acute osseous abnormalities are seen. The bones are under mineralized. IMPRESSION: No acute chest findings. Electronically Signed   By: Jeb Levering M.D.   On: 04/01/2018 23:39   Dg Elbow 2 Views Left  Result Date: 04/02/2018 CLINICAL DATA:  Status post olecranon fracture repair. EXAM: LEFT ELBOW - 2 VIEW COMPARISON:  None. FINDINGS:  The patient is now within a cast. The olecranon fracture has been partially reduced with improved alignment. IMPRESSION: Olecranon non fracture reduction as above. Electronically Signed   By: Dorise Bullion III M.D   On: 04/02/2018 18:10   Dg Elbow 2 Views Left  Result Date: 04/02/2018 CLINICAL DATA:  Olecranon fracture EXAM: LEFT ELBOW - 2 VIEW; DG C-ARM 61-120 MIN COMPARISON:  04/01/2018 FLUOROSCOPY TIME:  Fluoroscopy Time:  16 seconds Radiation Exposure Index (if provided by the fluoroscopic device): Not available Number of Acquired Spot Images: 4 FINDINGS: The previously seen olecranon fracture is again identified. It was subsequently reduced. The termination film demonstrates near anatomic alignment of the fracture fragments. IMPRESSION: Reduction of olecranon fracture. Electronically Signed   By: Inez Catalina M.D.   On: 04/02/2018 16:17   Dg Elbow Complete Left  Result Date: 04/01/2018 CLINICAL DATA:  Left elbow pain and bleeding after fall at home. Pain about the olecranon. EXAM: LEFT ELBOW - COMPLETE 3+ VIEW COMPARISON:  None. FINDINGS: Mid olecranon fracture with distraction of approximately 16 mm at the  articular surface and 13 mm of the posterior cortex. Fracture is mildly comminuted. Mild retraction of the proximal fragment. Associated joint effusion and large amount of soft tissue edema. The radius and distal humerus appear intact. IMPRESSION: Significantly displaced and mildly comminuted mid olecranon fracture with articular involvement. Electronically Signed   By: Jeb Levering M.D.   On: 04/01/2018 23:38   Dg C-arm 1-60 Min  Result Date: 04/02/2018 CLINICAL DATA:  Left hip fracture repair FLUOROSCOPY TIME:  2 minutes and 45 seconds. Images: 7 EXAM: DG C-ARM 61-120 MIN COMPARISON:  None. FINDINGS: Three screws have been placed across the left hip fracture by the end of the study. IMPRESSION: Three screws have been placed across the left hip fracture by the end of the study.  Electronically Signed   By: Dorise Bullion III M.D   On: 04/02/2018 16:24   Dg C-arm 1-60 Min  Result Date: 04/02/2018 CLINICAL DATA:  Olecranon fracture EXAM: LEFT ELBOW - 2 VIEW; DG C-ARM 61-120 MIN COMPARISON:  04/01/2018 FLUOROSCOPY TIME:  Fluoroscopy Time:  16 seconds Radiation Exposure Index (if provided by the fluoroscopic device): Not available Number of Acquired Spot Images: 4 FINDINGS: The previously seen olecranon fracture is again identified. It was subsequently reduced. The termination film demonstrates near anatomic alignment of the fracture fragments. IMPRESSION: Reduction of olecranon fracture. Electronically Signed   By: Inez Catalina M.D.   On: 04/02/2018 16:17   Dg Hip Operative Unilat W Or W/o Pelvis Left  Result Date: 04/02/2018 CLINICAL DATA:  Left hip fracture repair FLUOROSCOPY TIME:  2 minutes and 45 seconds. Images: 7 EXAM: DG C-ARM 61-120 MIN COMPARISON:  None. FINDINGS: Three screws have been placed across the left hip fracture by the end of the study. IMPRESSION: Three screws have been placed across the left hip fracture by the end of the study. Electronically Signed   By: Dorise Bullion III M.D   On: 04/02/2018 16:24   Dg Hip Unilat W Or W/o Pelvis 2-3 Views Left  Result Date: 04/02/2018 CLINICAL DATA:  Fracture. EXAM: DG HIP (WITH OR WITHOUT PELVIS) 2-3V LEFT COMPARISON:  04/02/2018 FINDINGS: There are 3 Knowles pins identified within the left hip status post ORIF of impacted fracture of the left femoral neck. The hardware components and fracture fragments are in anatomic alignment. Left superior and inferior pubic rami fractures are again noted. IMPRESSION: 1. Status post ORIF of left femoral neck fracture. 2. Left pubic rami fractures. Electronically Signed   By: Kerby Moors M.D.   On: 04/02/2018 18:10   Dg Hip Unilat W Or Wo Pelvis 2-3 Views Left  Result Date: 04/01/2018 CLINICAL DATA:  Fall at home with left hip pain. EXAM: DG HIP (WITH OR WITHOUT PELVIS) 2-3V  LEFT COMPARISON:  None. FINDINGS: Impacted fracture of the left femoral neck with mild angulation. Femoral head remains seated. Minimally displaced fracture of the left superior pubic ramus and nondisplaced inferior ramus fracture are age indeterminate. Right femoral head is seated in the acetabulum. Pubic symphysis and sacroiliac joints are congruent. The bones are under mineralized. Multiple pelvic phleboliths. IMPRESSION: 1. Impacted fracture of the left femoral neck with angulation. 2. Left superior and inferior pubic ramus fractures are age indeterminate, but may be acute. Electronically Signed   By: Jeb Levering M.D.   On: 04/01/2018 23:35     Scheduled Meds: . cholecalciferol  1,000 Units Oral Daily  . enoxaparin (LOVENOX) injection  40 mg Subcutaneous Q24H  . multivitamin with minerals  1 tablet  Oral Daily   Continuous Infusions: .  ceFAZolin (ANCEF) IV 2 g (04/02/18 2105)  . lactated ringers 10 mL/hr at 04/02/18 1221    Marzetta Board, MD, PhD [facility] Pager [phone]  If 7PM-7AM, please contact night-coverage [email or web address] Password [email or web address] 04/03/2018, 1:10 PM

## 2018-04-03 NOTE — Clinical Social Work Note (Signed)
Clinical Social Work Assessment  Patient Details  Name: Lindsey Patel MRN: [id number] Date of Birth: 02/23/1927  Date of referral:  04/03/18               Reason for consult:  Facility Placement                Permission sought to share information with:  Guardian Permission granted to share information::  Yes, Verbal Permission Granted  Name::     Doctor, general practice::  SNF  Relationship::     Contact Information:     Housing/Transportation Living arrangements for the past 2 months:  [employer] of Information:  Patient, Adult Children Patient Interpreter Needed:  None Criminal Activity/Legal Involvement Pertinent to Current Situation/Hospitalization:  No - Comment as needed Significant Relationships:  Adult Children, [place], Other Family Members, Neighbor Lives with:  Self Do you feel safe going back to the place where you live?  No Need for family participation in patient care:  Yes (Comment)  Care giving concerns:  Pt from home alone and will need skilled nursing at time of discharge.  Social Worker assessment / plan:  CSW met with patient, daughter Mickel Baas and son Harrie Jeans at bedside to discuss the disposition. Pt is from home alone and was independent with ADL's prior to fall. Pt/family agreeable to SNF.  CSW explained the SNF process, placement and insurance process. Family gave permission to send to SNF's in [place], [place] and [place]. Family would prefer an area in between [place] and [place].   CSW will f/u for SNF offers and discuss with daughter Mickel Baas.  Employment status:  Retired Nurse, adult PT Recommendations:  [place] / Referral to community resources:  [place]  Patient/Family's Response to care:  Contractor.  Patient/Family's Understanding of and Emotional Response to Diagnosis, Current Treatment, and Prognosis:  Pt/family has good understanding of her impairment  and pt was in a positive emotional state with plan at discharge. Pt hopeful to return to her independence once she has completed rehabilitative therapies.  Emotional Assessment Appearance:  Appears stated age Attitude/Demeanor/Rapport:  (Cooperative) Affect (typically observed):  Accepting, Appropriate Orientation:  Oriented to Self, Oriented to Place, Oriented to  Time, Oriented to Situation Alcohol / Substance use:  Not Applicable Psych involvement (Current and /or in the community):  No (Comment)  Discharge Needs  Concerns to be addressed:  Discharge Planning Concerns Readmission within the last 30 days:  No Current discharge risk:  Dependent with Mobility, Physical Impairment, Lives alone Barriers to Discharge:  No Barriers Identified   Normajean Baxter, LCSW 04/03/2018, 4:13 PM

## 2018-04-03 NOTE — Plan of Care (Signed)
  Problem: Pain Managment: Goal: General experience of comfort will improve Outcome: Progressing   Problem: Safety: Goal: Ability to remain free from injury will improve Outcome: Progressing   Problem: Skin Integrity: Goal: Risk for impaired skin integrity will decrease Outcome: Progressing   Problem: Coping: Goal: Level of anxiety will decrease Outcome: Progressing   

## 2018-04-04 ENCOUNTER — Encounter (HOSPITAL_COMMUNITY): Payer: Self-pay | Admitting: Student

## 2018-04-04 LAB — BASIC METABOLIC PANEL
ANION GAP: 8 (ref 5–15)
BUN: 12 mg/dL (ref 6–20)
CALCIUM: 8.2 mg/dL — AB (ref 8.9–10.3)
CO2: 27 mmol/L (ref 22–32)
CREATININE: 0.47 mg/dL (ref 0.44–1.00)
Chloride: 104 mmol/L (ref 101–111)
GFR calc Af Amer: >60 mL/min (ref >60)
Glucose, Bld: 108 mg/dL — ABNORMAL HIGH (ref 65–99)
Potassium: 4 mmol/L (ref 3.5–5.1)
SODIUM: 139 mmol/L (ref 135–145)

## 2018-04-04 LAB — VITAMIN D 25 HYDROXY (VIT D DEFICIENCY, FRACTURES): VIT D 25 HYDROXY: 43.6 ng/mL (ref 30.0–100.0)

## 2018-04-04 LAB — CBC
HCT: 26.7 % — ABNORMAL LOW (ref 36.0–46.0)
HEMOGLOBIN: 8.7 g/dL — AB (ref 12.0–15.0)
MCH: 30.9 pg (ref 26.0–34.0)
MCHC: 32.6 g/dL (ref 30.0–36.0)
MCV: 94.7 fL (ref 78.0–100.0)
PLATELETS: 106 10*3/uL — AB (ref 150–400)
RBC: 2.82 MIL/uL — AB (ref 3.87–5.11)
RDW: 12.8 % (ref 11.5–15.5)
WBC: 14.4 10*3/uL — AB (ref 4.0–10.5)

## 2018-04-04 MED ORDER — SIMVASTATIN 40 MG PO TABS
40.0000 mg | ORAL_TABLET | Freq: Every day | ORAL | Status: DC
  Administered 2018-04-04 – 2018-04-05 (×2): 40 mg via ORAL
  Filled 2018-04-04 (×2): qty 1

## 2018-04-04 NOTE — Progress Notes (Signed)
PROGRESS NOTE  Lindsey Patel UTM:[id number] DOB: [birth year] DOA: 04/01/2018 PCP: Ann Held, DO   LOS: 2 days   Brief Narrative / Interim history: 84 year old female with history of CLL, hyperlipidemia, who presented to the hospital on 6/12 in the morning with hip fracture and elbow fracture on the left after falling in [employer] parking lot.  Orthopedic surgery was consulted and patient now is status post operative repair on 6/12  Assessment & Plan: Principal Problem:   Closed left hip fracture ([facility]) Active Problems:   Chronic lymphocytic leukemia ([facility])   Hyperlipidemia LDL goal <100   Closed fracture of left olecranon process   Pelvic fracture ([facility])   Closed displaced fracture of left femoral neck ([facility])   Left hip fracture/left elbow fracture -Orthopedic surgery consulted, patient is s/p operative repair on 6/12, underwent percutaneous fixation of left femoral neck and open reduction and repair of left olecranon fracture -DVT prophylaxis per orthopedic surgery  Acute blood loss amenia / thrombocytopenia  -post op, hemoglobin continues to trend down as well as platelets are continue to decrease -no need for transfusion but will need ongoing monitoring  Hyperlipidemia -Back on her statin  History of CLL -With chronic leukocytosis, this appears stable and will need follow-up as an outpatient -White count has improved and remains stable  Hyperglycemia -no history of DM   DVT prophylaxis: SCDs Code Status: Full code Family Communication: daughter at bedside Disposition Plan: SNF 1 to 2 days  Consultants:   Orthopedic surgery   Procedures:   None   Antimicrobials:  None    Subjective: - no chest pain, shortness of breath, no abdominal pain, nausea or vomiting.   Objective: Vitals:   04/03/18 1506 04/03/18 2053 04/04/18 0336 04/04/18 0637  BP: (!) 133/49 (!) 114/94 (!) 162/55 (!) 157/59  Pulse: 87 88 76 82  Resp:      Temp:  98 F (36.7 C)  98.9 F (37.2 C)   TempSrc:  Oral Oral   SpO2: 91% 91% 94%   Weight:      Height:        Intake/Output Summary (Last 24 hours) at 04/04/2018 1222 Last data filed at 04/04/2018 1010 Gross per 24 hour  Intake 1577 ml  Output 1000 ml  Net 577 ml   Filed Weights   04/02/18 1218  Weight: 56.2 kg (124 lb)    Examination:  Constitutional: No distress Eyes: No scleral icterus ENMT: Moist mixed membranes Respiratory: Clear to auscultation bilaterally without wheezing or crackles.  Normal respiratory effort Cardiovascular: Regular rate and rhythm, no murmurs.  No edema. Abdomen: Soft, nontender, nondistended, bowel sounds positive Skin: No rashes seen, dressing on hip looks CDI Neurologic: Nonfocal   Data Reviewed: I have independently reviewed following labs and imaging studies   CBC: Recent Labs  Lab 04/02/18 0051 04/02/18 0357 04/03/18 0609 04/04/18 0446  WBC 26.1* 24.4* 16.7* 14.4*  HGB 12.8 12.3 9.6* 8.7*  HCT 37.5 36.9 29.0* 26.7*  MCV 91.9 93.4 93.5 94.7  PLT 162 156 114* 568*   Basic Metabolic Panel: Recent Labs  Lab 04/02/18 0051 04/02/18 0357 04/04/18 0446  NA 139 137 139  K 4.4 4.3 4.0  CL 106 105 104  CO2 22 23 27   GLUCOSE 163* 162* 108*  BUN 13 12 12   CREATININE 0.50 0.58 0.47  CALCIUM 9.1 8.9 8.2*   GFR: Estimated Creatinine Clearance: 38.4 mL/min (by C-G formula based on SCr of 0.47 mg/dL). Liver Function Tests: No results for  input(s): AST, ALT, ALKPHOS, BILITOT, PROT, ALBUMIN in the last 168 hours. No results for input(s): LIPASE, AMYLASE in the last 168 hours. No results for input(s): AMMONIA in the last 168 hours. Coagulation Profile: No results for input(s): INR, PROTIME in the last 168 hours. Cardiac Enzymes: No results for input(s): CKTOTAL, CKMB, CKMBINDEX, TROPONINI in the last 168 hours. BNP (last 3 results) No results for input(s): PROBNP in the last 8760 hours. HbA1C: No results for input(s): HGBA1C in the last 72  hours. CBG: No results for input(s): GLUCAP in the last 168 hours. Lipid Profile: No results for input(s): CHOL, HDL, LDLCALC, TRIG, CHOLHDL, LDLDIRECT in the last 72 hours. Thyroid Function Tests: Recent Labs    04/03/18 0849  TSH 0.477   Anemia Panel: No results for input(s): VITAMINB12, FOLATE, FERRITIN, TIBC, IRON, RETICCTPCT in the last 72 hours. Urine analysis:    Component Value Date/Time   BILIRUBINUR neg 09/10/2016 1553   PROTEINUR neg 09/10/2016 1553   UROBILINOGEN 0.2 09/10/2016 1553   NITRITE neg 09/10/2016 1553   LEUKOCYTESUR large (3+) (A) 09/10/2016 1553   Sepsis Labs: Invalid input(s): PROCALCITONIN, LACTICIDVEN  Recent Results (from the past 240 hour(s))  MRSA PCR Screening     Status: None   Collection Time: 04/02/18 12:06 PM  Result Value Ref Range Status   MRSA by PCR NEGATIVE NEGATIVE Final    Comment:        The GeneXpert MRSA Assay (FDA approved for NASAL specimens only), is one component of a comprehensive MRSA colonization surveillance program. It is not intended to diagnose MRSA infection nor to guide or monitor treatment for MRSA infections. Performed at [facility], [place] [address] [place], [place] [address]       Radiology Studies: Dg Elbow 2 Views Left  Result Date: 04/02/2018 CLINICAL DATA:  Status post olecranon fracture repair. EXAM: LEFT ELBOW - 2 VIEW COMPARISON:  None. FINDINGS: The patient is now within a cast. The olecranon fracture has been partially reduced with improved alignment. IMPRESSION: Olecranon non fracture reduction as above. Electronically Signed   By: Dorise Bullion III M.D   On: 04/02/2018 18:10   Dg Elbow 2 Views Left  Result Date: 04/02/2018 CLINICAL DATA:  Olecranon fracture EXAM: LEFT ELBOW - 2 VIEW; DG C-ARM 61-120 MIN COMPARISON:  04/01/2018 FLUOROSCOPY TIME:  Fluoroscopy Time:  16 seconds Radiation Exposure Index (if provided by the fluoroscopic device): Not available Number of Acquired Spot  Images: 4 FINDINGS: The previously seen olecranon fracture is again identified. It was subsequently reduced. The termination film demonstrates near anatomic alignment of the fracture fragments. IMPRESSION: Reduction of olecranon fracture. Electronically Signed   By: Inez Catalina M.D.   On: 04/02/2018 16:17   Dg C-arm 1-60 Min  Result Date: 04/02/2018 CLINICAL DATA:  Left hip fracture repair FLUOROSCOPY TIME:  2 minutes and 45 seconds. Images: 7 EXAM: DG C-ARM 61-120 MIN COMPARISON:  None. FINDINGS: Three screws have been placed across the left hip fracture by the end of the study. IMPRESSION: Three screws have been placed across the left hip fracture by the end of the study. Electronically Signed   By: Dorise Bullion III M.D   On: 04/02/2018 16:24   Dg C-arm 1-60 Min  Result Date: 04/02/2018 CLINICAL DATA:  Olecranon fracture EXAM: LEFT ELBOW - 2 VIEW; DG C-ARM 61-120 MIN COMPARISON:  04/01/2018 FLUOROSCOPY TIME:  Fluoroscopy Time:  16 seconds Radiation Exposure Index (if provided by the fluoroscopic device): Not available Number of  Acquired Spot Images: 4 FINDINGS: The previously seen olecranon fracture is again identified. It was subsequently reduced. The termination film demonstrates near anatomic alignment of the fracture fragments. IMPRESSION: Reduction of olecranon fracture. Electronically Signed   By: Inez Catalina M.D.   On: 04/02/2018 16:17   Dg Hip Operative Unilat W Or W/o Pelvis Left  Result Date: 04/02/2018 CLINICAL DATA:  Left hip fracture repair FLUOROSCOPY TIME:  2 minutes and 45 seconds. Images: 7 EXAM: DG C-ARM 61-120 MIN COMPARISON:  None. FINDINGS: Three screws have been placed across the left hip fracture by the end of the study. IMPRESSION: Three screws have been placed across the left hip fracture by the end of the study. Electronically Signed   By: Dorise Bullion III M.D   On: 04/02/2018 16:24   Dg Hip Unilat W Or W/o Pelvis 2-3 Views Left  Result Date: 04/02/2018 CLINICAL  DATA:  Fracture. EXAM: DG HIP (WITH OR WITHOUT PELVIS) 2-3V LEFT COMPARISON:  04/02/2018 FINDINGS: There are 3 Knowles pins identified within the left hip status post ORIF of impacted fracture of the left femoral neck. The hardware components and fracture fragments are in anatomic alignment. Left superior and inferior pubic rami fractures are again noted. IMPRESSION: 1. Status post ORIF of left femoral neck fracture. 2. Left pubic rami fractures. Electronically Signed   By: Kerby Moors M.D.   On: 04/02/2018 18:10     Scheduled Meds: . cholecalciferol  1,000 Units Oral Daily  . enoxaparin (LOVENOX) injection  40 mg Subcutaneous Q24H  . multivitamin with minerals  1 tablet Oral Daily  . simvastatin  40 mg Oral q1800   Continuous Infusions: . lactated ringers Stopped (04/03/18 1800)    Marzetta Board, MD, PhD [facility] Pager [phone]  If 7PM-7AM, please contact night-coverage [email or web address] Password [email or web address] 04/04/2018, 12:22 PM

## 2018-04-04 NOTE — Care Management Important Message (Signed)
Important Message  Patient Details  Name: Lindsey Patel MRN: [id number] Date of Birth: 02/15/1927   Medicare Important Message Given:  Yes    Ollis Daudelin 04/04/2018, 1:11 PM

## 2018-04-04 NOTE — Progress Notes (Signed)
Orthopaedic Trauma Progress Note  S:Resting O:  Vitals:   04/04/18 1454 04/04/18 2005  BP: (!) 159/59 (!) 159/68  Pulse: 84 86  Resp: 20   Temp: 98.3 F (36.8 C) 99.4 F (37.4 C)  SpO2: 95% 93%   Gen: Sleeping LUE: splint in place, motor and senosry intact to median, radial and ulnar nerves LLE: Dressing clean, dry and intact  Labs:  Results for orders placed or performed during the hospital encounter of 04/01/18 (from the past 24 hour(s))  Basic metabolic panel     Status: Abnormal   Collection Time: 04/04/18  4:46 AM  Result Value Ref Range   Sodium 139 135 - 145 mmol/L   Potassium 4.0 3.5 - 5.1 mmol/L   Chloride 104 101 - 111 mmol/L   CO2 27 22 - 32 mmol/L   Glucose, Bld 108 (H) 65 - 99 mg/dL   BUN 12 6 - 20 mg/dL   Creatinine, Ser 0.47 0.44 - 1.00 mg/dL   Calcium 8.2 (L) 8.9 - 10.3 mg/dL   GFR calc non Af Amer >60 >60 mL/min   GFR calc Af Amer >60 >60 mL/min   Anion gap 8 5 - 15  CBC     Status: Abnormal   Collection Time: 04/04/18  4:46 AM  Result Value Ref Range   WBC 14.4 (H) 4.0 - 10.5 K/uL   RBC 2.82 (L) 3.87 - 5.11 MIL/uL   Hemoglobin 8.7 (L) 12.0 - 15.0 g/dL   HCT 26.7 (L) 36.0 - 46.0 %   MCV 94.7 78.0 - 100.0 fL   MCH 30.9 26.0 - 34.0 pg   MCHC 32.6 30.0 - 36.0 g/dL   RDW 12.8 11.5 - 15.5 %   Platelets 106 (L) 150 - 400 K/uL    Assessment: 84 year old female s/p fall  Injuries: 1. Left impacted femoral neck fracture s/p CRPP 2. Displaced olecranon fracture  Weightbearing: WBAT LLE, NWB LUE  Insicional and dressing care: Keep dressing intact for now  Orthopedic device(s):Splint to LUE  CV/Blood loss: Hgb stable   Pain management: Currently only on tramadol  VTE prophylaxis: Lovenox 40 mg and continue for 30 days postoperatively  ID: 24 hrs of postoperative ancef-completed  Foley/Lines: Remove foley  Impediments to Fracture Healing: Osteoporosis, would recommend outpatient workup by PCP  Start 1000 units vit D daily Vit D within normal  limits TSH normal  Dispo: SNF over weekend  Follow - up plan: 1-2 weeks for transition out of splint  Shona Needles, MD [facility] [phone] (phone)

## 2018-04-04 NOTE — Social Work (Signed)
CSW received call from [facility] at [facility] confirming that they have a private room. SNF indicated that they discussed placement with daughter at length and they accepted SNF bed.  CSW advised admission staff to obtain [employer].  CSW confirmed that they will be able to admit patient over the weekend if she is medically ready.  CSW then contacted daughter who indicated that they have accepted SNF bed at [facility] in [place].  CSW will advise doctor of same.  SNF called back and confirmed that they have Insurance Auth for [facility] placement. The weekend admission staff is Dellis Filbert.  CSW will f/u for disposition.  Elissa Hefty, LCSW Clinical Social Worker [phone]

## 2018-04-04 NOTE — Social Work (Signed)
CSW f/u on disposition.  CSW spoke with daughter and she indicated that they are interested in [facility] in [place] and wanted to confirm that it is a private room. CSW then called admission staff, [place] at [facility]. She was unavailable and CSW will f/u to confirm room type and have them initiate Insurance Auth.  CSW will f/u.  Elissa Hefty, LCSW Clinical Social Worker [phone]

## 2018-04-04 NOTE — Social Work (Addendum)
CSW f/u for disposition.  CSW spoke with daughter Mickel Baas about SNF offers and provided SNF list. CSW advised her that [facility] in [place] is reviewing clinicals and will f/u with CSW if they can make a bed offer.  CSW reiterated that Insurance Josem Kaufmann will need to be obtained today for a possible weekend dc and CSW encouraged daughter to select SNF expeditiously so that we can be prepared.  CSW will f/u for disposition.  10:15am CSW received a call back from [place] [facility] in [place] advising that they can offer a SNF bed.  CSW then called daughter Mickel Baas at [phone] and advised that [facility] in [place] can offer a SNF bed.   CSW will f/u for SNF selection.  Elissa Hefty, LCSW Clinical Social Worker [phone]

## 2018-04-05 LAB — BASIC METABOLIC PANEL
Anion gap: 5 (ref 5–15)
BUN: 7 mg/dL (ref 6–20)
CALCIUM: 8 mg/dL — AB (ref 8.9–10.3)
CO2: 28 mmol/L (ref 22–32)
CREATININE: 0.4 mg/dL — AB (ref 0.44–1.00)
Chloride: 104 mmol/L (ref 101–111)
GFR calc Af Amer: >60 mL/min (ref >60)
Glucose, Bld: 99 mg/dL (ref 65–99)
POTASSIUM: 3.9 mmol/L (ref 3.5–5.1)
SODIUM: 137 mmol/L (ref 135–145)

## 2018-04-05 LAB — CBC
HCT: 26.9 % — ABNORMAL LOW (ref 36.0–46.0)
Hemoglobin: 8.6 g/dL — ABNORMAL LOW (ref 12.0–15.0)
MCH: 30.4 pg (ref 26.0–34.0)
MCHC: 32 g/dL (ref 30.0–36.0)
MCV: 95.1 fL (ref 78.0–100.0)
PLATELETS: 120 10*3/uL — AB (ref 150–400)
RBC: 2.83 MIL/uL — AB (ref 3.87–5.11)
RDW: 12.7 % (ref 11.5–15.5)
WBC: 13.6 10*3/uL — AB (ref 4.0–10.5)

## 2018-04-05 NOTE — Progress Notes (Signed)
PROGRESS NOTE  Lindsey Patel:[id number] DOB: 06/15/1927 DOA: 04/01/2018 PCP: Ann Held, DO   LOS: 3 days   Brief Narrative / Interim history: 84 year old female with history of CLL, hyperlipidemia, who presented to the hospital on 6/12 in the morning with hip fracture and elbow fracture on the left after falling in [employer] parking lot.  Orthopedic surgery was consulted and patient now is status post operative repair on 6/12  Assessment & Plan: Principal Problem:   Closed left hip fracture (HCC) Active Problems:   Chronic lymphocytic leukemia (HCC)   Hyperlipidemia LDL goal <100   Closed fracture of left olecranon process   Pelvic fracture (HCC)   Closed displaced fracture of left femoral neck (HCC)   Left hip fracture/left elbow fracture -Orthopedic surgery consulted, patient is s/p operative repair on 6/12, underwent percutaneous fixation of left femoral neck and open reduction and repair of left olecranon fracture -DVT prophylaxis per orthopedic surgery to continue Lovenox for a month  Acute blood loss amenia / thrombocytopenia  -post op, hemoglobin continues to trend down as well as platelets are continue to decrease -no need for transfusion but will need ongoing monitoring, hemoglobin slightly lower today but overall it appears that it is stabilizing, will repeat in the morning prior to discharge  Hyperlipidemia -Back on her statin  History of CLL -With chronic leukocytosis, this appears stable and will need follow-up as an outpatient -White count has improved and remains stable  Hyperglycemia -no history of DM   DVT prophylaxis: SCDs Code Status: Full code Family Communication: daughter at bedside Disposition Plan: SNF tomorrow pending CBC stability  Consultants:   Orthopedic surgery   Procedures:   None   Antimicrobials:  None    Subjective: - no chest pain, shortness of breath, no abdominal pain, nausea or vomiting.    Objective: Vitals:   04/04/18 0637 04/04/18 1454 04/04/18 2005 04/05/18 0350  BP: (!) 157/59 (!) 159/59 (!) 159/68 (!) 153/63  Pulse: 82 84 86 81  Resp:  20    Temp:  98.3 F (36.8 C) 99.4 F (37.4 C) 98.7 F (37.1 C)  TempSrc:  Oral Oral Oral  SpO2:  95% 93% 92%  Weight:      Height:        Intake/Output Summary (Last 24 hours) at 04/05/2018 1201 Last data filed at 04/05/2018 0356 Gross per 24 hour  Intake -  Output 700 ml  Net -700 ml   Filed Weights   04/02/18 1218  Weight: 56.2 kg (124 lb)    Examination:  Constitutional: NAD Respiratory: CTA Cardiovascular: RRR   Data Reviewed: I have independently reviewed following labs and imaging studies   CBC: Recent Labs  Lab 04/02/18 0051 04/02/18 0357 04/03/18 0609 04/04/18 0446 04/05/18 0548  WBC 26.1* 24.4* 16.7* 14.4* 13.6*  HGB 12.8 12.3 9.6* 8.7* 8.6*  HCT 37.5 36.9 29.0* 26.7* 26.9*  MCV 91.9 93.4 93.5 94.7 95.1  PLT 162 156 114* 106* 376*   Basic Metabolic Panel: Recent Labs  Lab 04/02/18 0051 04/02/18 0357 04/04/18 0446 04/05/18 0548  NA 139 137 139 137  K 4.4 4.3 4.0 3.9  CL 106 105 104 104  CO2 22 23 27 28   GLUCOSE 163* 162* 108* 99  BUN 13 12 12 7   CREATININE 0.50 0.58 0.47 0.40*  CALCIUM 9.1 8.9 8.2* 8.0*   GFR: Estimated Creatinine Clearance: 38.4 mL/min (A) (by C-G formula based on SCr of 0.4 mg/dL (L)). Liver Function Tests: No results  for input(s): AST, ALT, ALKPHOS, BILITOT, PROT, ALBUMIN in the last 168 hours. No results for input(s): LIPASE, AMYLASE in the last 168 hours. No results for input(s): AMMONIA in the last 168 hours. Coagulation Profile: No results for input(s): INR, PROTIME in the last 168 hours. Cardiac Enzymes: No results for input(s): CKTOTAL, CKMB, CKMBINDEX, TROPONINI in the last 168 hours. BNP (last 3 results) No results for input(s): PROBNP in the last 8760 hours. HbA1C: No results for input(s): HGBA1C in the last 72 hours. CBG: No results for  input(s): GLUCAP in the last 168 hours. Lipid Profile: No results for input(s): CHOL, HDL, LDLCALC, TRIG, CHOLHDL, LDLDIRECT in the last 72 hours. Thyroid Function Tests: Recent Labs    04/03/18 0849  TSH 0.477   Anemia Panel: No results for input(s): VITAMINB12, FOLATE, FERRITIN, TIBC, IRON, RETICCTPCT in the last 72 hours. Urine analysis:    Component Value Date/Time   BILIRUBINUR neg 09/10/2016 1553   PROTEINUR neg 09/10/2016 1553   UROBILINOGEN 0.2 09/10/2016 1553   NITRITE neg 09/10/2016 1553   LEUKOCYTESUR large (3+) (A) 09/10/2016 1553   Sepsis Labs: Invalid input(s): PROCALCITONIN, LACTICIDVEN  Recent Results (from the past 240 hour(s))  MRSA PCR Screening     Status: None   Collection Time: 04/02/18 12:06 PM  Result Value Ref Range Status   MRSA by PCR NEGATIVE NEGATIVE Final    Comment:        The GeneXpert MRSA Assay (FDA approved for NASAL specimens only), is one component of a comprehensive MRSA colonization surveillance program. It is not intended to diagnose MRSA infection nor to guide or monitor treatment for MRSA infections. Performed at [facility], [place] [address], [place], [place] [address]       Radiology Studies: No results found.   Scheduled Meds: . cholecalciferol  1,000 Units Oral Daily  . enoxaparin (LOVENOX) injection  40 mg Subcutaneous Q24H  . multivitamin with minerals  1 tablet Oral Daily  . simvastatin  40 mg Oral q1800   Continuous Infusions: . lactated ringers Stopped (04/03/18 1800)    Marzetta Board, MD, PhD [facility] Pager [phone]  If 7PM-7AM, please contact night-coverage [email or web address] Password [email or web address] 04/05/2018, 12:01 PM

## 2018-04-06 DIAGNOSIS — S72002D Fracture of unspecified part of neck of left femur, subsequent encounter for closed fracture with routine healing: Secondary | ICD-10-CM

## 2018-04-06 DIAGNOSIS — E785 Hyperlipidemia, unspecified: Secondary | ICD-10-CM

## 2018-04-06 LAB — BASIC METABOLIC PANEL
Anion gap: 5 (ref 5–15)
BUN: 8 mg/dL (ref 6–20)
CALCIUM: 7.9 mg/dL — AB (ref 8.9–10.3)
CO2: 27 mmol/L (ref 22–32)
CREATININE: 0.4 mg/dL — AB (ref 0.44–1.00)
Chloride: 106 mmol/L (ref 101–111)
GFR calc non Af Amer: >60 mL/min (ref >60)
Glucose, Bld: 99 mg/dL (ref 65–99)
Potassium: 3.5 mmol/L (ref 3.5–5.1)
Sodium: 138 mmol/L (ref 135–145)

## 2018-04-06 LAB — CBC
HEMATOCRIT: 26.2 % — AB (ref 36.0–46.0)
Hemoglobin: 8.5 g/dL — ABNORMAL LOW (ref 12.0–15.0)
MCH: 30.9 pg (ref 26.0–34.0)
MCHC: 32.4 g/dL (ref 30.0–36.0)
MCV: 95.3 fL (ref 78.0–100.0)
Platelets: 154 10*3/uL (ref 150–400)
RBC: 2.75 MIL/uL — ABNORMAL LOW (ref 3.87–5.11)
RDW: 12.7 % (ref 11.5–15.5)
WBC: 14.4 10*3/uL — ABNORMAL HIGH (ref 4.0–10.5)

## 2018-04-06 MED ORDER — HYDROCODONE-ACETAMINOPHEN 5-325 MG PO TABS: 1.0000 | ORAL_TABLET | Freq: Four times a day (QID) | ORAL | 0 refills | Status: DC | PRN

## 2018-04-06 MED ORDER — TRAMADOL HCL 50 MG PO TABS: 25.0000 mg | ORAL_TABLET | Freq: Four times a day (QID) | ORAL | 0 refills | Status: DC | PRN

## 2018-04-06 MED ORDER — ENOXAPARIN SODIUM 40 MG/0.4ML ~~LOC~~ SOLN: 40.0000 mg | SUBCUTANEOUS | Status: DC

## 2018-04-06 NOTE — Clinical Social Work Placement (Addendum)
   CLINICAL SOCIAL WORK PLACEMENT  NOTE  Date:  04/06/2018  Patient Details  Name: Lindsey Patel MRN: [id number] Date of Birth: 1927-03-31  Clinical Social Work is seeking post-discharge placement for this patient at the [place] level of care (*CSW will initial, date and re-position this form in  chart as items are completed):  Yes   Patient/family provided with [facility] Work Department's list of facilities offering this level of care within the geographic area requested by the patient (or if unable, by the patient's family).  Yes   Patient/family informed of their freedom to choose among providers that offer the needed level of care, that participate in Medicare, Medicaid or managed care program needed by the patient, have an available bed and are willing to accept the patient.  Yes   Patient/family informed of [facility]'s ownership interest in [facility] and [facility], as well as of the fact that they are under no obligation to receive care at these facilities.  PASRR submitted to EDS on       PASRR number received on 04/03/18     Existing PASRR number confirmed on       FL2 transmitted to all facilities in geographic area requested by pt/family on 04/03/18     FL2 transmitted to all facilities within larger geographic area on       Patient informed that his/her managed care company has contracts with or will negotiate with certain facilities, including the following:        Yes   Patient/family informed of bed offers received.  Patient chooses bed at [facility]     Physician recommends and patient chooses bed at      Patient to be transferred to [facility] on 04/06/18.  Patient to be transferred to facility by [facility]     Patient family notified on 04/06/18 of transfer.  Name of family member notified:  Mickel Baas, daughter at bedside   PHYSICIAN       Additional Comment:     _______________________________________________ Eileen Stanford, LCSW 04/06/2018, 9:49 AM

## 2018-04-06 NOTE — Progress Notes (Signed)
Report called and given to [facility] at [facility]. Answered all questions to satisfaction. Daughter in room with patient and aware of transport. Pt in no distress at time of discharge.

## 2018-04-06 NOTE — Plan of Care (Signed)
  Problem: Activity: Goal: Risk for activity intolerance will decrease Outcome: Adequate for Discharge   Problem: Elimination: Goal: Will not experience complications related to bowel motility Outcome: Completed/Met   Problem: Pain Managment: Goal: General experience of comfort will improve Outcome: Completed/Met

## 2018-04-06 NOTE — Discharge Summary (Signed)
Physician Discharge Summary  Lindsey Patel ATF:[id number] DOB: [birth year] DOA: 04/01/2018  PCP: Ann Held, DO  Admit date: 04/01/2018 Discharge date: 04/06/2018  Admitted From: home Disposition:  SNF  Recommendations for Outpatient Follow-up:  Follow up with Dr. Doreatha Martin in 1 week Continue Lovenox for DVT prophylaxis   Ortho recommendations Injuries: 1. Left impacted femoral neck fracture s/p CRPP 2. Displaced olecranon fracture                  Weightbearing: WBAT LLE, NWB LUE                  Insicional and dressing care: Keep dressing intact for now                  Orthopedic device(s):Splint to LUE  CV/Blood loss: Hgb stable   Pain management: Currently only on tramadol  VTE prophylaxis: Lovenox 40 mg and continue for 30 days postoperatively  Follow - up plan: 1-2 weeks for transition out of splint  Home Health: none Equipment/Devices: none  Discharge Condition: stable CODE STATUS: Full code Diet recommendation: regular  HPI: Per Dr. Glyn Ade, Lindsey Patel is a 84 y.o. female with history of CLL, hyperlipidemia had a fall while patient was walking after visiting a restaurant.  Patient slipped over the curb and fell.  Following which patient hurt her left elbow and the left lower extremity.  Denies hitting her head or losing consciousness.  Denies any chest pain or shortness of breath. ED Course: In the ER x-rays revealed left hip fracture left elbow fracture and possible left sided pelvic fracture.  Dr. Mardelle Matte on-call orthopedic surgeon has been consulted patient admitted for possible surgery.  Hospital Course: Left hip fracture/left elbow fracture -Orthopedic surgery consulted, patient is s/p operative repair on 6/12, underwent percutaneous fixation of left femoral neck and open reduction and repair of left olecranon fracture. She recovered well postoperatively, able to work with PT and will be discharged in stable condition to SNF level of care for  ongoing rehab. She was advised to follow up with Dr. Doreatha Martin with orthopedics in 1-2 weeks. Continue Lovenox for DVT prophylaxis.  Acute blood loss amenia / thrombocytopenia  -post op, hemoglobin decreased initially but now has remained stable. No bleeding. Thrombocytopenia was likely consumptive, platelets have now normalized.  Hyperlipidemia -Back on her statin History of CLL -With chronic leukocytosis, this appears stable and will need follow-up as an outpatient   Discharge Diagnoses:  Principal Problem:   Closed left hip fracture [facility]) Active Problems:   Chronic lymphocytic leukemia (HCC)   Hyperlipidemia LDL goal <100   Closed fracture of left olecranon process   Pelvic fracture (HCC)   Closed displaced fracture of left femoral neck (HCC)     Discharge Instructions   Allergies as of 04/06/2018      Reactions   Pneumococcal Vaccines Other (See Comments)   Had a local skin reaction      Medication List    TAKE these medications   aspirin EC 81 MG tablet Take 81 mg by mouth daily.   calcium carbonate 600 MG Tabs tablet Commonly known as:  OS-CAL Take 600 mg by mouth 2 (two) times daily with a meal.   enoxaparin 40 MG/0.4ML injection Commonly known as:  LOVENOX Inject 0.4 mLs (40 mg total) into the skin daily.   fish oil-omega-3 fatty acids 1000 MG capsule Take 2 g by mouth 2 (two) times daily.   FLAXSEED OIL PO Take  1,400 mg by mouth daily.   HYDROcodone-acetaminophen 5-325 MG tablet Commonly known as:  NORCO/VICODIN Take 1 tablet by mouth every 6 (six) hours as needed for severe pain.   multivitamin tablet Take 1 tablet by mouth daily.   simvastatin 40 MG tablet Commonly known as:  ZOCOR TAKE 1 TABLET BY MOUTH EVERY DAY AT 6 PM   traMADol 50 MG tablet Commonly known as:  ULTRAM Take 0.5 tablets (25 mg total) by mouth every 6 (six) hours as needed for moderate pain.      Follow-up Information    Haddix, Thomasene Lot, MD. Schedule an appointment as soon as  possible for a visit in 1 week(s).   Specialty:  Orthopedic Surgery Contact information: [address] [place] [address] [phone]           Consultations:  Orthopedic surgery   Procedures/Studies:  percutaneous fixation of left femoral neck and open reduction and repair of left olecranon fracture by Dr. Doreatha Martin 6/12  Dg Chest 1 View  Result Date: 04/01/2018 CLINICAL DATA:  Fall at home with left hip and elbow fractures. EXAM: CHEST  1 VIEW COMPARISON:  None. FINDINGS: The cardiomediastinal contours are normal. The lungs are clear. Pulmonary vasculature is normal. No consolidation, pleural effusion, or pneumothorax. No acute osseous abnormalities are seen. The bones are under mineralized. IMPRESSION: No acute chest findings. Electronically Signed   By: Jeb Levering M.D.   On: 04/01/2018 23:39   Dg Elbow 2 Views Left  Result Date: 04/02/2018 CLINICAL DATA:  Status post olecranon fracture repair. EXAM: LEFT ELBOW - 2 VIEW COMPARISON:  None. FINDINGS: The patient is now within a cast. The olecranon fracture has been partially reduced with improved alignment. IMPRESSION: Olecranon non fracture reduction as above. Electronically Signed   By: Dorise Bullion III M.D   On: 04/02/2018 18:10   Dg Elbow 2 Views Left  Result Date: 04/02/2018 CLINICAL DATA:  Olecranon fracture EXAM: LEFT ELBOW - 2 VIEW; DG C-ARM 61-120 MIN COMPARISON:  04/01/2018 FLUOROSCOPY TIME:  Fluoroscopy Time:  16 seconds Radiation Exposure Index (if provided by the fluoroscopic device): Not available Number of Acquired Spot Images: 4 FINDINGS: The previously seen olecranon fracture is again identified. It was subsequently reduced. The termination film demonstrates near anatomic alignment of the fracture fragments. IMPRESSION: Reduction of olecranon fracture. Electronically Signed   By: Inez Catalina M.D.   On: 04/02/2018 16:17   Dg Elbow Complete Left  Result Date: 04/01/2018 CLINICAL DATA:  Left elbow  pain and bleeding after fall at home. Pain about the olecranon. EXAM: LEFT ELBOW - COMPLETE 3+ VIEW COMPARISON:  None. FINDINGS: Mid olecranon fracture with distraction of approximately 16 mm at the articular surface and 13 mm of the posterior cortex. Fracture is mildly comminuted. Mild retraction of the proximal fragment. Associated joint effusion and large amount of soft tissue edema. The radius and distal humerus appear intact. IMPRESSION: Significantly displaced and mildly comminuted mid olecranon fracture with articular involvement. Electronically Signed   By: Jeb Levering M.D.   On: 04/01/2018 23:38   Dg C-arm 1-60 Min  Result Date: 04/02/2018 CLINICAL DATA:  Left hip fracture repair FLUOROSCOPY TIME:  2 minutes and 45 seconds. Images: 7 EXAM: DG C-ARM 61-120 MIN COMPARISON:  None. FINDINGS: Three screws have been placed across the left hip fracture by the end of the study. IMPRESSION: Three screws have been placed across the left hip fracture by the end of the study. Electronically Signed  By: Dorise Bullion III M.D   On: 04/02/2018 16:24   Dg C-arm 1-60 Min  Result Date: 04/02/2018 CLINICAL DATA:  Olecranon fracture EXAM: LEFT ELBOW - 2 VIEW; DG C-ARM 61-120 MIN COMPARISON:  04/01/2018 FLUOROSCOPY TIME:  Fluoroscopy Time:  16 seconds Radiation Exposure Index (if provided by the fluoroscopic device): Not available Number of Acquired Spot Images: 4 FINDINGS: The previously seen olecranon fracture is again identified. It was subsequently reduced. The termination film demonstrates near anatomic alignment of the fracture fragments. IMPRESSION: Reduction of olecranon fracture. Electronically Signed   By: Inez Catalina M.D.   On: 04/02/2018 16:17   Dg Hip Operative Unilat W Or W/o Pelvis Left  Result Date: 04/02/2018 CLINICAL DATA:  Left hip fracture repair FLUOROSCOPY TIME:  2 minutes and 45 seconds. Images: 7 EXAM: DG C-ARM 61-120 MIN COMPARISON:  None. FINDINGS: Three screws have been placed  across the left hip fracture by the end of the study. IMPRESSION: Three screws have been placed across the left hip fracture by the end of the study. Electronically Signed   By: Dorise Bullion III M.D   On: 04/02/2018 16:24   Dg Hip Unilat W Or W/o Pelvis 2-3 Views Left  Result Date: 04/02/2018 CLINICAL DATA:  Fracture. EXAM: DG HIP (WITH OR WITHOUT PELVIS) 2-3V LEFT COMPARISON:  04/02/2018 FINDINGS: There are 3 Knowles pins identified within the left hip status post ORIF of impacted fracture of the left femoral neck. The hardware components and fracture fragments are in anatomic alignment. Left superior and inferior pubic rami fractures are again noted. IMPRESSION: 1. Status post ORIF of left femoral neck fracture. 2. Left pubic rami fractures. Electronically Signed   By: Kerby Moors M.D.   On: 04/02/2018 18:10   Dg Hip Unilat W Or Wo Pelvis 2-3 Views Left  Result Date: 04/01/2018 CLINICAL DATA:  Fall at home with left hip pain. EXAM: DG HIP (WITH OR WITHOUT PELVIS) 2-3V LEFT COMPARISON:  None. FINDINGS: Impacted fracture of the left femoral neck with mild angulation. Femoral head remains seated. Minimally displaced fracture of the left superior pubic ramus and nondisplaced inferior ramus fracture are age indeterminate. Right femoral head is seated in the acetabulum. Pubic symphysis and sacroiliac joints are congruent. The bones are under mineralized. Multiple pelvic phleboliths. IMPRESSION: 1. Impacted fracture of the left femoral neck with angulation. 2. Left superior and inferior pubic ramus fractures are age indeterminate, but may be acute. Electronically Signed   By: Jeb Levering M.D.   On: 04/01/2018 23:35     Subjective: - no chest pain, shortness of breath, no abdominal pain, nausea or vomiting.   Discharge Exam: Vitals:   04/06/18 0100 04/06/18 0431  BP:  (!) 155/62  Pulse:  75  Resp: 18   Temp:  98.1 F (36.7 C)  SpO2:  92%    General: Pt is alert, awake, not in acute  distress Cardiovascular: RRR, S1/S2 +, no rubs, no gallops Respiratory: CTA bilaterally, no wheezing, no rhonchi Abdominal: Soft, NT, ND, bowel sounds + Extremities: no edema, no cyanosis    The results of significant diagnostics from this hospitalization (including imaging, microbiology, ancillary and laboratory) are listed below for reference.     Microbiology: Recent Results (from the past 240 hour(s))  MRSA PCR Screening     Status: None   Collection Time: 04/02/18 12:06 PM  Result Value Ref Range Status   MRSA by PCR NEGATIVE NEGATIVE Final    Comment:  The GeneXpert MRSA Assay (FDA approved for NASAL specimens only), is one component of a comprehensive MRSA colonization surveillance program. It is not intended to diagnose MRSA infection nor to guide or monitor treatment for MRSA infections. Performed at [facility], [place] [address], [place], [place] [address]      Labs: BNP (last 3 results) No results for input(s): BNP in the last 8760 hours. Basic Metabolic Panel: Recent Labs  Lab 04/02/18 0051 04/02/18 0357 04/04/18 0446 04/05/18 0548 04/06/18 0427  NA 139 137 139 137 138  K 4.4 4.3 4.0 3.9 3.5  CL 106 105 104 104 106  CO2 22 23 27 28 27   GLUCOSE 163* 162* 108* 99 99  BUN 13 12 12 7 8   CREATININE 0.50 0.58 0.47 0.40* 0.40*  CALCIUM 9.1 8.9 8.2* 8.0* 7.9*   Liver Function Tests: No results for input(s): AST, ALT, ALKPHOS, BILITOT, PROT, ALBUMIN in the last 168 hours. No results for input(s): LIPASE, AMYLASE in the last 168 hours. No results for input(s): AMMONIA in the last 168 hours. CBC: Recent Labs  Lab 04/02/18 0357 04/03/18 0609 04/04/18 0446 04/05/18 0548 04/06/18 0427  WBC 24.4* 16.7* 14.4* 13.6* 14.4*  HGB 12.3 9.6* 8.7* 8.6* 8.5*  HCT 36.9 29.0* 26.7* 26.9* 26.2*  MCV 93.4 93.5 94.7 95.1 95.3  PLT 156 114* 106* 120* 154   Cardiac Enzymes: No results for input(s): CKTOTAL, CKMB, CKMBINDEX, TROPONINI in the last 168  hours. BNP: Invalid input(s): POCBNP CBG: No results for input(s): GLUCAP in the last 168 hours. D-Dimer No results for input(s): DDIMER in the last 72 hours. Hgb A1c No results for input(s): HGBA1C in the last 72 hours. Lipid Profile No results for input(s): CHOL, HDL, LDLCALC, TRIG, CHOLHDL, LDLDIRECT in the last 72 hours. Thyroid function studies No results for input(s): TSH, T4TOTAL, T3FREE, THYROIDAB in the last 72 hours.  Invalid input(s): FREET3 Anemia work up No results for input(s): VITAMINB12, FOLATE, FERRITIN, TIBC, IRON, RETICCTPCT in the last 72 hours. Urinalysis    Component Value Date/Time   BILIRUBINUR neg 09/10/2016 1553   PROTEINUR neg 09/10/2016 1553   UROBILINOGEN 0.2 09/10/2016 1553   NITRITE neg 09/10/2016 1553   LEUKOCYTESUR large (3+) (A) 09/10/2016 1553   Sepsis Labs Invalid input(s): PROCALCITONIN,  WBC,  LACTICIDVEN   Time coordinating discharge: 35 minutes  SIGNED:  Marzetta Board, MD  [facility] 04/06/2018, 9:41 AM Pager [phone]  If 7PM-7AM, please contact night-coverage [email or web address] Password [email or web address]

## 2018-04-06 NOTE — Discharge Instructions (Signed)
Follow with Dr. Doreatha Martin in 1 week  Please get a complete blood count and chemistry panel checked by your Primary MD at your next visit, and again as instructed by your Primary MD. Please get your medications reviewed and adjusted by your Primary MD.  Please request your Primary MD to go over all Hospital Tests and Procedure/Radiological results at the follow up, please get all Hospital records sent to your Prim MD by signing hospital release before you go home.  If you had Pneumonia of Lung problems at the Hospital: Please get a 2 view Chest X ray done in 6-8 weeks after hospital discharge or sooner if instructed by your Primary MD.  If you have Congestive Heart Failure: Please call your Cardiologist or Primary MD anytime you have any of the following symptoms:  1) 3 pound weight gain in 24 hours or 5 pounds in 1 week  2) shortness of breath, with or without a dry hacking cough  3) swelling in the hands, feet or stomach  4) if you have to sleep on extra pillows at night in order to breathe  Follow cardiac low salt diet and 1.5 lit/day fluid restriction.  If you have diabetes Accuchecks 4 times/day, Once in AM empty stomach and then before each meal. Log in all results and show them to your primary doctor at your next visit. If any glucose reading is under 80 or above 300 call your primary MD immediately.  If you have Seizure/Convulsions/Epilepsy: Please do not drive, operate heavy machinery, participate in activities at heights or participate in high speed sports until you have seen by Primary MD or a Neurologist and advised to do so again.  If you had Gastrointestinal Bleeding: Please ask your Primary MD to check a complete blood count within one week of discharge or at your next visit. Your endoscopic/colonoscopic biopsies that are pending at the time of discharge, will also need to followed by your Primary MD.  Get Medicines reviewed and adjusted. Please take all your medications with  you for your next visit with your Primary MD  Please request your Primary MD to go over all hospital tests and procedure/radiological results at the follow up, please ask your Primary MD to get all Hospital records sent to his/her office.  If you experience worsening of your admission symptoms, develop shortness of breath, life threatening emergency, suicidal or homicidal thoughts you must seek medical attention immediately by calling 911 or calling your MD immediately  if symptoms less severe.  You must read complete instructions/literature along with all the possible adverse reactions/side effects for all the Medicines you take and that have been prescribed to you. Take any new Medicines after you have completely understood and accpet all the possible adverse reactions/side effects.   Do not drive or operate heavy machinery when taking Pain medications.   Do not take more than prescribed Pain, Sleep and Anxiety Medications  Special Instructions: If you have smoked or chewed Tobacco  in the last 2 yrs please stop smoking, stop any regular Alcohol  and or any Recreational drug use.  Wear Seat belts while driving.  Please note You were cared for by a hospitalist during your hospital stay. If you have any questions about your discharge medications or the care you received while you were in the hospital after you are discharged, you can call the unit and asked to speak with the hospitalist on call if the hospitalist that took care of you is not available. Once you  are discharged, your primary care physician will handle any further medical issues. Please note that NO REFILLS for any discharge medications will be authorized once you are discharged, as it is imperative that you return to your primary care physician (or establish a relationship with a primary care physician if you do not have one) for your aftercare needs so that they can reassess your need for medications and monitor your lab  values.  You can reach the hospitalist office at phone [phone] or fax [phone]   If you do not have a primary care physician, you can call [phone] for a physician referral.  Activity: As tolerated with Full fall precautions use walker/cane & assistance as needed  Diet: regular  Disposition Home

## 2018-04-06 NOTE — Clinical Social Work Note (Signed)
Clinical Social Worker facilitated patient discharge including contacting patient family and facility to confirm patient discharge plans.  Clinical information faxed to facility and family agreeable with plan.  CSW arranged ambulance transport via [facility] to [facility] .  RN to call [phone] for report prior to discharge.  Clinical Social Worker will sign off for now as social work intervention is no longer needed. Please consult [place] again if new need arises.  Mahamad Jabbi LCSWA [phone]

## 2018-04-22 ENCOUNTER — Other Ambulatory Visit: Payer: Self-pay | Admitting: Family Medicine

## 2018-05-07 ENCOUNTER — Telehealth: Payer: Self-pay

## 2018-05-07 NOTE — Telephone Encounter (Signed)
Copied from [place] [phone]. Topic: Quick Communication - See Telephone Encounter >> May 06, 2018  3:09 PM Hewitt Shorts wrote: Collier Salina with [facility]  is calling to get verbal orders for PT 2 weeks for 4 weeks and 1 week for 2 weeks   Best number for Collier Salina is [phone]  >> May 07, 2018  8:53 AM Keene Breath wrote: Anne with [facility] called to get verbal orders for OT - Arm exercise and Home safety 2 x wk for 3 wks 1 x wk for 3 wks

## 2018-05-08 ENCOUNTER — Ambulatory Visit: Payer: Medicare Other | Admitting: Family Medicine

## 2018-05-08 ENCOUNTER — Encounter: Payer: Self-pay | Admitting: Family Medicine

## 2018-05-08 VITALS — BP 120/78 | HR 80 | Temp 98.6°F | Resp 16 | Ht 62.0 in | Wt 119.2 lb

## 2018-05-08 DIAGNOSIS — R413 Other amnesia: Secondary | ICD-10-CM

## 2018-05-08 DIAGNOSIS — Z8781 Personal history of (healed) traumatic fracture: Secondary | ICD-10-CM | POA: Diagnosis not present

## 2018-05-08 NOTE — Patient Instructions (Signed)
Hip Fracture A hip fracture is a fracture of the upper part of your thigh bone (femur). What are the causes? A hip fracture is caused by a direct blow to the side of your hip. This is usually the result of a fall but can occur in other circumstances, such as an automobile accident. What increases the risk? There is an increased risk of hip fractures in people with:  An unsteady walking pattern (gait) and those with conditions that contribute to poor balance, such as Parkinson's disease or dementia.  Osteopenia and osteoporosis.  Cancer that spreads to the leg bones.  Certain metabolic diseases.  What are the signs or symptoms? Symptoms of hip fracture include:  Pain over the injured hip.  Inability to put weight on the leg in which the fracture occurred (although, some patients are able to walk after a hip fracture).  Toes and foot of the affected leg point outward when you lie down.  How is this diagnosed? A physical exam can determine if a hip fracture is likely to have occurred. X-ray exams are needed to confirm the fracture and to look for other injuries. The X-ray exam can help to determine the type of hip fracture. Rarely, the fracture is not visible on an X-ray image and a CT scan or MRI will have to be done. How is this treated? The treatment for a fracture is usually surgery. This means using a screw, nail, or rod to hold the bones in place. Follow these instructions at home: Take all medicines as directed by your health care provider. Contact a health care provider if: Pain continues, even after taking pain medicine. This information is not intended to replace advice given to you by your health care provider. Make sure you discuss any questions you have with your health care provider. Document Released: 10/08/2005 Document Revised: 03/15/2016 Document Reviewed: 05/20/2013 Elsevier Interactive Patient Education  2017 [employer]  

## 2018-05-08 NOTE — Telephone Encounter (Signed)
Anne with [facility] calling to check status on receiving verbal orders for OT. Please advise.

## 2018-05-08 NOTE — Progress Notes (Addendum)
Patient ID: Lindsey Patel, female   DOB: 01-19-1927, 84 y.o.   MRN: [id number]    Subjective:  I acted as a Education administrator for Dr. Carollee Herter.  [name], [place]   Patient ID: Lindsey Patel, female    DOB: 1927/10/04, 84 y.o.   MRN: [id number]  Chief Complaint  Patient presents with  . follow up rehab discharge    had a fall and had to have left elbow and left hip surgery    HPI  Patient is in today for follow up discharge from rehab.  Pt s/o L impacted femoral neck fracture s/p crpp and a displaced olecranon fracture--  D/c from hosp to rehab on 6/11.   Patient Care Team: Carollee Herter, Alferd Apa, DO as PCP - General Dema Severin Roxy Cedar., MD (Obstetrics and Gynecology) Brunetta Genera, MD as Consulting Physician (Hematology) Jari Pigg, MD as Consulting Physician (Dermatology)   Past Medical History:  Diagnosis Date  . Chronic lymphocytic leukemia 09/22/2007   Qualifier: Diagnosis of  By: Jerold Coombe    . Fracture of elbow 04/01/2018  . Fracture of hip, closed ([place]) 04/01/2018   LEFT  . Hyperlipidemia   . Leukemia ([place])   . Osteoporosis     Past Surgical History:  Procedure Laterality Date  . CATARACT EXTRACTION  6/03   left  . CATARACT EXTRACTION  4/09   right  . ORIF ELBOW FRACTURE Left 04/02/2018   Procedure: OPEN REDUCTION INTERNAL FIXATION (ORIF) ELBOW/OLECRANON FRACTURE;  Surgeon: Shona Needles, MD;  Location: [facility];  Service: Orthopedics;  Laterality: Left;  . ORIF HIP FRACTURE Left 04/02/2018   Procedure: OPEN REDUCTION INTERNAL FIXATION HIP;  Surgeon: Shona Needles, MD;  Location: [facility];  Service: Orthopedics;  Laterality: Left;  . TUBAL LIGATION      Family History  Problem Relation Age of Onset  . Stroke Mother   . Cancer Father        stomach  . Parkinsonism Brother   . Cancer Brother        lung  . Heart disease Brother        MI  . Alzheimer's disease Sister   . Stroke Unknown   . Alzheimer's disease Unknown   . Dementia Unknown   . Parkinsonism  Unknown     Social History   Socioeconomic History  . Marital status: Married    Spouse name: Not on file  . Number of children: Not on file  . Years of education: Not on file  . Highest education level: Not on file  Occupational History  . Occupation: retired Patent attorney: RETIRED  Social Needs  . Financial resource strain: Not on file  . Food insecurity:    Worry: Not on file    Inability: Not on file  . Transportation needs:    Medical: Not on file    Non-medical: Not on file  Tobacco Use  . Smoking status: Former Smoker    Packs/day: 0.50    Years: 4.00    Pack years: 2.00    Types: Cigarettes    Last attempt to quit: 12/13/1963    Years since quitting: 54.4  . Smokeless tobacco: Never Used  Substance and Sexual Activity  . Alcohol use: No  . Drug use: Never  . Sexual activity: Not Currently    Partners: Male  Lifestyle  . Physical activity:    Days per week: Not on file    Minutes per session: Not  on file  . Stress: Not on file  Relationships  . Social connections:    Talks on phone: Not on file    Gets together: Not on file    Attends religious service: Not on file    Active member of club or organization: Not on file    Attends meetings of clubs or organizations: Not on file    Relationship status: Not on file  . Intimate partner violence:    Fear of current or ex partner: Not on file    Emotionally abused: Not on file    Physically abused: Not on file    Forced sexual activity: Not on file  Other Topics Concern  . Not on file  Social History Narrative   Walk when weather is ok    Outpatient Medications Prior to Visit  Medication Sig Dispense Refill  . aspirin EC 81 MG tablet Take 81 mg by mouth daily.    . calcium carbonate (OS-CAL) 600 MG TABS Take 600 mg by mouth 2 (two) times daily with a meal.    . fish oil-omega-3 fatty acids 1000 MG capsule Take 2 g by mouth 2 (two) times daily.     . Flaxseed, Linseed, (FLAXSEED OIL PO)  Take 1,400 mg by mouth daily.    . Multiple Vitamin (MULTIVITAMIN) tablet Take 1 tablet by mouth daily.    . simvastatin (ZOCOR) 40 MG tablet TAKE 1 TABLET BY MOUTH EVERY DAY AT 6 PM 90 tablet 1  . enoxaparin (LOVENOX) 40 MG/0.4ML injection Inject 0.4 mLs (40 mg total) into the skin daily. 0 Syringe   . HYDROcodone-acetaminophen (NORCO/VICODIN) 5-325 MG tablet Take 1 tablet by mouth every 6 (six) hours as needed for severe pain. 10 tablet 0  . traMADol (ULTRAM) 50 MG tablet Take 0.5 tablets (25 mg total) by mouth every 6 (six) hours as needed for moderate pain. 10 tablet 0   No facility-administered medications prior to visit.     Allergies  Allergen Reactions  . Pneumococcal Vaccines Other (See Comments)    Had a local skin reaction    Review of Systems  Constitutional: Negative for fever and malaise/fatigue.  HENT: Negative for congestion.   Eyes: Negative for blurred vision.  Respiratory: Negative for cough and shortness of breath.   Cardiovascular: Negative for chest pain, palpitations and leg swelling.  Gastrointestinal: Negative for vomiting.  Musculoskeletal: Negative for back pain.  Skin: Negative for rash.  Neurological: Negative for loss of consciousness and headaches.       Objective:    Physical Exam  Constitutional: She is oriented to person, place, and time. She appears well-developed and well-nourished.  HENT:  Head: Normocephalic and atraumatic.  Eyes: Conjunctivae and EOM are normal.  Neck: Normal range of motion. Neck supple. No JVD present. Carotid bruit is not present. No thyromegaly present.  Cardiovascular: Normal rate, regular rhythm and normal heart sounds.  No murmur heard. Pulmonary/Chest: Effort normal and breath sounds normal. No respiratory distress. She has no wheezes. She has no rales. She exhibits no tenderness.  Musculoskeletal: She exhibits no edema.  L elbow---  Improved range of motion but still limited and non weight bearing per ortho/ pt    Neurological: She is alert and oriented to person, place, and time.  Skin:  Scar healing well L hip/ thigh   Psychiatric: She has a normal mood and affect.  Nursing note and vitals reviewed.   BP 120/78 (BP Location: Right Arm, Cuff Size: Normal)   Pulse  80   Temp 98.6 F (37 C) (Oral)   Resp 16   Ht _0  (1.575 m)   Wt 119 lb 3.2 oz (54.1 kg)   SpO2 97%   BMI 21.80 kg/m  Wt Readings from Last 3 Encounters:  05/08/18 119 lb 3.2 oz (54.1 kg)  04/02/18 124 lb (56.2 kg)  02/20/18 126 lb 6.4 oz (57.3 kg)   BP Readings from Last 3 Encounters:  05/08/18 120/78  04/06/18 (!) 155/62  02/20/18 130/80     Immunization History  Administered Date(s) Administered  . Influenza Split 08/07/2011, 07/21/2012  . Influenza Whole 09/03/2007, 08/04/2008, 08/03/2009, 08/04/2010  . Influenza,inj,Quad PF,6+ Mos 07/24/2013, 07/12/2014, 07/13/2015, 07/12/2016, 07/11/2017  . Pneumococcal Polysaccharide-23 10/04/2004, 07/21/2012  . Td 10/03/2005    Health Maintenance  Topic Date Due  . TETANUS/TDAP  10/04/2015  . MAMMOGRAM  07/19/2017  . PNA vac Low Risk Adult (2 of 2 - PCV13) 09/05/2020 (Originally 07/21/2013)  . INFLUENZA VACCINE  05/22/2018  . DEXA SCAN  Completed    Lab Results  Component Value Date   WBC 14.4 (H) 04/06/2018   HGB 8.5 (L) 04/06/2018   HCT 26.2 (L) 04/06/2018   PLT 154 04/06/2018   GLUCOSE 99 04/06/2018   CHOL 141 02/20/2018   TRIG 95.0 02/20/2018   HDL 56.70 02/20/2018   LDLDIRECT 93.9 12/08/2008   LDLCALC 66 02/20/2018   ALT 14 02/20/2018   AST 23 02/20/2018   NA 138 04/06/2018   K 3.5 04/06/2018   CL 106 04/06/2018   CREATININE 0.40 (L) 04/06/2018   BUN 8 04/06/2018   CO2 27 04/06/2018   TSH 0.477 04/03/2018    Lab Results  Component Value Date   TSH 0.477 04/03/2018   Lab Results  Component Value Date   WBC 14.4 (H) 04/06/2018   HGB 8.5 (L) 04/06/2018   HCT 26.2 (L) 04/06/2018   MCV 95.3 04/06/2018   PLT 154 04/06/2018   Lab Results   Component Value Date   NA 138 04/06/2018   K 3.5 04/06/2018   CHLORIDE 106 07/11/2017   CO2 27 04/06/2018   GLUCOSE 99 04/06/2018   BUN 8 04/06/2018   CREATININE 0.40 (L) 04/06/2018   BILITOT 0.3 02/20/2018   ALKPHOS 60 02/20/2018   AST 23 02/20/2018   ALT 14 02/20/2018   PROT 6.2 02/20/2018   ALBUMIN 4.1 02/20/2018   CALCIUM 7.9 (L) 04/06/2018   ANIONGAP 5 04/06/2018   EGFR 80 (L) 07/11/2017   GFR 113.05 02/20/2018   Lab Results  Component Value Date   CHOL 141 02/20/2018   Lab Results  Component Value Date   HDL 56.70 02/20/2018   Lab Results  Component Value Date   LDLCALC 66 02/20/2018   Lab Results  Component Value Date   TRIG 95.0 02/20/2018   Lab Results  Component Value Date   CHOLHDL 2 02/20/2018   No results found for: HGBA1C       Assessment & Plan:   Problem List Items Addressed This Visit      Unprioritized   Memory loss    Family just starting to notice a problem We started to talk about assisted living or a place with indp living that has levels of care Pt is living with her daughter now       Relevant Orders   Ambulatory referral to Neuropsychology   S/p left hip fracture - Primary    F/u ortho         I  have discontinued Brexley C. Odenthal's HYDROcodone-acetaminophen, traMADol, and enoxaparin. I am also having her maintain her calcium carbonate, multivitamin, fish oil-omega-3 fatty acids, (Flaxseed, Linseed, (FLAXSEED OIL PO)), aspirin EC, and simvastatin.  No orders of the defined types were placed in this encounter.   CMA served as Education administrator during this visit. History, Physical and Plan performed by medical provider. Documentation and orders reviewed and attested to.  Ann Held, DO

## 2018-05-09 DIAGNOSIS — Z8781 Personal history of (healed) traumatic fracture: Secondary | ICD-10-CM | POA: Insufficient documentation

## 2018-05-09 DIAGNOSIS — R413 Other amnesia: Secondary | ICD-10-CM | POA: Insufficient documentation

## 2018-05-09 NOTE — Assessment & Plan Note (Signed)
Family just starting to notice a problem We started to talk about assisted living or a place with indp living that has levels of care Pt is living with her daughter now

## 2018-05-09 NOTE — Assessment & Plan Note (Signed)
Fu ortho

## 2018-05-12 NOTE — Telephone Encounter (Signed)
Verbal orders given- LMOM for Webb Silversmith and spoke w/ Collier Salina.

## 2018-05-22 ENCOUNTER — Telehealth: Payer: Self-pay | Admitting: Family Medicine

## 2018-05-22 ENCOUNTER — Other Ambulatory Visit: Payer: Self-pay | Admitting: Family Medicine

## 2018-05-22 DIAGNOSIS — B354 Tinea corporis: Secondary | ICD-10-CM

## 2018-05-22 MED ORDER — NYSTATIN 100000 UNIT/GM EX POWD: Freq: Four times a day (QID) | CUTANEOUS | 0 refills | Status: DC

## 2018-05-22 NOTE — Telephone Encounter (Signed)
Pt daughter name is [place] phone number [phone]. Ria Comment is also reporting patient bp today 172/88 . Pt does not have any symptoms. Pt is not on bp med

## 2018-05-22 NOTE — Telephone Encounter (Signed)
Copied from [place] [id number]. Topic: General - Other >> May 22, 2018  2:16 PM Lennox Solders wrote: Brynda Greathouse for JOI:NOMVEHM rn [facility] in [place] is calling the patient has yeast infection under both breast and in abd fold. Its red ,crusty and itching. Ria Comment would like to know if md would be willing to call into [facility] [address] phone number [phone]. Nystatin powder . Pt is staying in [place] for now  she had a fall and living with her daughter

## 2018-05-22 NOTE — Telephone Encounter (Signed)
Called pt's daughter Lindsey Patel back. Lindsey Patel reports pt did have elbow pain when bp was taken by home health nurse, and does not have any other sx's. Pt currently staying in [place] with Lindsey Patel during her recovery. Informed Lindsey Patel that pt can be taken to urgent care if blood pressure worsens or if pt develops sx's such as headache, blurred vision or dizziness. Pt has home health nurse, PT, and OT coming to the home and will continue to monitor bp. Lindsey Patel will keep [place] informed on how pt's doing.

## 2018-05-22 NOTE — Telephone Encounter (Signed)
Powder sent in She would need to be seen for bp

## 2018-05-27 ENCOUNTER — Telehealth: Payer: Self-pay | Admitting: Family Medicine

## 2018-05-27 NOTE — Telephone Encounter (Signed)
Copied from [place] [id number]. Topic: General - Other >> May 27, 2018 12:08 PM Gardiner Ramus wrote: Reason for CRM: aaron calling from [facility] home health called to inform us that pt blood pressure has been elevated. 05/22/18 160/90, 172/88 05/23/18 162/80 05/26/18 163/90 05/27/18 170/70. Please advise

## 2018-05-29 NOTE — Telephone Encounter (Signed)
Left detailed message for Lindsey Patel to let patient know that she will need to call to schedule a nurse visit if she can.

## 2018-05-29 NOTE — Telephone Encounter (Signed)
She needs to come in here for a BP check if at all possible

## 2018-06-04 ENCOUNTER — Ambulatory Visit: Payer: Medicare Other

## 2018-06-05 ENCOUNTER — Ambulatory Visit (INDEPENDENT_AMBULATORY_CARE_PROVIDER_SITE_OTHER): Payer: Medicare Other | Admitting: Family Medicine

## 2018-06-05 ENCOUNTER — Other Ambulatory Visit: Payer: Self-pay

## 2018-06-05 DIAGNOSIS — I10 Essential (primary) hypertension: Secondary | ICD-10-CM

## 2018-06-05 DIAGNOSIS — B354 Tinea corporis: Secondary | ICD-10-CM

## 2018-06-05 MED ORDER — LISINOPRIL 10 MG PO TABS: 10.0000 mg | ORAL_TABLET | Freq: Every day | ORAL | 3 refills | Status: DC

## 2018-06-05 MED ORDER — NYSTATIN 100000 UNIT/GM EX POWD: Freq: Four times a day (QID) | CUTANEOUS | 0 refills | Status: DC

## 2018-06-05 NOTE — Progress Notes (Addendum)
Pre visit review using our clinic tool,if applicable. No additional management support is needed unless otherwise documented below in the visit note.   Pt here for Blood pressure check per order from Dr. Carollee Herter dated 05/29/18.    Pt currently takes: No BP medications    BP today @ = 180//72 P = 89  Pt advised per Dr. Carollee Herter to start Lisinopril 10 mg daily and return for BP check in 2-3 weeks. Medication called in to pharmacy follow up appointment scheduled.   Reviewed   Ann Held, DO

## 2018-06-19 ENCOUNTER — Ambulatory Visit (INDEPENDENT_AMBULATORY_CARE_PROVIDER_SITE_OTHER): Payer: Medicare Other | Admitting: Family Medicine

## 2018-06-19 VITALS — BP 157/82 | HR 74

## 2018-06-19 DIAGNOSIS — I10 Essential (primary) hypertension: Secondary | ICD-10-CM

## 2018-06-19 MED ORDER — LISINOPRIL 20 MG PO TABS: 20.0000 mg | ORAL_TABLET | Freq: Every day | ORAL | 3 refills | Status: DC

## 2018-06-19 NOTE — Progress Notes (Signed)
Pre visit review using our clinic tool,if applicable. No additional management support is needed unless otherwise documented below in the visit note.   Pt here for Blood pressure check per order from Dr. Etter Sjogren -Chase  Pt currently takes: Lisinopril 10 mg qhs   Pt reports compliance with medication.  BP today @ = 157/82 HR =74  Pt advised per Dr. Carollee Herter patient to increase Lisinopril to 20 mg daily and return for BP check in 3 weeks.

## 2018-06-19 NOTE — Progress Notes (Signed)
Reviewed  Yvonne R Lowne Chase, DO  

## 2018-07-10 ENCOUNTER — Ambulatory Visit (INDEPENDENT_AMBULATORY_CARE_PROVIDER_SITE_OTHER): Payer: Medicare Other | Admitting: Family Medicine

## 2018-07-10 DIAGNOSIS — I10 Essential (primary) hypertension: Secondary | ICD-10-CM

## 2018-07-10 MED ORDER — LISINOPRIL 40 MG PO TABS: 40.0000 mg | ORAL_TABLET | Freq: Every day | ORAL | 3 refills | Status: DC

## 2018-07-10 NOTE — Progress Notes (Signed)
[facility]  HEMATOLOGY ONCOLOGY PROGRESS NOTE  Date of service:   07/11/18    Patient Care Team: Carollee Herter, Alferd Apa, DO as PCP - General Dema Severin Roxy Cedar., MD (Obstetrics and Gynecology) Brunetta Genera, MD as Consulting Physician (Hematology) Jari Pigg, MD as Consulting Physician (Dermatology)  CC: Follow-up for CLL  Diagnosis: Rai Stage 0 CLL  Current Treatment: Monitoring/Observation.  INTERVAL HISTORY:  Mrs. Asiyah Pineau is here for management and evaluation of her CLL. The patient's last visit with [place] was on 07/11/17. She is accompanied today by her son. The pt reports that she is doing well overall.   The pt reports that she has recovered well from her fall in June and surgery She notes that otherwise, she has had no new concerns in the interim. She has not had any constitutional symptoms and has not noticed any new lumps or bumps.   Lab results today (07/11/18) of CBC w/diff, CMP, and Reticulocytes is as follows: all values are WNL except for WBC at 12.1k, Lymphs abs at 7.3k, BUN at 7. 07/11/18 LDH is WNL at 187  On review of systems, pt reports stable energy levels, keeping active, and denies noticing any new lumps or bumps, fevers, chills, night sweats, unexpected weight loss, abdominal pains, leg swelling and any other symptoms.   REVIEW OF SYSTEMS:    A 10+ POINT REVIEW OF SYSTEMS WAS OBTAINED including neurology, dermatology, psychiatry, cardiac, respiratory, lymph, extremities, GI, GU, Musculoskeletal, constitutional, breasts, reproductive, HEENT.  All pertinent positives are noted in the HPI.  All others are negative.   . Past Medical History:  Diagnosis Date  . Chronic lymphocytic leukemia 09/22/2007   Qualifier: Diagnosis of  By: Jerold Coombe    . Fracture of elbow 04/01/2018  . Fracture of hip, closed ([place]) 04/01/2018   LEFT  . Hyperlipidemia   . Leukemia ([place])   . Osteoporosis     . Past Surgical History:  Procedure Laterality Date  . CATARACT  EXTRACTION  6/03   left  . CATARACT EXTRACTION  4/09   right  . ORIF ELBOW FRACTURE Left 04/02/2018   Procedure: OPEN REDUCTION INTERNAL FIXATION (ORIF) ELBOW/OLECRANON FRACTURE;  Surgeon: Shona Needles, MD;  Location: [facility];  Service: Orthopedics;  Laterality: Left;  . ORIF HIP FRACTURE Left 04/02/2018   Procedure: OPEN REDUCTION INTERNAL FIXATION HIP;  Surgeon: Shona Needles, MD;  Location: [facility];  Service: Orthopedics;  Laterality: Left;  . TUBAL LIGATION      . Social History   Tobacco Use  . Smoking status: Former Smoker    Packs/day: 0.50    Years: 4.00    Pack years: 2.00    Types: Cigarettes    Last attempt to quit: 12/13/1963    Years since quitting: 54.6  . Smokeless tobacco: Never Used  Substance Use Topics  . Alcohol use: No  . Drug use: Never    ALLERGIES:  is allergic to pneumococcal vaccines.  MEDICATIONS:  Current Outpatient Medications  Medication Sig Dispense Refill  . aspirin EC 81 MG tablet Take 81 mg by mouth daily.    . calcium carbonate (OS-CAL) 600 MG TABS Take 600 mg by mouth 2 (two) times daily with a meal.    . fish oil-omega-3 fatty acids 1000 MG capsule Take 2 g by mouth 2 (two) times daily.     . Flaxseed, Linseed, (FLAXSEED OIL PO) Take 1,400 mg by mouth daily.    Marland Kitchen lisinopril (PRINIVIL,ZESTRIL) 40 MG tablet Take  1 tablet (40 mg total) by mouth daily. 90 tablet 3  . Multiple Vitamin (MULTIVITAMIN) tablet Take 1 tablet by mouth daily.    Marland Kitchen nystatin (MYCOSTATIN/NYSTOP) powder Apply topically 4 (four) times daily. 15 g 0  . simvastatin (ZOCOR) 40 MG tablet TAKE 1 TABLET BY MOUTH EVERY DAY AT 6 PM 90 tablet 1   Current Facility-Administered Medications  Medication Dose Route Frequency Provider Last Rate Last Dose  . [START ON 07/12/2018] Influenza vac split quadrivalent PF (FLUARIX) injection 0.5 mL  0.5 mL Intramuscular Tomorrow-1000 Brunetta Genera, MD        PHYSICAL EXAMINATION: ECOG PERFORMANCE STATUS: 2 - Symptomatic, <50% confined  to bed  Vitals:   07/11/18 1207  BP: (!) 181/80  Pulse: 80  Resp: 18  Temp: 97.9 F (36.6 C)  SpO2: 98%   Filed Weights   07/11/18 1207  Weight: 115 lb (52.2 kg)   .Body mass index is 21.03 kg/m.  GENERAL:alert, in no acute distress and comfortable SKIN: no acute rashes, no significant lesions EYES: conjunctiva are pink and non-injected, sclera anicteric OROPHARYNX: MMM, no exudates, no oropharyngeal erythema or ulceration NECK: supple, no JVD LYMPH:  no palpable lymphadenopathy in the cervical, axillary or inguinal regions LUNGS: clear to auscultation b/l with normal respiratory effort HEART: regular rate & rhythm ABDOMEN:  normoactive bowel sounds , non tender, not distended. No palpable hepatosplenomegaly.  Extremity: no pedal edema PSYCH: alert & oriented x 3 with fluent speech NEURO: no focal motor/sensory deficits   LABORATORY DATA:   I have reviewed the data as listed  . CBC Latest Ref Rng & Units 07/11/2018 04/06/2018 04/05/2018  WBC 3.9 - 10.3 K/uL 12.1(H) 14.4(H) 13.6(H)  Hemoglobin 11.6 - 15.9 g/dL 13.6 8.5(L) 8.6(L)  Hematocrit 34.8 - 46.6 % 41.4 26.2(L) 26.9(L)  Platelets 145 - 400 K/uL 216 154 120(L)   . CBC    Component Value Date/Time   WBC 12.1 (H) 07/11/2018 0956   WBC 14.4 (H) 04/06/2018 0427   RBC 4.51 07/11/2018 0956   RBC 4.51 07/11/2018 0956   HGB 13.6 07/11/2018 0956   HGB 13.6 07/11/2017 1035   HCT 41.4 07/11/2018 0956   HCT 41.0 07/11/2017 1035   PLT 216 07/11/2018 0956   PLT 208 07/11/2017 1035   MCV 91.8 07/11/2018 0956   MCV 93.0 07/11/2017 1035   MCH 30.2 07/11/2018 0956   MCHC 32.9 07/11/2018 0956   RDW 13.6 07/11/2018 0956   RDW 13.1 07/11/2017 1035   LYMPHSABS 7.3 (H) 07/11/2018 0956   LYMPHSABS 10.2 (H) 07/11/2017 1035   MONOABS 0.4 07/11/2018 0956   MONOABS 0.7 07/11/2017 1035   EOSABS 0.3 07/11/2018 0956   EOSABS 0.4 07/11/2017 1035   BASOSABS 0.0 07/11/2018 0956   BASOSABS 0.1 07/11/2017 1035    . CMP Latest  Ref Rng & Units 07/11/2018 04/06/2018 04/05/2018  Glucose 70 - 99 mg/dL 97 99 99  BUN 8 - 23 mg/dL 7(L) 8 7  Creatinine 0.44 - 1.00 mg/dL 0.62 0.40(L) 0.40(L)  Sodium 135 - 145 mmol/L 140 138 137  Potassium 3.5 - 5.1 mmol/L 4.2 3.5 3.9  Chloride 98 - 111 mmol/L 104 106 104  CO2 22 - 32 mmol/L 27 27 28   Calcium 8.9 - 10.3 mg/dL 10.1 7.9(L) 8.0(L)  Total Protein 6.5 - 8.1 g/dL 6.9 - -  Total Bilirubin 0.3 - 1.2 mg/dL 0.3 - -  Alkaline Phos 38 - 126 U/L 84 - -  AST 15 - 41 U/L 19 - -  ALT 0 - 44 U/L 10 - -   . Lab Results  Component Value Date   LDH 187 07/11/2018     flow cytometry 07/13/2015 :       RADIOGRAPHIC STUDIES: I have personally reviewed the radiological images as listed and agreed with the findings in the report. No results found.  ASSESSMENT & PLAN:    84 y.o. 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 No lymphadenopathy. No hepatosplenomegaly. No cytopenias. No constitutional symptoms. -CLL prognostic fish panel sent out- showed 13q deletion which is in keeping with indolent course the patient has had an typically suggests good prognosis. Patient's CBC today shows fairly stable lymphocyte count with no significant cytopenias. LDH is within normal limits.  PLAN: -No issues with infections to suggest the need for IVIG -Continue follow-up with primary care physician for other cares.  -Discussed pt labwork today, 07/11/18; the pt's WBC have decreased over 12 months from 15.7k to 12.1k and Lymphs abs have decreased from 10.2k to 7.3k -The pt shows no clinical or lab progression of her CLL at this time.  -No indication for further treatment at this time.   -Will give flu shot in clinic today  -Will continue to see the pt once a year, sooner if any new concerns    Flu shot today RTC with dr Irene Limbo in 12 months with labs   The total time spent in the appt was 20 minutes and more than 50% was on counseling and direct  patient cares.   Sullivan Lone MD MS AAHIVMS [facility] Hematology/Oncology Physician [facility]  (Office):       [phone] (Work cell):  [phone] (Fax):           [phone]  I, Baldwin [place], am acting as a scribe for Dr. Irene Limbo  .I have reviewed the above documentation for accuracy and completeness, and I agree with the above. Brunetta Genera MD

## 2018-07-10 NOTE — Progress Notes (Signed)
Reviewed  Yvonne R Lowne Chase, DO  

## 2018-07-10 NOTE — Addendum Note (Signed)
Addended by: Vernie Shanks E on: 07/10/2018 10:59 AM   Modules accepted: Orders

## 2018-07-10 NOTE — Progress Notes (Addendum)
Pre visit review using our clinic tool,if applicable. No additional management support is needed unless otherwise documented below in the visit note.   Pt here for Blood pressure check per order from Dr. Roma Schanz.  Pt currently takes: Lisinopril 20 mg daily   Pt reports compliance with medication.  BP today @ =147/78 P =70  Pt advised per Dr. Carollee Herter to increase  Lisinopril to 40 mg daily and return for provider visit in 3 weeks.

## 2018-07-11 ENCOUNTER — Telehealth: Payer: Self-pay

## 2018-07-11 ENCOUNTER — Inpatient Hospital Stay: Payer: Medicare Other | Attending: Hematology | Admitting: Hematology

## 2018-07-11 ENCOUNTER — Inpatient Hospital Stay: Payer: Medicare Other

## 2018-07-11 ENCOUNTER — Encounter: Payer: Self-pay | Admitting: Hematology

## 2018-07-11 VITALS — BP 181/80 | HR 80 | Temp 97.9°F | Resp 18 | Ht 62.0 in | Wt 115.0 lb

## 2018-07-11 DIAGNOSIS — Z23 Encounter for immunization: Secondary | ICD-10-CM | POA: Insufficient documentation

## 2018-07-11 DIAGNOSIS — C9110 Chronic lymphocytic leukemia of B-cell type not having achieved remission: Secondary | ICD-10-CM | POA: Diagnosis not present

## 2018-07-11 LAB — RETICULOCYTES
RBC.: 4.51 MIL/uL (ref 3.70–5.45)
RETIC COUNT ABSOLUTE: 40.6 10*3/uL (ref 33.7–90.7)
Retic Ct Pct: 0.9 % (ref 0.7–2.1)

## 2018-07-11 LAB — COMPREHENSIVE METABOLIC PANEL
ALK PHOS: 84 U/L (ref 38–126)
ALT: 10 U/L (ref 0–44)
AST: 19 U/L (ref 15–41)
Albumin: 4.1 g/dL (ref 3.5–5.0)
Anion gap: 9 (ref 5–15)
BUN: 7 mg/dL — AB (ref 8–23)
CALCIUM: 10.1 mg/dL (ref 8.9–10.3)
CHLORIDE: 104 mmol/L (ref 98–111)
CO2: 27 mmol/L (ref 22–32)
CREATININE: 0.62 mg/dL (ref 0.44–1.00)
GFR calc Af Amer: >60 mL/min (ref >60)
Glucose, Bld: 97 mg/dL (ref 70–99)
Potassium: 4.2 mmol/L (ref 3.5–5.1)
SODIUM: 140 mmol/L (ref 135–145)
Total Bilirubin: 0.3 mg/dL (ref 0.3–1.2)
Total Protein: 6.9 g/dL (ref 6.5–8.1)

## 2018-07-11 LAB — CBC WITH DIFFERENTIAL (CANCER CENTER ONLY)
BASOS ABS: 0 10*3/uL (ref 0.0–0.1)
Basophils Relative: 0 %
Eosinophils Absolute: 0.3 10*3/uL (ref 0.0–0.5)
Eosinophils Relative: 3 %
HCT: 41.4 % (ref 34.8–46.6)
Hemoglobin: 13.6 g/dL (ref 11.6–15.9)
LYMPHS ABS: 7.3 10*3/uL — AB (ref 0.9–3.3)
Lymphocytes Relative: 60 %
MCH: 30.2 pg (ref 25.1–34.0)
MCHC: 32.9 g/dL (ref 31.5–36.0)
MCV: 91.8 fL (ref 79.5–101.0)
MONO ABS: 0.4 10*3/uL (ref 0.1–0.9)
Monocytes Relative: 3 %
Neutro Abs: 4 10*3/uL (ref 1.5–6.5)
Neutrophils Relative %: 34 %
Platelet Count: 216 10*3/uL (ref 145–400)
RBC: 4.51 MIL/uL (ref 3.70–5.45)
RDW: 13.6 % (ref 11.2–14.5)
WBC Count: 12.1 10*3/uL — ABNORMAL HIGH (ref 3.9–10.3)

## 2018-07-11 LAB — LACTATE DEHYDROGENASE: LDH: 187 U/L (ref 98–192)

## 2018-07-11 MED ORDER — INFLUENZA VAC SPLIT QUAD 0.5 ML IM SUSY
PREFILLED_SYRINGE | INTRAMUSCULAR | Status: AC
  Filled 2018-07-11: qty 0.5

## 2018-07-11 MED ORDER — INFLUENZA VAC SPLIT QUAD 0.5 ML IM SUSY
0.5000 mL | PREFILLED_SYRINGE | INTRAMUSCULAR | Status: AC
  Administered 2018-07-11: 0.5 mL via INTRAMUSCULAR

## 2018-07-11 NOTE — Telephone Encounter (Signed)
Printed avs and calender of upcoming appointment. Per 9/20 los 

## 2018-07-31 ENCOUNTER — Other Ambulatory Visit: Payer: Self-pay | Admitting: *Deleted

## 2018-07-31 ENCOUNTER — Other Ambulatory Visit: Payer: Self-pay | Admitting: Medical

## 2018-07-31 ENCOUNTER — Ambulatory Visit (INDEPENDENT_AMBULATORY_CARE_PROVIDER_SITE_OTHER): Payer: Medicare Other | Admitting: Medical

## 2018-07-31 ENCOUNTER — Ambulatory Visit: Payer: Medicare Other | Admitting: Family Medicine

## 2018-07-31 VITALS — BP 148/69 | HR 69

## 2018-07-31 DIAGNOSIS — I10 Essential (primary) hypertension: Secondary | ICD-10-CM

## 2018-07-31 MED ORDER — AMLODIPINE BESYLATE 5 MG PO TABS: 5.0000 mg | ORAL_TABLET | Freq: Every day | ORAL | 0 refills | Status: DC

## 2018-07-31 NOTE — Progress Notes (Addendum)
Pre visit review using our clinic tool,if applicable. No additional management support is needed unless otherwise documented below in the visit note.   Pt here for Blood pressure check per order from Dr. Carollee Herter  Pt currently takes: Lisinopril 20 mg daily   Pt reports compliance with medication. No complaints voiced this visit.  BP today = 148/69 HR =69  Pt advised per [employer] PA-C add Amlodipine 5 mg daily to medication regemine.  See Dr. Carollee Herter in 2 weeks. Patient agreed.  This is the advise that I gave.   Mackie Pai, PA-C

## 2018-08-14 ENCOUNTER — Encounter: Payer: Self-pay | Admitting: Family Medicine

## 2018-08-14 ENCOUNTER — Ambulatory Visit (INDEPENDENT_AMBULATORY_CARE_PROVIDER_SITE_OTHER): Payer: Medicare Other | Admitting: Family Medicine

## 2018-08-14 VITALS — BP 136/62 | HR 75 | Temp 98.1°F | Resp 16 | Ht 62.0 in | Wt 116.6 lb

## 2018-08-14 DIAGNOSIS — E785 Hyperlipidemia, unspecified: Secondary | ICD-10-CM | POA: Diagnosis not present

## 2018-08-14 DIAGNOSIS — I10 Essential (primary) hypertension: Secondary | ICD-10-CM | POA: Diagnosis not present

## 2018-08-14 DIAGNOSIS — R413 Other amnesia: Secondary | ICD-10-CM | POA: Diagnosis not present

## 2018-08-14 DIAGNOSIS — R29818 Other symptoms and signs involving the nervous system: Secondary | ICD-10-CM | POA: Diagnosis not present

## 2018-08-14 LAB — COMPREHENSIVE METABOLIC PANEL
ALBUMIN: 4.3 g/dL (ref 3.5–5.2)
ALT: 16 U/L (ref 0–35)
AST: 21 U/L (ref 0–37)
Alkaline Phosphatase: 80 U/L (ref 39–117)
BUN: 10 mg/dL (ref 6–23)
CHLORIDE: 98 meq/L (ref 96–112)
CO2: 28 meq/L (ref 19–32)
Calcium: 9.7 mg/dL (ref 8.4–10.5)
Creatinine, Ser: 0.52 mg/dL (ref 0.40–1.20)
GFR: 117.96 mL/min (ref >60.00)
Glucose, Bld: 95 mg/dL (ref 70–99)
POTASSIUM: 4.9 meq/L (ref 3.5–5.1)
SODIUM: 135 meq/L (ref 135–145)
Total Bilirubin: 0.4 mg/dL (ref 0.2–1.2)
Total Protein: 6.4 g/dL (ref 6.0–8.3)

## 2018-08-14 LAB — POC URINALSYSI DIPSTICK (AUTOMATED)
Bilirubin, UA: NEGATIVE
Blood, UA: NEGATIVE
Glucose, UA: NEGATIVE
KETONES UA: NEGATIVE
Nitrite, UA: NEGATIVE
Protein, UA: NEGATIVE
Spec Grav, UA: 1.015 (ref 1.010–1.025)
Urobilinogen, UA: 0.2 E.U./dL
pH, UA: 7.5 (ref 5.0–8.0)

## 2018-08-14 LAB — LIPID PANEL
CHOL/HDL RATIO: 3
Cholesterol: 160 mg/dL (ref 0–200)
HDL: 61.1 mg/dL (ref >39.00)
LDL CALC: 77 mg/dL (ref 0–99)
NONHDL: 99.11
TRIGLYCERIDES: 112 mg/dL (ref 0.0–149.0)
VLDL: 22.4 mg/dL (ref 0.0–40.0)

## 2018-08-14 LAB — CBC WITH DIFFERENTIAL/PLATELET
Basophils Absolute: 0 10*3/uL (ref 0.0–0.1)
Basophils Relative: 0.2 % (ref 0.0–3.0)
EOS ABS: 0.4 10*3/uL (ref 0.0–0.7)
Eosinophils Relative: 2.7 % (ref 0.0–5.0)
HCT: 40.6 % (ref 36.0–46.0)
Hemoglobin: 13.4 g/dL (ref 12.0–15.0)
LYMPHS ABS: 7.6 10*3/uL — AB (ref 0.7–4.0)
Lymphocytes Relative: 57.1 % — ABNORMAL HIGH (ref 12.0–46.0)
MCHC: 33.1 g/dL (ref 30.0–36.0)
MCV: 91.4 fl (ref 78.0–100.0)
MONO ABS: 0.8 10*3/uL (ref 0.1–1.0)
Monocytes Relative: 5.6 % (ref 3.0–12.0)
NEUTROS ABS: 4.6 10*3/uL (ref 1.4–7.7)
NEUTROS PCT: 34.4 % — AB (ref 43.0–77.0)
PLATELETS: 242 10*3/uL (ref 150.0–400.0)
RBC: 4.44 Mil/uL (ref 3.87–5.11)
RDW: 14.3 % (ref 11.5–15.5)
WBC: 13.4 10*3/uL — ABNORMAL HIGH (ref 4.0–10.5)

## 2018-08-14 LAB — TSH: TSH: 1.04 u[IU]/mL (ref 0.35–4.50)

## 2018-08-14 LAB — VITAMIN B12: VITAMIN B 12: 458 pg/mL (ref 211–911)

## 2018-08-14 MED ORDER — DONEPEZIL HCL 5 MG PO TABS: 5.0000 mg | ORAL_TABLET | Freq: Every day | ORAL | 5 refills | Status: DC

## 2018-08-14 MED ORDER — AMLODIPINE BESYLATE 5 MG PO TABS: 5.0000 mg | ORAL_TABLET | Freq: Every day | ORAL | 1 refills | Status: DC

## 2018-08-14 NOTE — Assessment & Plan Note (Signed)
Tolerating statin, encouraged heart healthy diet, avoid trans fats, minimize simple carbs and saturated fats. Increase exercise as tolerated 

## 2018-08-14 NOTE — Assessment & Plan Note (Signed)
Refer to neuro and neuropsych Start aricept Pt is not driving --- pt had driving eval done---  We will obtain a copy

## 2018-08-14 NOTE — Progress Notes (Signed)
Patient ID: Lindsey Patel, female    DOB: [birth year]  Age: 84 y.o. MRN: [id number]    Subjective:  Subjective  HPI Lindsey Patel presents for f/u bp and chol Her daughter is with her.  The family is concerned about her memory.    She had a driving eval done.  We will get a copy.   She is no longer driving   No other complaints.  Review of Systems  Constitutional: Negative for appetite change, diaphoresis, fatigue and unexpected weight change.  Eyes: Negative for pain, redness and visual disturbance.  Respiratory: Negative for cough, chest tightness, shortness of breath and wheezing.   Cardiovascular: Negative for chest pain, palpitations and leg swelling.  Endocrine: Negative for cold intolerance, heat intolerance, polydipsia, polyphagia and polyuria.  Genitourinary: Negative for difficulty urinating, dysuria and frequency.  Neurological: Negative for dizziness, light-headedness, numbness and headaches.  Psychiatric/Behavioral: Positive for confusion. Negative for decreased concentration, dysphoric mood, hallucinations, self-injury and sleep disturbance. The patient is not nervous/anxious and is not hyperactive.     History Past Medical History:  Diagnosis Date  . Chronic lymphocytic leukemia 09/22/2007   Qualifier: Diagnosis of  By: Jerold Coombe    . Fracture of elbow 04/01/2018  . Fracture of hip, closed ([place]) 04/01/2018   LEFT  . Hyperlipidemia   . Leukemia ([place])   . Osteoporosis     She has a past surgical history that includes Tubal ligation; Cataract extraction (6/03); Cataract extraction (4/09); ORIF elbow fracture (Left, 04/02/2018); and ORIF hip fracture (Left, 04/02/2018).   Her family history includes Alzheimer's disease in her sister and unknown relative; Cancer in her brother and father; Dementia in her unknown relative; Heart disease in her brother; Parkinsonism in her brother and unknown relative; Stroke in her mother and unknown relative.She reports that she quit  smoking about 54 years ago. Her smoking use included cigarettes. She has a 2.00 pack-year smoking history. She has never used smokeless tobacco. She reports that she does not drink alcohol or use drugs.  Current Outpatient Medications on File Prior to Visit  Medication Sig Dispense Refill  . aspirin EC 81 MG tablet Take 81 mg by mouth daily.    . calcium carbonate (OS-CAL) 600 MG TABS Take 600 mg by mouth 2 (two) times daily with a meal.    . fish oil-omega-3 fatty acids 1000 MG capsule Take 2 g by mouth 2 (two) times daily.     . Flaxseed, Linseed, (FLAXSEED OIL PO) Take 1,400 mg by mouth daily.    Marland Kitchen lisinopril (PRINIVIL,ZESTRIL) 40 MG tablet Take 1 tablet (40 mg total) by mouth daily. 90 tablet 3  . Multiple Vitamin (MULTIVITAMIN) tablet Take 1 tablet by mouth daily.    Marland Kitchen nystatin (MYCOSTATIN/NYSTOP) powder Apply topically 4 (four) times daily. 15 g 0  . simvastatin (ZOCOR) 40 MG tablet TAKE 1 TABLET BY MOUTH EVERY DAY AT 6 PM 90 tablet 1   No current facility-administered medications on file prior to visit.      Objective:  Objective  Physical Exam  Constitutional: She is oriented to person, place, and time. She appears well-developed and well-nourished.  HENT:  Head: Normocephalic and atraumatic.  Eyes: Conjunctivae and EOM are normal.  Neck: Normal range of motion. Neck supple. No JVD present. Carotid bruit is not present. No thyromegaly present.  Cardiovascular: Normal rate, regular rhythm and normal heart sounds.  No murmur heard. Pulmonary/Chest: Effort normal and breath sounds normal. No respiratory distress. She has no  wheezes. She has no rales. She exhibits no tenderness.  Musculoskeletal: She exhibits no edema.  Neurological: She is alert and oriented to person, place, and time.  Pt c/o memory issues.  She was able to answer all questions in exam room appropriately.    Psychiatric: She has a normal mood and affect.  Nursing note and vitals reviewed.  BP 136/62 (BP  Location: Right Arm, Cuff Size: Normal)   Pulse 75   Temp 98.1 F (36.7 C) (Oral)   Resp 16   Ht 5\' 2"  (1.575 m)   Wt 116 lb 9.6 oz (52.9 kg)   SpO2 97%   BMI 21.33 kg/m  Wt Readings from Last 3 Encounters:  08/14/18 116 lb 9.6 oz (52.9 kg)  07/11/18 115 lb (52.2 kg)  05/08/18 119 lb 3.2 oz (54.1 kg)     Lab Results  Component Value Date   WBC 12.1 (H) 07/11/2018   HGB 13.6 07/11/2018   HCT 41.4 07/11/2018   PLT 216 07/11/2018   GLUCOSE 97 07/11/2018   CHOL 141 02/20/2018   TRIG 95.0 02/20/2018   HDL 56.70 02/20/2018   LDLDIRECT 93.9 12/08/2008   LDLCALC 66 02/20/2018   ALT 10 07/11/2018   AST 19 07/11/2018   NA 140 07/11/2018   K 4.2 07/11/2018   CL 104 07/11/2018   CREATININE 0.62 07/11/2018   BUN 7 (L) 07/11/2018   CO2 27 07/11/2018   TSH 0.477 04/03/2018    Dg Chest 1 View  Result Date: 04/01/2018 CLINICAL DATA:  Fall at home with left hip and elbow fractures. EXAM: CHEST  1 VIEW COMPARISON:  None. FINDINGS: The cardiomediastinal contours are normal. The lungs are clear. Pulmonary vasculature is normal. No consolidation, pleural effusion, or pneumothorax. No acute osseous abnormalities are seen. The bones are under mineralized. IMPRESSION: No acute chest findings. Electronically Signed   By: Jeb Levering M.D.   On: 04/01/2018 23:39   Dg Elbow 2 Views Left  Result Date: 04/02/2018 CLINICAL DATA:  Status post olecranon fracture repair. EXAM: LEFT ELBOW - 2 VIEW COMPARISON:  None. FINDINGS: The patient is now within a cast. The olecranon fracture has been partially reduced with improved alignment. IMPRESSION: Olecranon non fracture reduction as above. Electronically Signed   By: Dorise Bullion III M.D   On: 04/02/2018 18:10   Dg Elbow 2 Views Left  Result Date: 04/02/2018 CLINICAL DATA:  Olecranon fracture EXAM: LEFT ELBOW - 2 VIEW; DG C-ARM 61-120 MIN COMPARISON:  04/01/2018 FLUOROSCOPY TIME:  Fluoroscopy Time:  16 seconds Radiation Exposure Index (if provided  by the fluoroscopic device): Not available Number of Acquired Spot Images: 4 FINDINGS: The previously seen olecranon fracture is again identified. It was subsequently reduced. The termination film demonstrates near anatomic alignment of the fracture fragments. IMPRESSION: Reduction of olecranon fracture. Electronically Signed   By: Inez Catalina M.D.   On: 04/02/2018 16:17   Dg Elbow Complete Left  Result Date: 04/01/2018 CLINICAL DATA:  Left elbow pain and bleeding after fall at home. Pain about the olecranon. EXAM: LEFT ELBOW - COMPLETE 3+ VIEW COMPARISON:  None. FINDINGS: Mid olecranon fracture with distraction of approximately 16 mm at the articular surface and 13 mm of the posterior cortex. Fracture is mildly comminuted. Mild retraction of the proximal fragment. Associated joint effusion and large amount of soft tissue edema. The radius and distal humerus appear intact. IMPRESSION: Significantly displaced and mildly comminuted mid olecranon fracture with articular involvement. Electronically Signed   By: Jeb Levering  M.D.   On: 04/01/2018 23:38   Dg C-arm 1-60 Min  Result Date: 04/02/2018 CLINICAL DATA:  Left hip fracture repair FLUOROSCOPY TIME:  2 minutes and 45 seconds. Images: 7 EXAM: DG C-ARM 61-120 MIN COMPARISON:  None. FINDINGS: Three screws have been placed across the left hip fracture by the end of the study. IMPRESSION: Three screws have been placed across the left hip fracture by the end of the study. Electronically Signed   By: Dorise Bullion III M.D   On: 04/02/2018 16:24   Dg C-arm 1-60 Min  Result Date: 04/02/2018 CLINICAL DATA:  Olecranon fracture EXAM: LEFT ELBOW - 2 VIEW; DG C-ARM 61-120 MIN COMPARISON:  04/01/2018 FLUOROSCOPY TIME:  Fluoroscopy Time:  16 seconds Radiation Exposure Index (if provided by the fluoroscopic device): Not available Number of Acquired Spot Images: 4 FINDINGS: The previously seen olecranon fracture is again identified. It was subsequently reduced. The  termination film demonstrates near anatomic alignment of the fracture fragments. IMPRESSION: Reduction of olecranon fracture. Electronically Signed   By: Inez Catalina M.D.   On: 04/02/2018 16:17   Dg Hip Operative Unilat W Or W/o Pelvis Left  Result Date: 04/02/2018 CLINICAL DATA:  Left hip fracture repair FLUOROSCOPY TIME:  2 minutes and 45 seconds. Images: 7 EXAM: DG C-ARM 61-120 MIN COMPARISON:  None. FINDINGS: Three screws have been placed across the left hip fracture by the end of the study. IMPRESSION: Three screws have been placed across the left hip fracture by the end of the study. Electronically Signed   By: Dorise Bullion III M.D   On: 04/02/2018 16:24   Dg Hip Unilat W Or W/o Pelvis 2-3 Views Left  Result Date: 04/02/2018 CLINICAL DATA:  Fracture. EXAM: DG HIP (WITH OR WITHOUT PELVIS) 2-3V LEFT COMPARISON:  04/02/2018 FINDINGS: There are 3 Knowles pins identified within the left hip status post ORIF of impacted fracture of the left femoral neck. The hardware components and fracture fragments are in anatomic alignment. Left superior and inferior pubic rami fractures are again noted. IMPRESSION: 1. Status post ORIF of left femoral neck fracture. 2. Left pubic rami fractures. Electronically Signed   By: Kerby Moors M.D.   On: 04/02/2018 18:10   Dg Hip Unilat W Or Wo Pelvis 2-3 Views Left  Result Date: 04/01/2018 CLINICAL DATA:  Fall at home with left hip pain. EXAM: DG HIP (WITH OR WITHOUT PELVIS) 2-3V LEFT COMPARISON:  None. FINDINGS: Impacted fracture of the left femoral neck with mild angulation. Femoral head remains seated. Minimally displaced fracture of the left superior pubic ramus and nondisplaced inferior ramus fracture are age indeterminate. Right femoral head is seated in the acetabulum. Pubic symphysis and sacroiliac joints are congruent. The bones are under mineralized. Multiple pelvic phleboliths. IMPRESSION: 1. Impacted fracture of the left femoral neck with angulation. 2.  Left superior and inferior pubic ramus fractures are age indeterminate, but may be acute. Electronically Signed   By: Jeb Levering M.D.   On: 04/01/2018 23:35     Assessment & Plan:  Plan  I am having Markeesha C. Quin start on donepezil. I am also having her maintain her calcium carbonate, multivitamin, fish oil-omega-3 fatty acids, (Flaxseed, Linseed, (FLAXSEED OIL PO)), aspirin EC, simvastatin, nystatin, lisinopril, and amLODipine.  Meds ordered this encounter  Medications  . donepezil (ARICEPT) 5 MG tablet    Sig: Take 1 tablet (5 mg total) by mouth at bedtime.    Dispense:  30 tablet    Refill:  5  .  amLODipine (NORVASC) 5 MG tablet    Sig: Take 1 tablet (5 mg total) by mouth daily.    Dispense:  90 tablet    Refill:  1    Problem List Items Addressed This Visit      Unprioritized   Essential hypertension - Primary    Well controlled, no changes to meds. Encouraged heart healthy diet such as the DASH diet and exercise as tolerated.       Relevant Medications   amLODipine (NORVASC) 5 MG tablet   Other Relevant Orders   Lipid panel   CBC with Differential/Platelet   TSH   Vitamin B12   Comprehensive metabolic panel   POCT Urinalysis Dipstick (Automated) (Completed)   Hyperlipidemia    Tolerating statin, encouraged heart healthy diet, avoid trans fats, minimize simple carbs and saturated fats. Increase exercise as tolerated      Relevant Medications   amLODipine (NORVASC) 5 MG tablet   Other Relevant Orders   Lipid panel   Comprehensive metabolic panel   Memory loss    Refer to neuro and neuropsych Start aricept Pt is not driving --- pt had driving eval done---  We will obtain a copy       Relevant Medications   donepezil (ARICEPT) 5 MG tablet   Other Relevant Orders   Ambulatory referral to Neurology   Ambulatory referral to Neuropsychology   Lipid panel   CBC with Differential/Platelet   TSH   Vitamin B12   Comprehensive metabolic panel   POCT  Urinalysis Dipstick (Automated) (Completed)   Urine Culture    Other Visit Diagnoses    Other symptoms and signs involving the nervous system       Relevant Orders   MR Brain Wo Contrast      Follow-up: Return in about 3 months (around 11/14/2018), or if symptoms worsen or fail to improve.  Ann Held, DO

## 2018-08-14 NOTE — Patient Instructions (Signed)
DASH Eating Plan DASH stands for "Dietary Approaches to Stop Hypertension." The DASH eating plan is a healthy eating plan that has been shown to reduce high blood pressure (hypertension). It may also reduce your risk for type 2 diabetes, heart disease, and stroke. The DASH eating plan may also help with weight loss. What are tips for following this plan? General guidelines  Avoid eating more than 2,300 mg (milligrams) of salt (sodium) a day. If you have hypertension, you may need to reduce your sodium intake to 1,500 mg a day.  Limit alcohol intake to no more than 1 drink a day for nonpregnant women and 2 drinks a day for men. One drink equals 12 oz of beer, 5 oz of wine, or 1 oz of hard liquor.  Work with your health care provider to maintain a healthy body weight or to lose weight. Ask what an ideal weight is for you.  Get at least 30 minutes of exercise that causes your heart to beat faster (aerobic exercise) most days of the week. Activities may include walking, swimming, or biking.  Work with your health care provider or diet and nutrition specialist (dietitian) to adjust your eating plan to your individual calorie needs. Reading food labels  Check food labels for the amount of sodium per serving. Choose foods with less than 5 percent of the Daily Value of sodium. Generally, foods with less than 300 mg of sodium per serving fit into this eating plan.  To find whole grains, look for the word "whole" as the first word in the ingredient list. Shopping  Buy products labeled as "low-sodium" or "no salt added."  Buy fresh foods. Avoid canned foods and premade or frozen meals. Cooking  Avoid adding salt when cooking. Use salt-free seasonings or herbs instead of table salt or sea salt. Check with your health care provider or pharmacist before using salt substitutes.  Do not fry foods. Cook foods using healthy methods such as baking, boiling, grilling, and broiling instead.  Cook with  heart-healthy oils, such as olive, canola, soybean, or sunflower oil. Meal planning   Eat a balanced diet that includes: ? 5 or more servings of fruits and vegetables each day. At each meal, try to fill half of your plate with fruits and vegetables. ? Up to 6-8 servings of whole grains each day. ? Less than 6 oz of lean meat, poultry, or fish each day. A 3-oz serving of meat is about the same size as a deck of cards. One egg equals 1 oz. ? 2 servings of low-fat dairy each day. ? A serving of nuts, seeds, or beans 5 times each week. ? Heart-healthy fats. Healthy fats called Omega-3 fatty acids are found in foods such as flaxseeds and coldwater fish, like sardines, salmon, and mackerel.  Limit how much you eat of the following: ? Canned or prepackaged foods. ? Food that is high in trans fat, such as fried foods. ? Food that is high in saturated fat, such as fatty meat. ? Sweets, desserts, sugary drinks, and other foods with added sugar. ? Full-fat dairy products.  Do not salt foods before eating.  Try to eat at least 2 vegetarian meals each week.  Eat more home-cooked food and less restaurant, buffet, and fast food.  When eating at a restaurant, ask that your food be prepared with less salt or no salt, if possible. What foods are recommended? The items listed may not be a complete list. Talk with your dietitian about what   dietary choices are best for you. Grains Whole-grain or whole-wheat bread. Whole-grain or whole-wheat pasta. Brown rice. Oatmeal. Quinoa. Bulgur. Whole-grain and low-sodium cereals. Pita bread. Low-fat, low-sodium crackers. Whole-wheat flour tortillas. Vegetables Fresh or frozen vegetables (raw, steamed, roasted, or grilled). Low-sodium or reduced-sodium tomato and vegetable juice. Low-sodium or reduced-sodium tomato sauce and tomato paste. Low-sodium or reduced-sodium canned vegetables. Fruits All fresh, dried, or frozen fruit. Canned fruit in natural juice (without  added sugar). Meat and other protein foods Skinless chicken or turkey. Ground chicken or turkey. Pork with fat trimmed off. Fish and seafood. Egg whites. Dried beans, peas, or lentils. Unsalted nuts, nut butters, and seeds. Unsalted canned beans. Lean cuts of beef with fat trimmed off. Low-sodium, lean deli meat. Dairy Low-fat (1%) or fat-free (skim) milk. Fat-free, low-fat, or reduced-fat cheeses. Nonfat, low-sodium ricotta or cottage cheese. Low-fat or nonfat yogurt. Low-fat, low-sodium cheese. Fats and oils Soft margarine without trans fats. Vegetable oil. Low-fat, reduced-fat, or light mayonnaise and salad dressings (reduced-sodium). Canola, safflower, olive, soybean, and sunflower oils. Avocado. Seasoning and other foods Herbs. Spices. Seasoning mixes without salt. Unsalted popcorn and pretzels. Fat-free sweets. What foods are not recommended? The items listed may not be a complete list. Talk with your dietitian about what dietary choices are best for you. Grains Baked goods made with fat, such as croissants, muffins, or some breads. Dry pasta or rice meal packs. Vegetables Creamed or fried vegetables. Vegetables in a cheese sauce. Regular canned vegetables (not low-sodium or reduced-sodium). Regular canned tomato sauce and paste (not low-sodium or reduced-sodium). Regular tomato and vegetable juice (not low-sodium or reduced-sodium). Pickles. Olives. Fruits Canned fruit in a light or heavy syrup. Fried fruit. Fruit in cream or butter sauce. Meat and other protein foods Fatty cuts of meat. Ribs. Fried meat. Bacon. Sausage. Bologna and other processed lunch meats. Salami. Fatback. Hotdogs. Bratwurst. Salted nuts and seeds. Canned beans with added salt. Canned or smoked fish. Whole eggs or egg yolks. Chicken or turkey with skin. Dairy Whole or 2% milk, cream, and half-and-half. Whole or full-fat cream cheese. Whole-fat or sweetened yogurt. Full-fat cheese. Nondairy creamers. Whipped toppings.  Processed cheese and cheese spreads. Fats and oils Butter. Stick margarine. Lard. Shortening. Ghee. Bacon fat. Tropical oils, such as coconut, palm kernel, or palm oil. Seasoning and other foods Salted popcorn and pretzels. Onion salt, garlic salt, seasoned salt, table salt, and sea salt. Worcestershire sauce. Tartar sauce. Barbecue sauce. Teriyaki sauce. Soy sauce, including reduced-sodium. Steak sauce. Canned and packaged gravies. Fish sauce. Oyster sauce. Cocktail sauce. Horseradish that you find on the shelf. Ketchup. Mustard. Meat flavorings and tenderizers. Bouillon cubes. Hot sauce and Tabasco sauce. Premade or packaged marinades. Premade or packaged taco seasonings. Relishes. Regular salad dressings. Where to find more information:  National Heart, Lung, and Blood Institute: www.nhlbi.nih.gov  American Heart Association: www.heart.org Summary  The DASH eating plan is a healthy eating plan that has been shown to reduce high blood pressure (hypertension). It may also reduce your risk for type 2 diabetes, heart disease, and stroke.  With the DASH eating plan, you should limit salt (sodium) intake to 2,300 mg a day. If you have hypertension, you may need to reduce your sodium intake to 1,500 mg a day.  When on the DASH eating plan, aim to eat more fresh fruits and vegetables, whole grains, lean proteins, low-fat dairy, and heart-healthy fats.  Work with your health care provider or diet and nutrition specialist (dietitian) to adjust your eating plan to your individual   calorie needs. This information is not intended to replace advice given to you by your health care provider. Make sure you discuss any questions you have with your health care provider. Document Released: 09/27/2011 Document Revised: 10/01/2016 Document Reviewed: 10/01/2016 Elsevier Interactive Patient Education  2018 Elsevier Inc.  

## 2018-08-14 NOTE — Assessment & Plan Note (Signed)
Well controlled, no changes to meds. Encouraged heart healthy diet such as the DASH diet and exercise as tolerated.  °

## 2018-08-15 ENCOUNTER — Encounter: Payer: Self-pay | Admitting: *Deleted

## 2018-08-15 LAB — URINE CULTURE
MICRO NUMBER:: 91280923
Result:: NO GROWTH
SPECIMEN QUALITY:: ADEQUATE

## 2018-08-18 ENCOUNTER — Encounter: Payer: Self-pay | Admitting: Neurology

## 2018-08-19 ENCOUNTER — Telehealth: Payer: Self-pay

## 2018-08-19 NOTE — Telephone Encounter (Signed)
Copied from [place] [id number]. Topic: Referral - Question >> Aug 19, 2018 11:20 AM Percell Belt A wrote: Reason for CRM: Pt daughter called in and has some questions about the mri that pt is going to have Friday.  She would like a call back from the referral coordinator with the instructions   Zacarias Pontes [phone]

## 2018-08-20 ENCOUNTER — Telehealth: Payer: Self-pay | Admitting: *Deleted

## 2018-08-20 NOTE — Telephone Encounter (Signed)
Daughter given phone number to call imaging to get specific directions

## 2018-08-20 NOTE — Telephone Encounter (Signed)
Daughter wanted to know if we can get patient in with neurology sooner.  She is scheduled with [facility] in January and feels her mother should not wait.    Spoke with referral coordinator and she stated that other office in [place] is booked as well and another option could be we can send somewhere in [place].   Left message on daughter machine to call to let us know if she would like to try an office in [place].

## 2018-08-20 NOTE — Telephone Encounter (Signed)
Routed to Gwen, referral coordinator 

## 2018-08-20 NOTE — Telephone Encounter (Signed)
Left message on machined for patient daughter to call back  Please get questions

## 2018-08-20 NOTE — Telephone Encounter (Signed)
Received packer with Letter from child, HealthCare POA, and Lindsey Patel; forwarded to provider/SLS

## 2018-08-20 NOTE — Telephone Encounter (Signed)
Pt's daughter returned call, she says an office in [facility] is perfectly fine.

## 2018-08-22 ENCOUNTER — Ambulatory Visit (HOSPITAL_COMMUNITY)
Admission: RE | Admit: 2018-08-22 | Discharge: 2018-08-22 | Disposition: A | Discharge status: Home / Self Care | Payer: Medicare Other | Source: Ambulatory Visit | Attending: Family Medicine | Admitting: Family Medicine

## 2018-08-22 DIAGNOSIS — R29818 Other symptoms and signs involving the nervous system: Secondary | ICD-10-CM | POA: Insufficient documentation

## 2018-08-22 DIAGNOSIS — G319 Degenerative disease of nervous system, unspecified: Secondary | ICD-10-CM | POA: Diagnosis not present

## 2018-08-25 ENCOUNTER — Other Ambulatory Visit: Payer: Self-pay | Admitting: Family Medicine

## 2018-08-27 NOTE — Telephone Encounter (Signed)
Sent referral to [facility] in [place]

## 2018-08-28 ENCOUNTER — Telehealth: Payer: Self-pay

## 2018-08-28 NOTE — Telephone Encounter (Signed)
Pt's daughter given results per notes of Dr Carollee Herter on 08/18/18.Unable to document in result note due to result note not being routed to [facility].

## 2018-09-03 ENCOUNTER — Encounter: Payer: Self-pay | Admitting: *Deleted

## 2018-09-15 NOTE — Progress Notes (Signed)
Subjective:   Lindsey Patel is a 84 y.o. female who presents for Medicare Annual (Subsequent) preventive examination.  Pt accompanied by dtr.  Review of Systems: No ROS.  Medicare Wellness Visit. Additional risk factors are reflected in the social history. Cardiac Risk Factors include: advanced age (>69mn, >>57women);dyslipidemia;hypertension Sleep patterns:  Home Safety/Smoke Alarms: Feels safe in home. Smoke alarms in place. Sleeps 6-8 hrs. Feels rested. Lives alone in 1 story home. Stairs to garage. Walk-in shower.   Female:       Mammo- last done 07/2018. Normal per pt and dtr.      Dexa scan- declines       Eye- yearly. UTD per pt. Dr.Fossy.     Objective:     Vitals: BP 138/60 (BP Location: Left Arm, Patient Position: Sitting, Cuff Size: Normal)   Pulse 70   Ht 5' 1"  (1.549 m)   Wt 117 lb 6.4 oz (53.3 kg)   SpO2 97%   BMI 22.18 kg/m   Body mass index is 22.18 kg/m.  Advanced Directives 09/16/2018 09/03/2018 05/08/2018 04/02/2018 09/10/2017 07/11/2017 09/07/2016  Does Patient Have a Medical Advance Directive? Yes Yes Yes No Yes Yes Yes  Type of [place]amed[place]of [place] will Healthcare Power of [place] will [place]  Does patient want to make changes to medical advance directive? No - Patient declined No - Patient declined No - Patient declined - - - No - Patient declined  Copy of HGillespiein Chart? Yes - validated most recent copy scanned in chart (See row information) - Yes - No - copy requested - No - copy requested  Would patient like information on creating a medical advance directive? - - - No - Patient declined - - -    Tobacco Social History   Tobacco Use  Smoking Status Former Smoker  . Packs/day: 0.50  . Years: 4.00  . Pack years: 2.00  . Types: Cigarettes  . Last attempt to quit: 12/13/1963  . Years since  quitting: 54.7  Smokeless Tobacco Never Used     Counseling given: Not Answered   Clinical Intake:     Pain : No/denies pain                 Past Medical History:  Diagnosis Date  . Chronic lymphocytic leukemia 09/22/2007   Qualifier: Diagnosis of  By: LJerold Coombe   . Fracture of elbow 04/01/2018  . [place]   LEFT  . Hyperlipidemia   . Hypertension   . [place]   . Osteoporosis    Past Surgical History:  Procedure Laterality Date  . CATARACT EXTRACTION  6/03   left  . CATARACT EXTRACTION  4/09   right  . ORIF ELBOW FRACTURE Left 04/02/2018   Procedure: OPEN REDUCTION INTERNAL FIXATION (ORIF) ELBOW/OLECRANON FRACTURE;  Surgeon: HShona Needles MD;  [place] [facility]  Service: [place]opedics;  Laterality: Left;  . ORIF HIP FRACTURE Left 04/02/2018   Procedure: OPEN REDUCTION INTERNAL FIXATION HIP;  Surgeon: HShona Needles MD;  [place] [facility]  Service: Orthopedics;  Laterality: Left;  . TUBAL LIGATION     Family History  Problem Relation Age of Onset  . Stroke Mother   . Cancer Father        stomach  . Parkinsonism Brother   . Cancer Brother  lung  . Heart disease Brother        MI  . Alzheimer's disease Sister   . Stroke Unknown   . Alzheimer's disease Unknown   . Dementia Unknown   . Parkinsonism Unknown    Social History   Socioeconomic History  . Marital status: Married    Spouse name: Not on file  . Number of children: Not on file  . Years of education: Not on file  . Highest education level: Not on file  Occupational History  . Occupation: retired Patent attorney: RETIRED  Social Needs  . Financial resource strain: Not on file  . Food insecurity:    Worry: Not on file    Inability: Not on file  . Transportation needs:    Medical: Not on file    Non-medical: Not on file  Tobacco Use  . Smoking status: Former Smoker    Packs/day: 0.50    Years: 4.00    Pack years: 2.00      Types: Cigarettes    Last attempt to quit: 12/13/1963    Years since quitting: 54.7  . Smokeless tobacco: Never Used  Substance and Sexual Activity  . Alcohol use: No  . Drug use: Never  . Sexual activity: Not Currently    Partners: Male  Lifestyle  . Physical activity:    Days per week: Not on file    Minutes per session: Not on file  . Stress: Not on file  Relationships  . Social connections:    Talks on phone: Not on file    Gets together: Not on file    Attends religious service: Not on file    Active member of club or organization: Not on file    Attends meetings of clubs or organizations: Not on file    Relationship status: Not on file  Other Topics Concern  . Not on file  Social History Narrative   Walk when weather is ok    Outpatient Encounter Medications as of 09/16/2018  Medication Sig  . amLODipine (NORVASC) 5 MG tablet Take 1 tablet (5 mg total) by mouth daily.  Marland Kitchen aspirin EC 81 MG tablet Take 81 mg by mouth daily.  . calcium carbonate (OS-CAL) 600 MG TABS Take 600 mg by mouth 2 (two) times daily with a meal.  . donepezil (ARICEPT) 5 MG tablet Take 1 tablet (5 mg total) by mouth at bedtime.  . fish oil-omega-3 fatty acids 1000 MG capsule Take 2 g by mouth 2 (two) times daily.   . Flaxseed, Linseed, (FLAXSEED OIL PO) Take 1,400 mg by mouth daily.  Marland Kitchen lisinopril (PRINIVIL,ZESTRIL) 40 MG tablet Take 1 tablet (40 mg total) by mouth daily.  . Multiple Vitamin (MULTIVITAMIN) tablet Take 1 tablet by mouth daily.  Marland Kitchen nystatin (MYCOSTATIN/NYSTOP) powder Apply topically 4 (four) times daily.  . simvastatin (ZOCOR) 40 MG tablet TAKE 1 TABLET BY MOUTH EVERY DAY AT 6 PM   No facility-administered encounter medications on file as of 09/16/2018.     Activities of Daily Living In your present state of health, do you have any difficulty performing the following activities: 09/16/2018 04/02/2018  Hearing? Y Y  Comment wearing hearing aids -  Vision? N N  Difficulty  concentrating or making decisions? Y N  Walking or climbing stairs? N Y  Dressing or bathing? N N  Doing errands, shopping? Tempie Donning  Preparing Food and eating ? N -  Using the Toilet? N -  In  the past six months, have you accidently leaked urine? Y -  Do you have problems with loss of bowel control? N -  Managing your Medications? N -  Managing your Finances? N -  Housekeeping or managing your Housekeeping? N -  Some recent data might be hidden    Patient Care Team: Carollee Herter, Alferd Apa, DO as PCP - General Dema Severin Roxy Cedar., MD (Obstetrics and Gynecology) Brunetta Genera, MD as Consulting Physician (Hematology) Jari Pigg, MD as Consulting Physician (Dermatology)    Assessment:   This is a routine wellness examination for Ysabel. Physical assessment deferred to PCP.  Exercise Activities and Dietary recommendations Current Exercise Habits: The patient does not participate in regular exercise at present, Exercise limited by: None identified Diet (meal preparation, eat out, water intake, caffeinated beverages, dairy products, fruits and vegetables): on average, 3 meals per day Breakfast: muffin or cereal. coffee Lunch: sandwich Dinner:  Meat, mac and cheese, potatoes Drinks soda and sweet tea  Goals    . Eat more fruits and vegetables    . Increase water intake    . Maintain health and independence (pt-stated)       Fall Risk Fall Risk  09/16/2018 09/10/2017 09/07/2016 09/06/2015 03/04/2015  Falls in the past year? 1 Yes No Yes Yes  Number falls in past yr: 0 1 - 1 1  Comment - - - - stepped off of a step, no injury  Injury with Fall? 1 Yes - Yes -  Risk for fall due to : Impaired balance/gait - - - -  Follow up - Falls prevention discussed;Education provided - Falls evaluation completed -  Comment - - - see visit for fall -   Depression Screen PHQ 2/9 Scores 09/16/2018 09/10/2017 09/07/2016 09/06/2015  PHQ - 2 Score 0 0 0 0     Cognitive Function MMSE - Mini  Mental State Exam 09/16/2018 09/10/2017 09/07/2016  Orientation to time 5 5 5   Orientation to Place 5 5 5   Registration 3 3 3   Attention/ Calculation 4 5 5   Recall 0 1 2  Language- name 2 objects 2 2 2   Language- repeat 1 1 1   Language- follow 3 step command 3 3 3   Language- read & follow direction 1 1 1   Write a sentence 1 1 1   Copy design 1 1 1   Total score 26 28 29         Immunization History  Administered Date(s) Administered  . Influenza Split 08/07/2011, 07/21/2012  . Influenza Whole 09/03/2007, 08/04/2008, 08/03/2009, 08/04/2010  . Influenza,inj,Quad PF,6+ Mos 07/24/2013, 07/12/2014, 07/13/2015, 07/12/2016, 07/11/2017, 07/11/2018  . Pneumococcal Polysaccharide-23 10/04/2004, 07/21/2012  . Td 10/03/2005    Screening Tests Health Maintenance  Topic Date Due  . TETANUS/TDAP  10/04/2015  . MAMMOGRAM  07/19/2017  . PNA vac Low Risk Adult (2 of 2 - PCV13) 09/05/2020 (Originally 07/21/2013)  . INFLUENZA VACCINE  Completed  . DEXA SCAN  Completed      Plan:    Please schedule your next medicare wellness visit with me in 1 yr.  Continue to eat heart healthy diet (full of fruits, vegetables, whole grains, lean protein, water--limit salt, fat, and sugar intake) and increase physical activity as tolerated.  Continue doing brain stimulating activities (puzzles, reading, adult coloring books, staying active) to keep memory sharp.     I have personally reviewed and noted the following in the patient's chart:   . Medical and social history . Use of alcohol, tobacco or illicit  drugs  . Current medications and supplements . Functional ability and status . Nutritional status . Physical activity . Advanced directives . List of other physicians . Hospitalizations, surgeries, and ER visits in previous 12 months . Vitals . Screenings to include cognitive, depression, and falls . Referrals and appointments  In addition, I have reviewed and discussed with patient certain  preventive protocols, quality metrics, and best practice recommendations. A written personalized care plan for preventive services as well as general preventive health recommendations were provided to patient.     Shela Nevin, [place]  09/16/2018

## 2018-09-16 ENCOUNTER — Encounter

## 2018-09-16 ENCOUNTER — Ambulatory Visit: Payer: Medicare Other | Admitting: Family Medicine

## 2018-09-16 ENCOUNTER — Ambulatory Visit (INDEPENDENT_AMBULATORY_CARE_PROVIDER_SITE_OTHER): Payer: Medicare Other | Admitting: *Deleted

## 2018-09-16 ENCOUNTER — Encounter: Payer: Self-pay | Admitting: *Deleted

## 2018-09-16 ENCOUNTER — Encounter: Payer: Self-pay | Admitting: Family Medicine

## 2018-09-16 VITALS — BP 139/57 | HR 69 | Temp 98.7°F | Resp 16 | Ht 62.0 in | Wt 117.0 lb

## 2018-09-16 VITALS — BP 138/60 | HR 70 | Ht 61.0 in | Wt 117.4 lb

## 2018-09-16 DIAGNOSIS — E785 Hyperlipidemia, unspecified: Secondary | ICD-10-CM

## 2018-09-16 DIAGNOSIS — I10 Essential (primary) hypertension: Secondary | ICD-10-CM

## 2018-09-16 DIAGNOSIS — Z0000 Encounter for general adult medical examination without abnormal findings: Secondary | ICD-10-CM

## 2018-09-16 DIAGNOSIS — B354 Tinea corporis: Secondary | ICD-10-CM

## 2018-09-16 MED ORDER — NAFTIFINE HCL 1 % EX CREA: TOPICAL_CREAM | Freq: Every day | CUTANEOUS | 0 refills | Status: DC

## 2018-09-16 NOTE — Progress Notes (Signed)
Reviewed   R Lowne Chase, DO  

## 2018-09-16 NOTE — Patient Instructions (Signed)
Preventive Care 65 Years and Older, Female Preventive care refers to lifestyle choices and visits with your health care provider that can promote health and wellness. What does preventive care include?  A yearly physical exam. This is also called an annual well check.  Dental exams once or twice a year.  Routine eye exams. Ask your health care provider how often you should have your eyes checked.  Personal lifestyle choices, including: ? Daily care of your teeth and gums. ? Regular physical activity. ? Eating a healthy diet. ? Avoiding tobacco and drug use. ? Limiting alcohol use. ? Practicing safe sex. ? Taking low-dose aspirin every day. ? Taking vitamin and mineral supplements as recommended by your health care provider. What happens during an annual well check? The services and screenings done by your health care provider during your annual well check will depend on your age, overall health, lifestyle risk factors, and family history of disease. Counseling Your health care provider may ask you questions about your:  Alcohol use.  Tobacco use.  Drug use.  Emotional well-being.  Home and relationship well-being.  Sexual activity.  Eating habits.  History of falls.  Memory and ability to understand (cognition).  Work and work environment.  Reproductive health.  Screening You may have the following tests or measurements:  Height, weight, and BMI.  Blood pressure.  Lipid and cholesterol levels. These may be checked every 5 years, or more frequently if you are over 50 years old.  Skin check.  Lung cancer screening. You may have this screening every year starting at age 55 if you have a 30-pack-year history of smoking and currently smoke or have quit within the past 15 years.  Fecal occult blood test (FOBT) of the stool. You may have this test every year starting at age 50.  Flexible sigmoidoscopy or colonoscopy. You may have a sigmoidoscopy every 5 years or  a colonoscopy every 10 years starting at age 50.  Hepatitis C blood test.  Hepatitis B blood test.  Sexually transmitted disease (STD) testing.  Diabetes screening. This is done by checking your blood sugar (glucose) after you have not eaten for a while (fasting). You may have this done every 1-3 years.  Bone density scan. This is done to screen for osteoporosis. You may have this done starting at age 65.  Mammogram. This may be done every 1-2 years. Talk to your health care provider about how often you should have regular mammograms.  Talk with your health care provider about your test results, treatment options, and if necessary, the need for more tests. Vaccines Your health care provider may recommend certain vaccines, such as:  Influenza vaccine. This is recommended every year.  Tetanus, diphtheria, and acellular pertussis (Tdap, Td) vaccine. You may need a Td booster every 10 years.  Varicella vaccine. You may need this if you have not been vaccinated.  Zoster vaccine. You may need this after age 60.  Measles, mumps, and rubella (MMR) vaccine. You may need at least one dose of MMR if you were born in 1957 or later. You may also need a second dose.  Pneumococcal 13-valent conjugate (PCV13) vaccine. One dose is recommended after age 65.  Pneumococcal polysaccharide (PPSV23) vaccine. One dose is recommended after age 65.  Meningococcal vaccine. You may need this if you have certain conditions.  Hepatitis A vaccine. You may need this if you have certain conditions or if you travel or work in places where you may be exposed to hepatitis   A.  Hepatitis B vaccine. You may need this if you have certain conditions or if you travel or work in places where you may be exposed to hepatitis B.  Haemophilus influenzae type b (Hib) vaccine. You may need this if you have certain conditions.  Talk to your health care provider about which screenings and vaccines you need and how often you  need them. This information is not intended to replace advice given to you by your health care provider. Make sure you discuss any questions you have with your health care provider. Document Released: 11/04/2015 Document Revised: 06/27/2016 Document Reviewed: 08/09/2015 Elsevier Interactive Patient Education  2018 [employer]  

## 2018-09-16 NOTE — Patient Instructions (Signed)
Please schedule your next medicare wellness visit with me in 1 yr.  Continue to eat heart healthy diet (full of fruits, vegetables, whole grains, lean protein, water--limit salt, fat, and sugar intake) and increase physical activity as tolerated.  Continue doing brain stimulating activities (puzzles, reading, adult coloring books, staying active) to keep memory sharp.    Lindsey Patel , Thank you for taking time to come for your Medicare Wellness Visit. I appreciate your ongoing commitment to your health goals. Please review the following plan we discussed and let me know if I can assist you in the future.   These are the goals we discussed: Goals    . Eat more fruits and vegetables    . Increase water intake    . Maintain health and independence (pt-stated)       This is a list of the screening recommended for you and due dates:  Health Maintenance  Topic Date Due  . Tetanus Vaccine  10/04/2015  . Mammogram  07/19/2017  . Pneumonia vaccines (2 of 2 - PCV13) 09/05/2020*  . Flu Shot  Completed  . DEXA scan (bone density measurement)  Completed  *Topic was postponed. The date shown is not the original due date.    Health Maintenance for Postmenopausal Women Menopause is a normal process in which your reproductive ability comes to an end. This process happens gradually over a span of months to years, usually between the ages of 45 and 67. Menopause is complete when you have missed 12 consecutive menstrual periods. It is important to talk with your health care provider about some of the most common conditions that affect postmenopausal women, such as heart disease, cancer, and bone loss (osteoporosis). Adopting a healthy lifestyle and getting preventive care can help to promote your health and wellness. Those actions can also lower your chances of developing some of these common conditions. What should I know about menopause? During menopause, you may experience a number of symptoms, such  as:  Moderate-to-severe hot flashes.  Night sweats.  Decrease in sex drive.  Mood swings.  Headaches.  Tiredness.  Irritability.  Memory problems.  Insomnia.  Choosing to treat or not to treat menopausal changes is an individual decision that you make with your health care provider. What should I know about hormone replacement therapy and supplements? Hormone therapy products are effective for treating symptoms that are associated with menopause, such as hot flashes and night sweats. Hormone replacement carries certain risks, especially as you become older. If you are thinking about using estrogen or estrogen with progestin treatments, discuss the benefits and risks with your health care provider. What should I know about heart disease and stroke? Heart disease, heart attack, and stroke become more likely as you age. This may be due, in part, to the hormonal changes that your body experiences during menopause. These can affect how your body processes dietary fats, triglycerides, and cholesterol. Heart attack and stroke are both medical emergencies. There are many things that you can do to help prevent heart disease and stroke:  Have your blood pressure checked at least every 1-2 years. High blood pressure causes heart disease and increases the risk of stroke.  If you are 29-64 years old, ask your health care provider if you should take aspirin to prevent a heart attack or a stroke.  Do not use any tobacco products, including cigarettes, chewing tobacco, or electronic cigarettes. If you need help quitting, ask your health care provider.  It is important to  eat a healthy diet and maintain a healthy weight. ? Be sure to include plenty of vegetables, fruits, low-fat dairy products, and lean protein. ? Avoid eating foods that are high in solid fats, added sugars, or salt (sodium).  Get regular exercise. This is one of the most important things that you can do for your health. ? Try  to exercise for at least 150 minutes each week. The type of exercise that you do should increase your heart rate and make you sweat. This is known as moderate-intensity exercise. ? Try to do strengthening exercises at least twice each week. Do these in addition to the moderate-intensity exercise.  Know your numbers.Ask your health care provider to check your cholesterol and your blood glucose. Continue to have your blood tested as directed by your health care provider.  What should I know about cancer screening? There are several types of cancer. Take the following steps to reduce your risk and to catch any cancer development as early as possible. Breast Cancer  Practice breast self-awareness. ? This means understanding how your breasts normally appear and feel. ? It also means doing regular breast self-exams. Let your health care provider know about any changes, no matter how small.  If you are 72 or older, have a clinician do a breast exam (clinical breast exam or CBE) every year. Depending on your age, family history, and medical history, it may be recommended that you also have a yearly breast X-ray (mammogram).  If you have a family history of breast cancer, talk with your health care provider about genetic screening.  If you are at high risk for breast cancer, talk with your health care provider about having an MRI and a mammogram every year.  Breast cancer (BRCA) gene test is recommended for women who have family members with BRCA-related cancers. Results of the assessment will determine the need for genetic counseling and BRCA1 and for BRCA2 testing. BRCA-related cancers include these types: ? Breast. This occurs in males or females. ? Ovarian. ? Tubal. This may also be called fallopian tube cancer. ? Cancer of the abdominal or pelvic lining (peritoneal cancer). ? Prostate. ? Pancreatic.  Cervical, Uterine, and Ovarian Cancer Your health care provider may recommend that you be  screened regularly for cancer of the pelvic organs. These include your ovaries, uterus, and vagina. This screening involves a pelvic exam, which includes checking for microscopic changes to the surface of your cervix (Pap test).  For women ages 21-65, health care providers may recommend a pelvic exam and a Pap test every three years. For women ages 35-65, they may recommend the Pap test and pelvic exam, combined with testing for human papilloma virus (HPV), every five years. Some types of HPV increase your risk of cervical cancer. Testing for HPV may also be done on women of any age who have unclear Pap test results.  Other health care providers may not recommend any screening for nonpregnant women who are considered low risk for pelvic cancer and have no symptoms. Ask your health care provider if a screening pelvic exam is right for you.  If you have had past treatment for cervical cancer or a condition that could lead to cancer, you need Pap tests and screening for cancer for at least 20 years after your treatment. If Pap tests have been discontinued for you, your risk factors (such as having a new sexual partner) need to be reassessed to determine if you should start having screenings again. Some women have  medical problems that increase the chance of getting cervical cancer. In these cases, your health care provider may recommend that you have screening and Pap tests more often.  If you have a family history of uterine cancer or ovarian cancer, talk with your health care provider about genetic screening.  If you have vaginal bleeding after reaching menopause, tell your health care provider.  There are currently no reliable tests available to screen for ovarian cancer.  Lung Cancer Lung cancer screening is recommended for adults 27-24 years old who are at high risk for lung cancer because of a history of smoking. A yearly low-dose CT scan of the lungs is recommended if you:  Currently  smoke.  Have a history of at least 30 pack-years of smoking and you currently smoke or have quit within the past 15 years. A pack-year is smoking an average of one pack of cigarettes per day for one year.  Yearly screening should:  Continue until it has been 15 years since you quit.  Stop if you develop a health problem that would prevent you from having lung cancer treatment.  Colorectal Cancer  This type of cancer can be detected and can often be prevented.  Routine colorectal cancer screening usually begins at age 105 and continues through age 110.  If you have risk factors for colon cancer, your health care provider may recommend that you be screened at an earlier age.  If you have a family history of colorectal cancer, talk with your health care provider about genetic screening.  Your health care provider may also recommend using home test kits to check for hidden blood in your stool.  A small camera at the end of a tube can be used to examine your colon directly (sigmoidoscopy or colonoscopy). This is done to check for the earliest forms of colorectal cancer.  Direct examination of the colon should be repeated every 5-10 years until age 96. However, if early forms of precancerous polyps or small growths are found or if you have a family history or genetic risk for colorectal cancer, you may need to be screened more often.  Skin Cancer  Check your skin from head to toe regularly.  Monitor any moles. Be sure to tell your health care provider: ? About any new moles or changes in moles, especially if there is a change in a mole's shape or color. ? If you have a mole that is larger than the size of a pencil eraser.  If any of your family members has a history of skin cancer, especially at a young age, talk with your health care provider about genetic screening.  Always use sunscreen. Apply sunscreen liberally and repeatedly throughout the day.  Whenever you are outside, protect  yourself by wearing long sleeves, pants, a wide-brimmed hat, and sunglasses.  What should I know about osteoporosis? Osteoporosis is a condition in which bone destruction happens more quickly than new bone creation. After menopause, you may be at an increased risk for osteoporosis. To help prevent osteoporosis or the bone fractures that can happen because of osteoporosis, the following is recommended:  If you are 28-75 years old, get at least 1,000 mg of calcium and at least 600 mg of vitamin D per day.  If you are older than age 68 but younger than age 29, get at least 1,200 mg of calcium and at least 600 mg of vitamin D per day.  If you are older than age 99, get at least 26,200  mg of calcium and at least 800 mg of vitamin D per day.  Smoking and excessive alcohol intake increase the risk of osteoporosis. Eat foods that are rich in calcium and vitamin D, and do weight-bearing exercises several times each week as directed by your health care provider. What should I know about how menopause affects my mental health? Depression may occur at any age, but it is more common as you become older. Common symptoms of depression include:  Low or sad mood.  Changes in sleep patterns.  Changes in appetite or eating patterns.  Feeling an overall lack of motivation or enjoyment of activities that you previously enjoyed.  Frequent crying spells.  Talk with your health care provider if you think that you are experiencing depression. What should I know about immunizations? It is important that you get and maintain your immunizations. These include:  Tetanus, diphtheria, and pertussis (Tdap) booster vaccine.  Influenza every year before the flu season begins.  Pneumonia vaccine.  Shingles vaccine.  Your health care provider may also recommend other immunizations. This information is not intended to replace advice given to you by your health care provider. Make sure you discuss any questions you  have with your health care provider. Document Released: 11/30/2005 Document Revised: 04/27/2016 Document Reviewed: 07/12/2015 Elsevier Interactive Patient Education  2018 [employer].

## 2018-09-16 NOTE — Progress Notes (Signed)
Subjective:     Lindsey Patel is a 84 y.o. female and is here for a comprehensive physical exam. The patient reports no new problems.------  dementia slowly declining .  Social History   Socioeconomic History  . Marital status: Married    Spouse name: Not on file  . Number of children: Not on file  . Years of education: Not on file  . Highest education level: Not on file  Occupational History  . Occupation: retired Patent attorney: RETIRED  Social Needs  . Financial resource strain: Not on file  . Food insecurity:    Worry: Not on file    Inability: Not on file  . Transportation needs:    Medical: Not on file    Non-medical: Not on file  Tobacco Use  . Smoking status: Former Smoker    Packs/day: 0.50    Years: 4.00    Pack years: 2.00    Types: Cigarettes    Last attempt to quit: 12/13/1963    Years since quitting: 54.7  . Smokeless tobacco: Never Used  Substance and Sexual Activity  . Alcohol use: No  . Drug use: Never  . Sexual activity: Not Currently    Partners: Male  Lifestyle  . Physical activity:    Days per week: Not on file    Minutes per session: Not on file  . Stress: Not on file  Relationships  . Social connections:    Talks on phone: Not on file    Gets together: Not on file    Attends religious service: Not on file    Active member of club or organization: Not on file    Attends meetings of clubs or organizations: Not on file    Relationship status: Not on file  . Intimate partner violence:    Fear of current or ex partner: Not on file    Emotionally abused: Not on file    Physically abused: Not on file    Forced sexual activity: Not on file  Other Topics Concern  . Not on file  Social History Narrative   Walk when weather is ok   Health Maintenance  Topic Date Due  . TETANUS/TDAP  10/04/2015  . MAMMOGRAM  07/19/2017  . PNA vac Low Risk Adult (2 of 2 - PCV13) 09/05/2020 (Originally 07/21/2013)  . INFLUENZA VACCINE  Completed   . DEXA SCAN  Completed    The following portions of the patient's history were reviewed and updated as appropriate: She  has a past medical history of Chronic lymphocytic leukemia (09/22/2007), Fracture of elbow (04/01/2018), Fracture of hip, closed ([place]) (04/01/2018), Hyperlipidemia, Hypertension, Leukemia ([place]), and Osteoporosis. She does not have any pertinent problems on file. She  has a past surgical history that includes Tubal ligation; Cataract extraction (6/03); Cataract extraction (4/09); ORIF elbow fracture (Left, 04/02/2018); and ORIF hip fracture (Left, 04/02/2018). Her family history includes Alzheimer's disease in her sister and unknown relative; Cancer in her brother and father; Dementia in her unknown relative; Heart disease in her brother; Parkinsonism in her brother and unknown relative; Stroke in her mother and unknown relative. She  reports that she quit smoking about 54 years ago. Her smoking use included cigarettes. She has a 2.00 pack-year smoking history. She has never used smokeless tobacco. She reports that she does not drink alcohol or use drugs. She has a current medication list which includes the following prescription(s): amlodipine, aspirin ec, calcium carbonate, donepezil, fish oil-omega-3 fatty acids,  flaxseed (linseed), lisinopril, multivitamin, nystatin, simvastatin, and naftifine. Current Outpatient Medications on File Prior to Visit  Medication Sig Dispense Refill  . amLODipine (NORVASC) 5 MG tablet Take 1 tablet (5 mg total) by mouth daily. 90 tablet 1  . aspirin EC 81 MG tablet Take 81 mg by mouth daily.    . calcium carbonate (OS-CAL) 600 MG TABS Take 600 mg by mouth 2 (two) times daily with a meal.    . donepezil (ARICEPT) 5 MG tablet Take 1 tablet (5 mg total) by mouth at bedtime. 30 tablet 5  . fish oil-omega-3 fatty acids 1000 MG capsule Take 2 g by mouth 2 (two) times daily.     . Flaxseed, Linseed, (FLAXSEED OIL PO) Take 1,400 mg by mouth daily.    Marland Kitchen  lisinopril (PRINIVIL,ZESTRIL) 40 MG tablet Take 1 tablet (40 mg total) by mouth daily. 90 tablet 3  . Multiple Vitamin (MULTIVITAMIN) tablet Take 1 tablet by mouth daily.    Marland Kitchen nystatin (MYCOSTATIN/NYSTOP) powder Apply topically 4 (four) times daily. 15 g 0  . simvastatin (ZOCOR) 40 MG tablet TAKE 1 TABLET BY MOUTH EVERY DAY AT 6 PM 90 tablet 1   No current facility-administered medications on file prior to visit.    She is allergic to pneumococcal vaccines..  Review of Systems Review of Systems  Constitutional: Negative for activity change, appetite change and fatigue.  HENT: Negative for hearing loss, congestion, tinnitus and ear discharge.  dentist q33m Eyes: Negative for visual disturbance (see optho q1y -- vision corrected to 20/20 with glasses).  Respiratory: Negative for cough, chest tightness and shortness of breath.   Cardiovascular: Negative for chest pain, palpitations and leg swelling.  Gastrointestinal: Negative for abdominal pain, diarrhea, constipation and abdominal distention.  Genitourinary: Negative for urgency, frequency, decreased urine volume and difficulty urinating.  Musculoskeletal: Negative for back pain, arthralgias and gait problem.  Skin: Negative for color change, pallor and rash.  Neurological: Negative for dizziness, light-headedness, numbness and headaches.  Hematological: Negative for adenopathy. Does not bruise/bleed easily.  Psychiatric/Behavioral: Negative for suicidal ideas, confusion, sleep disturbance, self-injury, dysphoric mood, decreased concentration and agitation.       Objective:    BP (!) 139/57 (BP Location: Left Arm, Patient Position: Sitting, Cuff Size: Small)   Pulse 69   Temp 98.7 F (37.1 C) (Oral)   Resp 16   Ht 5\' 2"  (1.575 m)   Wt 117 lb (53.1 kg)   SpO2 99%   BMI 21.40 kg/m  General appearance: alert, cooperative, appears stated age and no distress Head: Normocephalic, without obvious abnormality, atraumatic Eyes:  conjunctivae/corneas clear. PERRL, EOM's intact. Fundi benign. Ears: normal TM's and external ear canals both ears Nose: Nares normal. Septum midline. Mucosa normal. No drainage or sinus tenderness. Throat: lips, mucosa, and tongue normal; teeth and gums normal Neck: no adenopathy, no carotid bruit, no JVD, supple, symmetrical, trachea midline and thyroid not enlarged, symmetric, no tenderness/mass/nodules Lungs: clear to auscultation bilaterally Breasts: normal appearance, no masses or tenderness Heart: regular rate and rhythm, S1, S2 normal, no murmur, click, rub or gallop Abdomen: soft, non-tender; bowel sounds normal; no masses,  no organomegaly Pelvic: not indicated; post-menopausal, no abnormal Pap smears in past Extremities: extremities normal, atraumatic, no cyanosis or edema Pulses: 2+ and symmetric Skin: Skin color, texture, turgor normal. No rashes or lesions Lymph nodes: Cervical, supraclavicular, and axillary nodes normal. Neurologic: Alert and oriented X 3, normal strength and tone. Normal symmetric reflexes. Normal coordination and gait  Assessment:    Healthy female exam.      Plan:    ghm utd-- needs shingrix and mammogram  See After Visit Summary for Counseling Recommendations    1. Tinea corporis  - naftifine (NAFTIN) 1 % cream; Apply topically daily.  Dispense: 30 g; Refill: 0  2. Essential hypertension Well controlled, no changes to meds. Encouraged heart healthy diet such as the DASH diet and exercise as tolerated.   - Lipid panel Well controlled, no changes to meds. Encouraged heart healthy diet such as the DASH diet and exercise as tolerated.  - CBC with Differential/Platelet - Comprehensive metabolic panel  3. Hyperlipidemia, unspecified hyperlipidemia type Tolerating statin, encouraged heart healthy diet, avoid trans fats, minimize simple carbs and saturated fats. Increase exercise as tolerated - Lipid panel - Comprehensive metabolic panel  4.  Preventative health care See above

## 2018-11-05 ENCOUNTER — Ambulatory Visit: Payer: Medicare Other | Admitting: Neurology

## 2018-11-05 ENCOUNTER — Encounter

## 2018-12-09 ENCOUNTER — Telehealth: Payer: Self-pay | Admitting: *Deleted

## 2018-12-09 DIAGNOSIS — R413 Other amnesia: Secondary | ICD-10-CM

## 2018-12-09 NOTE — Telephone Encounter (Signed)
Copied from [place] [id number]. Topic: Referral - Request for Referral >> Dec 08, 2018 10:20 AM Yvette Rack wrote: Has patient seen PCP for this complaint? yes  *If NO, is insurance requiring patient see PCP for this issue before PCP can refer them? Referral for which specialty: Neurology Preferred provider/office: no specific provider/[place] or [place] area Reason for referral: Pt request a 2nd opinion - Prefers to see another provider

## 2018-12-10 ENCOUNTER — Other Ambulatory Visit: Payer: Self-pay | Admitting: Family Medicine

## 2018-12-10 NOTE — Telephone Encounter (Signed)
Neuro ?  For memory loss?  ---- does she have a specific place she wants to go?   Does she want to stay local ---- or go to [facility]?

## 2018-12-10 NOTE — Telephone Encounter (Signed)
Please advise 

## 2018-12-12 NOTE — Telephone Encounter (Signed)
Left message on machine for daughterto call back  [place] for pec to discuss

## 2018-12-12 NOTE — Telephone Encounter (Signed)
Lindsey Patel, Lindsey Patel 12/12/2018 11:07 AM  Pt daughter returned call. Pt requests referral to a Neurologist for memory loss and cognitive impairment. Pt would like to be referred to a Neurologist within [facility] if possible. Pt daughter stated that they just would like a better experience than the one they had with [facility].

## 2018-12-12 NOTE — Telephone Encounter (Signed)
Referral placed for  neuro

## 2018-12-15 ENCOUNTER — Encounter: Payer: Self-pay | Admitting: Neurology

## 2019-02-06 ENCOUNTER — Other Ambulatory Visit: Payer: Self-pay | Admitting: Family Medicine

## 2019-02-06 DIAGNOSIS — R413 Other amnesia: Secondary | ICD-10-CM

## 2019-02-16 ENCOUNTER — Other Ambulatory Visit: Payer: Self-pay | Admitting: Family Medicine

## 2019-02-16 DIAGNOSIS — I10 Essential (primary) hypertension: Secondary | ICD-10-CM

## 2019-02-27 ENCOUNTER — Ambulatory Visit: Payer: Medicare Other | Admitting: Neurology

## 2019-03-23 ENCOUNTER — Telehealth: Payer: Self-pay | Admitting: Neurology

## 2019-03-23 NOTE — Telephone Encounter (Signed)
Pt's daughter Zacarias Pontes called, she wants to know if we received pt's records from a previous neurologist. Pls call her.

## 2019-03-23 NOTE — Telephone Encounter (Signed)
Pt's daughter Lindsey Patel returned your call. Pls call her again at [phone].

## 2019-03-23 NOTE — Telephone Encounter (Signed)
Pt daughter called no answer voice mail left for her to call back  

## 2019-03-23 NOTE — Telephone Encounter (Signed)
Pt daughter called back no answer voice mail left

## 2019-03-24 NOTE — Telephone Encounter (Signed)
Called pt daughter back, questions for Dr. Delice Lesch, sending for records, pt daughter will send over some information, will call Lora back on Friday

## 2019-03-24 NOTE — Telephone Encounter (Signed)
Pt's daughter Zacarias Pontes [place] left a message asking for a call back, she stated that she will do her best to answer this time. Her phone is [phone].

## 2019-03-27 NOTE — Telephone Encounter (Signed)
Spoke with pt daughter informed her that we have the paper work that was dropped off, and that we have information from care everywhere from [facility], pt daughter stated that her mom was 50/50 on if she was going to come to her appointment on the 17th, pt has an eye dr appointment on Monday pt daughter stated she would call us back and let us know how her vision is.

## 2019-04-08 ENCOUNTER — Ambulatory Visit: Payer: Medicare Other | Admitting: Neurology

## 2019-06-25 ENCOUNTER — Encounter: Payer: Self-pay | Admitting: Family Medicine

## 2019-06-25 ENCOUNTER — Other Ambulatory Visit: Payer: Self-pay | Admitting: Family Medicine

## 2019-07-01 MED ORDER — LISINOPRIL 40 MG PO TABS: 40.0000 mg | ORAL_TABLET | Freq: Every day | ORAL | 0 refills | Status: DC

## 2019-07-02 ENCOUNTER — Encounter: Payer: Self-pay | Admitting: Hematology

## 2019-07-09 NOTE — Progress Notes (Signed)
[facility]  HEMATOLOGY ONCOLOGY PROGRESS NOTE  Date of service:   07/10/19    Patient Care Team: Carollee Herter, Alferd Apa, DO as PCP - General Dema Severin Roxy Cedar., MD (Obstetrics and Gynecology) Brunetta Genera, MD as Consulting Physician (Hematology) Jari Pigg, MD as Consulting Physician (Dermatology)  CC: Follow-up for CLL  Diagnosis: Rai Stage 0 CLL  Current Treatment: Monitoring/Observation.   INTERVAL HISTORY: [name] is here for management and evaluation of her CLL. The patient's last visit with [place] was on 07/11/2018. She is accompanied by her son. The pt reports that she is doing well overall.  The pt reports that she enjoys cheese. She has retina reattachment surgery in March, which unfortunately did not take and she had to undergo surgery again to correct it. She is unsure if she is up to date with her pneumonia vaccinees.   Lab results today (07/10/19) of CBC w/diff and CMP is as follows: all values are WNL except for Total protein at 6.3, WBC at 14.6K, and Lymphs Abs at 9.7. 07/10/19 LDH at 211.   On review of systems, pt reports good appetite and denies new lumps and bumps, abdominal pain, and any other symptoms.    REVIEW OF SYSTEMS:   A 10+ POINT REVIEW OF SYSTEMS WAS OBTAINED including neurology, dermatology, psychiatry, cardiac, respiratory, lymph, extremities, GI, GU, Musculoskeletal, constitutional, breasts, reproductive, HEENT.  All pertinent positives are noted in the HPI.  All others are negative.    . Past Medical History:  Diagnosis Date  . Chronic lymphocytic leukemia 09/22/2007   Qualifier: Diagnosis of  By: Jerold Coombe    . Fracture of elbow 04/01/2018  . Fracture of hip, closed ([place]) 04/01/2018   LEFT  . Hyperlipidemia   . Hypertension   . Leukemia ([place])   . Osteoporosis     . Past Surgical History:  Procedure Laterality Date  . CATARACT EXTRACTION  6/03   left  . CATARACT EXTRACTION  4/09   right  . ORIF ELBOW FRACTURE Left  04/02/2018   Procedure: OPEN REDUCTION INTERNAL FIXATION (ORIF) ELBOW/OLECRANON FRACTURE;  Surgeon: Shona Needles, MD;  Location: [facility];  Service: Orthopedics;  Laterality: Left;  . ORIF HIP FRACTURE Left 04/02/2018   Procedure: OPEN REDUCTION INTERNAL FIXATION HIP;  Surgeon: Shona Needles, MD;  Location: [facility];  Service: Orthopedics;  Laterality: Left;  . TUBAL LIGATION      . Social History   Tobacco Use  . Smoking status: Former Smoker    Packs/day: 0.50    Years: 4.00    Pack years: 2.00    Types: Cigarettes    Quit date: 12/13/1963    Years since quitting: 55.6  . Smokeless tobacco: Never Used  Substance Use Topics  . Alcohol use: No  . Drug use: Never    ALLERGIES:  is allergic to pneumococcal vaccines.  MEDICATIONS:  Current Outpatient Medications  Medication Sig Dispense Refill  . amLODipine (NORVASC) 5 MG tablet TAKE 1 TABLET(5 MG) BY MOUTH DAILY 90 tablet 1  . aspirin EC 81 MG tablet Take 81 mg by mouth daily.    . calcium carbonate (OS-CAL) 600 MG TABS Take 600 mg by mouth 2 (two) times daily with a meal.    . donepezil (ARICEPT) 5 MG tablet TAKE 1 TABLET(5 MG) BY MOUTH AT BEDTIME 90 tablet 1  . fish oil-omega-3 fatty acids 1000 MG capsule Take 2 g by mouth 2 (two) times daily.     . Flaxseed,  Linseed, (FLAXSEED OIL PO) Take 1,400 mg by mouth daily.    Marland Kitchen lisinopril (ZESTRIL) 40 MG tablet Take 1 tablet (40 mg total) by mouth daily. 90 tablet 0  . Multiple Vitamin (MULTIVITAMIN) tablet Take 1 tablet by mouth daily.    . naftifine (NAFTIN) 1 % cream Apply topically daily. 30 g 0  . nystatin (MYCOSTATIN/NYSTOP) powder Apply topically 4 (four) times daily. 15 g 0  . simvastatin (ZOCOR) 40 MG tablet TAKE 1 TABLET BY MOUTH EVERY DAY AT 6 PM 90 tablet 1   No current facility-administered medications for this visit.     PHYSICAL EXAMINATION: ECOG FS:2 - Symptomatic, <50% confined to bed  Vitals:   07/10/19 1110  BP: (!) 180/67  Pulse: 67  Resp: 18  Temp: 98.3 F  (36.8 C)  SpO2: 98%   Wt Readings from Last 3 Encounters:  09/16/18 117 lb (53.1 kg)  09/16/18 117 lb 6.4 oz (53.3 kg)  08/14/18 116 lb 9.6 oz (52.9 kg)   Body mass index is 21.4 kg/m.    GENERAL:alert, in no acute distress and comfortable SKIN: no acute rashes, no significant lesions EYES: conjunctiva are pink and non-injected, sclera anicteric OROPHARYNX: MMM, no exudates, no oropharyngeal erythema or ulceration NECK: supple, no JVD LYMPH:  no palpable lymphadenopathy in the cervical, axillary or inguinal regions LUNGS: clear to auscultation b/l with normal respiratory effort HEART: regular rate & rhythm ABDOMEN:  normoactive bowel sounds , non tender, not distended. Extremity: no pedal edema PSYCH: alert & oriented x 3 with fluent speech NEURO: no focal motor/sensory deficits   LABORATORY DATA:   I have reviewed the data as listed  . CBC Latest Ref Rng & Units 07/10/2019 08/14/2018 07/11/2018  WBC 4.0 - 10.5 K/uL 14.6(H) 13.4(H) 12.1(H)  Hemoglobin 12.0 - 15.0 g/dL 13.6 13.4 13.6  Hematocrit 36.0 - 46.0 % 40.7 40.6 41.4  Platelets 150 - 400 K/uL 189 242.0 216   . CBC    Component Value Date/Time   WBC 14.6 (H) 07/10/2019 0950   RBC 4.35 07/10/2019 0950   HGB 13.6 07/10/2019 0950   HGB 13.6 07/11/2018 0956   HGB 13.6 07/11/2017 1035   HCT 40.7 07/10/2019 0950   HCT 41.0 07/11/2017 1035   PLT 189 07/10/2019 0950   PLT 216 07/11/2018 0956   PLT 208 07/11/2017 1035   MCV 93.6 07/10/2019 0950   MCV 93.0 07/11/2017 1035   MCH 31.3 07/10/2019 0950   MCHC 33.4 07/10/2019 0950   RDW 13.0 07/10/2019 0950   RDW 13.1 07/11/2017 1035   LYMPHSABS 9.7 (H) 07/10/2019 0950   LYMPHSABS 10.2 (H) 07/11/2017 1035   MONOABS 0.9 07/10/2019 0950   MONOABS 0.7 07/11/2017 1035   EOSABS 0.3 07/10/2019 0950   EOSABS 0.4 07/11/2017 1035   BASOSABS 0.1 07/10/2019 0950   BASOSABS 0.1 07/11/2017 1035    . CMP Latest Ref Rng & Units 07/10/2019 08/14/2018 07/11/2018  Glucose 70 - 99  mg/dL 80 95 97  BUN 8 - 23 mg/dL 9 10 7(L)  Creatinine 0.44 - 1.00 mg/dL 0.61 0.52 0.62  Sodium 135 - 145 mmol/L 139 135 140  Potassium 3.5 - 5.1 mmol/L 4.1 4.9 4.2  Chloride 98 - 111 mmol/L 103 98 104  CO2 22 - 32 mmol/L 28 28 27   Calcium 8.9 - 10.3 mg/dL 9.4 9.7 10.1  Total Protein 6.5 - 8.1 g/dL 6.3(L) 6.4 6.9  Total Bilirubin 0.3 - 1.2 mg/dL 0.5 0.4 0.3  Alkaline Phos 38 - 126  U/L 60 80 84  AST 15 - 41 U/L 19 21 19   ALT 0 - 44 U/L 12 16 10    . Lab Results  Component Value Date   LDH 211 (H) 07/10/2019     flow cytometry 07/13/2015 :       RADIOGRAPHIC STUDIES: I have personally reviewed the radiological images as listed and agreed with the findings in the report. No results found.  ASSESSMENT & PLAN:    84 y.o. 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 No lymphadenopathy. No hepatosplenomegaly. No cytopenias. No constitutional symptoms. -CLL prognostic fish panel sent out- showed 13q deletion which is in keeping with indolent course the patient has had an typically suggests good prognosis. Patient's CBC today shows fairly stable lymphocyte count with no significant cytopenias. LDH is within normal limits.   PLAN: -Discussed pt labwork today, 07/10/19; all values are WNL except for Total protein at 6.3, WBC at 14.6K, and Lymphs Abs at 9.7. 07/10/19 LDH at 211.  -She will receive her flu shot today.  -Discussed the importance of receiving pneumonia vaccines; she will follow up with her PCP to ensure she is on track with this.  -no clear evidence of symptomatic CLL progression at this time to warranted treatment considerations. -Follow up in 12 months with labs   FOLLOW UP: -Flu shot today -RTC with Dr Irene Limbo with labs in 12 months   The total time spent in the appt was 20 minutes and more than 50% was on counseling and direct patient cares.  All of the patient's questions were answered with apparent satisfaction.  The patient knows to call the clinic with any problems, questions or concerns.    Sullivan Lone MD MS AAHIVMS [facility] Hematology/Oncology Physician [facility]  (Office):       [phone] (Work cell):  [phone] (Fax):           [phone]  I, Jacqualyn Posey, am acting as a scribe for Dr. Sullivan Lone.   .I have reviewed the above documentation for accuracy and completeness, and I agree with the above. Brunetta Genera MD

## 2019-07-10 ENCOUNTER — Other Ambulatory Visit: Payer: Self-pay

## 2019-07-10 ENCOUNTER — Inpatient Hospital Stay (HOSPITAL_BASED_OUTPATIENT_CLINIC_OR_DEPARTMENT_OTHER): Payer: Medicare Other | Admitting: Hematology

## 2019-07-10 ENCOUNTER — Ambulatory Visit: Payer: Medicare Other | Admitting: Hematology

## 2019-07-10 ENCOUNTER — Inpatient Hospital Stay: Payer: Medicare Other | Attending: Hematology

## 2019-07-10 VITALS — BP 180/67 | HR 67 | Temp 98.3°F | Resp 18 | Ht 62.0 in

## 2019-07-10 DIAGNOSIS — Z23 Encounter for immunization: Secondary | ICD-10-CM

## 2019-07-10 DIAGNOSIS — C9110 Chronic lymphocytic leukemia of B-cell type not having achieved remission: Secondary | ICD-10-CM | POA: Diagnosis not present

## 2019-07-10 LAB — CMP (CANCER CENTER ONLY)
ALT: 12 U/L (ref 0–44)
AST: 19 U/L (ref 15–41)
Albumin: 4 g/dL (ref 3.5–5.0)
Alkaline Phosphatase: 60 U/L (ref 38–126)
Anion gap: 8 (ref 5–15)
BUN: 9 mg/dL (ref 8–23)
CO2: 28 mmol/L (ref 22–32)
Calcium: 9.4 mg/dL (ref 8.9–10.3)
Chloride: 103 mmol/L (ref 98–111)
Creatinine: 0.61 mg/dL (ref 0.44–1.00)
GFR, Est AFR Am: >60 mL/min (ref >60)
GFR, Estimated: >60 mL/min (ref >60)
Glucose, Bld: 80 mg/dL (ref 70–99)
Potassium: 4.1 mmol/L (ref 3.5–5.1)
Sodium: 139 mmol/L (ref 135–145)
Total Bilirubin: 0.5 mg/dL (ref 0.3–1.2)
Total Protein: 6.3 g/dL — ABNORMAL LOW (ref 6.5–8.1)

## 2019-07-10 LAB — CBC WITH DIFFERENTIAL/PLATELET
Abs Immature Granulocytes: 0.03 10*3/uL (ref 0.00–0.07)
Basophils Absolute: 0.1 10*3/uL (ref 0.0–0.1)
Basophils Relative: 0 %
Eosinophils Absolute: 0.3 10*3/uL (ref 0.0–0.5)
Eosinophils Relative: 2 %
HCT: 40.7 % (ref 36.0–46.0)
Hemoglobin: 13.6 g/dL (ref 12.0–15.0)
Immature Granulocytes: 0 %
Lymphocytes Relative: 67 %
Lymphs Abs: 9.7 10*3/uL — ABNORMAL HIGH (ref 0.7–4.0)
MCH: 31.3 pg (ref 26.0–34.0)
MCHC: 33.4 g/dL (ref 30.0–36.0)
MCV: 93.6 fL (ref 80.0–100.0)
Monocytes Absolute: 0.9 10*3/uL (ref 0.1–1.0)
Monocytes Relative: 6 %
Neutro Abs: 3.6 10*3/uL (ref 1.7–7.7)
Neutrophils Relative %: 25 %
Platelets: 189 10*3/uL (ref 150–400)
RBC: 4.35 MIL/uL (ref 3.87–5.11)
RDW: 13 % (ref 11.5–15.5)
WBC: 14.6 10*3/uL — ABNORMAL HIGH (ref 4.0–10.5)
nRBC: 0 % (ref 0.0–0.2)

## 2019-07-10 LAB — LACTATE DEHYDROGENASE: LDH: 211 U/L — ABNORMAL HIGH (ref 98–192)

## 2019-07-10 MED ORDER — INFLUENZA VAC A&B SA ADJ QUAD 0.5 ML IM PRSY
PREFILLED_SYRINGE | INTRAMUSCULAR | Status: AC
  Filled 2019-07-10: qty 0.5

## 2019-07-10 MED ORDER — INFLUENZA VAC A&B SA ADJ QUAD 0.5 ML IM PRSY
0.5000 mL | PREFILLED_SYRINGE | Freq: Once | INTRAMUSCULAR | Status: AC
  Administered 2019-07-10: 0.5 mL via INTRAMUSCULAR

## 2019-07-13 ENCOUNTER — Telehealth: Payer: Self-pay | Admitting: Hematology

## 2019-07-13 NOTE — Telephone Encounter (Signed)
Scheduled appt per 9/18 los.  Left a voice message of appt date and time

## 2019-07-22 ENCOUNTER — Ambulatory Visit: Payer: Medicare Other | Admitting: Neurology

## 2019-07-29 ENCOUNTER — Other Ambulatory Visit: Payer: Self-pay | Admitting: *Deleted

## 2019-07-29 ENCOUNTER — Telehealth: Payer: Self-pay | Admitting: *Deleted

## 2019-07-29 DIAGNOSIS — R413 Other amnesia: Secondary | ICD-10-CM

## 2019-07-29 MED ORDER — DONEPEZIL HCL 5 MG PO TABS: ORAL_TABLET | ORAL | 0 refills | Status: DC

## 2019-07-29 NOTE — Telephone Encounter (Signed)
Left message on machine for daughter to call back to schedule appointment.  Last seen 09/16/18 and was suppose to follow up in May.

## 2019-08-07 ENCOUNTER — Other Ambulatory Visit: Payer: Self-pay | Admitting: *Deleted

## 2019-08-07 DIAGNOSIS — I10 Essential (primary) hypertension: Secondary | ICD-10-CM

## 2019-08-07 MED ORDER — AMLODIPINE BESYLATE 5 MG PO TABS: ORAL_TABLET | ORAL | 0 refills | Status: DC

## 2019-08-07 MED ORDER — SIMVASTATIN 40 MG PO TABS: ORAL_TABLET | ORAL | 0 refills | Status: DC

## 2019-09-02 ENCOUNTER — Ambulatory Visit (INDEPENDENT_AMBULATORY_CARE_PROVIDER_SITE_OTHER): Payer: Medicare Other | Admitting: Medical

## 2019-09-02 ENCOUNTER — Encounter: Payer: Self-pay | Admitting: Medical

## 2019-09-02 ENCOUNTER — Other Ambulatory Visit: Payer: Self-pay

## 2019-09-02 DIAGNOSIS — M545 Low back pain, unspecified: Secondary | ICD-10-CM

## 2019-09-02 DIAGNOSIS — R0781 Pleurodynia: Secondary | ICD-10-CM | POA: Diagnosis not present

## 2019-09-02 DIAGNOSIS — M546 Pain in thoracic spine: Secondary | ICD-10-CM | POA: Diagnosis not present

## 2019-09-02 NOTE — Patient Instructions (Addendum)
Recent fall with moderate pain presently.  Pain description is in  lower thoracic spine, upper lumbar spine  and right rib. The mechanism did not appear to be severe but with your advanced age I do think best to get xray to rule out fx of lumbar spine, thoracic spine and rt rib area. Can get xray on Friday before you see Dr. Etter Sjogren for prescheduled appointment or can get earlier if you want.  Recommend that you take tylenol for pain and can supplment ibuprofen 400 mg every 8 hours to help with pain.  If xray shows fx then could rx low dose norco or tramadol for pain. But would do so with caution in your age group.  Follow up Friday or as needed

## 2019-09-02 NOTE — Progress Notes (Signed)
Subjective:    Patient ID: Lindsey Patel, female    DOB: 1926/12/12, 85 y.o.   MRN: [id number]  HPI  Virtual Visit via Video Note  I connected with Lindsey Patel on 09/02/19 at  1:40 PM EST by a video enabled telemedicine application and verified that I am speaking with the correct person using two identifiers.  Location: Patient: home Provider: office  No vitals taken   I discussed the limitations of evaluation and management by telemedicine and the availability of in person appointments. The patient expressed understanding and agreed to proceed.  History of Present Illness: Pt states she was answering the door. Son thinks she landed on carpet. She landed on wall first and slid down.   No loc. No head trauma .   Day of visit ems bp was 140/90. EMS evaluation was negative. No xray done. Pt points to lower rib area.   No bruising to skin. No breakdown. Daughter evaluated the area yesterday.   Pain level 6/10. Pain worse when she gets up. Very minimal pain when she lies flat.   Pt is alternating tylenol and ibuprofen 200 mg for pain.    Observations/Objective: General-no acute distress, pleasant, oriented. Lungs- on inspection lungs appear unlabored. Neck- no tracheal deviation or jvd on inspection. Neuro- gross motor function appears intact. Back- pt point to thoracic and lumbar junction as area of pain. Ribs- points to lower ribs as another area of pain.  Assessment and Plan: Recent fall with moderate pain presently.  Pain description is in  lower thoracic spine, upper lumbar spine  and right rib. The mechanism did not appear to be severe but with your advanced age I do think best to get xray to rule out fx of lumbar spine, thoracic spine and rt rib area. Can get xray on Friday before you see Dr. Etter Sjogren for prescheduled appointment or can get earlier if you want.  Recommend that you take tylenol for pain and can supplment ibuprofen 400 mg every 8 hours to help with  pain.  If xray shows fx then could rx low dose norco or tramadol for pain. But would do so with caution in your age group.  Follow up Friday or as needed  Follow Up Instructions:    I discussed the assessment and treatment plan with the patient. The patient was provided an opportunity to ask questions and all were answered. The patient agreed with the plan and demonstrated an understanding of the instructions.   The patient was advised to call back or seek an in-person evaluation if the symptoms worsen or if the condition fails to improve as anticipated.  I provided 25  minutes of non-face-to-face time during this encounter.   Mackie Pai, PA-C    Review of Systems  Constitutional: Negative for chills, fatigue and fever.  Respiratory: Negative for cough, chest tightness and shortness of breath.   Cardiovascular: Negative for chest pain and palpitations.  Gastrointestinal: Negative for abdominal pain.  Genitourinary: Negative for difficulty urinating, dysuria, frequency, pelvic pain and urgency.  Musculoskeletal: Positive for back pain. Negative for arthralgias, myalgias, neck pain and neck stiffness.       Rib pain.  Skin: Negative for rash.  Neurological: Negative for dizziness, weakness and headaches.  Hematological: Negative for adenopathy. Does not bruise/bleed easily.  Psychiatric/Behavioral: Negative for confusion.    Past Medical History:  Diagnosis Date  . Chronic lymphocytic leukemia 09/22/2007   Qualifier: Diagnosis of  By: Jerold Coombe    .  Fracture of elbow 04/01/2018  . Fracture of hip, closed ([place]) 04/01/2018   LEFT  . Hyperlipidemia   . Hypertension   . Leukemia ([place])   . Osteoporosis      Social History   Socioeconomic History  . Marital status: Married    Spouse name: Not on file  . Number of children: Not on file  . Years of education: Not on file  . Highest education level: Not on file  Occupational History  . Occupation: retired Statistician: RETIRED  Social Needs  . Financial resource strain: Not on file  . Food insecurity    Worry: Not on file    Inability: Not on file  . Transportation needs    Medical: Not on file    Non-medical: Not on file  Tobacco Use  . Smoking status: Former Smoker    Packs/day: 0.50    Years: 4.00    Pack years: 2.00    Types: Cigarettes    Quit date: 12/13/1963    Years since quitting: 55.7  . Smokeless tobacco: Never Used  Substance and Sexual Activity  . Alcohol use: No  . Drug use: Never  . Sexual activity: Not Currently    Partners: Male  Lifestyle  . Physical activity    Days per week: Not on file    Minutes per session: Not on file  . Stress: Not on file  Relationships  . Social Herbalist on phone: Not on file    Gets together: Not on file    Attends religious service: Not on file    Active member of club or organization: Not on file    Attends meetings of clubs or organizations: Not on file    Relationship status: Not on file  . Intimate partner violence    Fear of current or ex partner: Not on file    Emotionally abused: Not on file    Physically abused: Not on file    Forced sexual activity: Not on file  Other Topics Concern  . Not on file  Social History Narrative   Walk when weather is ok    Past Surgical History:  Procedure Laterality Date  . CATARACT EXTRACTION  6/03   left  . CATARACT EXTRACTION  4/09   right  . ORIF ELBOW FRACTURE Left 04/02/2018   Procedure: OPEN REDUCTION INTERNAL FIXATION (ORIF) ELBOW/OLECRANON FRACTURE;  Surgeon: Shona Needles, MD;  Location: [facility];  Service: Orthopedics;  Laterality: Left;  . ORIF HIP FRACTURE Left 04/02/2018   Procedure: OPEN REDUCTION INTERNAL FIXATION HIP;  Surgeon: Shona Needles, MD;  Location: [facility];  Service: Orthopedics;  Laterality: Left;  . TUBAL LIGATION      Family History  Problem Relation Age of Onset  . Stroke Mother   . Cancer Father        stomach  .  Parkinsonism Brother   . Cancer Brother        lung  . Heart disease Brother        MI  . Alzheimer's disease Sister   . Stroke Unknown   . Alzheimer's disease Unknown   . Dementia Unknown   . Parkinsonism Unknown     Allergies  Allergen Reactions  . Pneumococcal Vaccines Other (See Comments)    Had a local skin reaction    Current Outpatient Medications on File Prior to Visit  Medication Sig Dispense Refill  . amLODipine (NORVASC) 5 MG  tablet TAKE 1 TABLET(5 MG) BY MOUTH DAILY.  NEEDS OV BEFORE ANY MORE REFILLS 90 tablet 0  . aspirin EC 81 MG tablet Take 81 mg by mouth daily.    . calcium carbonate (OS-CAL) 600 MG TABS Take 600 mg by mouth 2 (two) times daily with a meal.    . donepezil (ARICEPT) 5 MG tablet TAKE 1 TABLET(5 MG) BY MOUTH AT BEDTIME.  Needs ov before any more refills 90 tablet 0  . fish oil-omega-3 fatty acids 1000 MG capsule Take 2 g by mouth 2 (two) times daily.     . Flaxseed, Linseed, (FLAXSEED OIL PO) Take 1,400 mg by mouth daily.    Marland Kitchen lisinopril (ZESTRIL) 40 MG tablet Take 1 tablet (40 mg total) by mouth daily. 90 tablet 0  . Multiple Vitamin (MULTIVITAMIN) tablet Take 1 tablet by mouth daily.    . naftifine (NAFTIN) 1 % cream Apply topically daily. 30 g 0  . nystatin (MYCOSTATIN/NYSTOP) powder Apply topically 4 (four) times daily. 15 g 0  . simvastatin (ZOCOR) 40 MG tablet TAKE 1 TABLET BY MOUTH EVERY DAY AT 6 PM. NEEDS OV BEFORE ANY MORE REFILLS 90 tablet 0   No current facility-administered medications on file prior to visit.     There were no vitals taken for this visit.      Objective:   Physical Exam        Assessment & Plan:

## 2019-09-04 ENCOUNTER — Ambulatory Visit: Payer: Medicare Other | Admitting: Family Medicine

## 2019-09-04 ENCOUNTER — Ambulatory Visit (HOSPITAL_COMMUNITY)
Admission: RE | Admit: 2019-09-04 | Discharge: 2019-09-04 | Disposition: A | Discharge status: Home / Self Care | Payer: Medicare Other | Source: Ambulatory Visit | Attending: Family Medicine | Admitting: Family Medicine

## 2019-09-04 ENCOUNTER — Other Ambulatory Visit: Payer: Self-pay

## 2019-09-04 ENCOUNTER — Other Ambulatory Visit: Payer: Self-pay | Admitting: Medical

## 2019-09-04 ENCOUNTER — Ambulatory Visit (HOSPITAL_BASED_OUTPATIENT_CLINIC_OR_DEPARTMENT_OTHER)
Admission: RE | Admit: 2019-09-04 | Discharge: 2019-09-04 | Disposition: A | Discharge status: Home / Self Care | Payer: Medicare Other | Source: Ambulatory Visit | Attending: Medical | Admitting: Medical

## 2019-09-04 ENCOUNTER — Encounter: Payer: Self-pay | Admitting: Family Medicine

## 2019-09-04 VITALS — BP 126/60 | HR 71 | Temp 97.9°F | Resp 18 | Ht 62.0 in | Wt 98.4 lb

## 2019-09-04 DIAGNOSIS — M546 Pain in thoracic spine: Secondary | ICD-10-CM

## 2019-09-04 DIAGNOSIS — R0781 Pleurodynia: Secondary | ICD-10-CM | POA: Diagnosis present

## 2019-09-04 DIAGNOSIS — R413 Other amnesia: Secondary | ICD-10-CM | POA: Diagnosis not present

## 2019-09-04 DIAGNOSIS — S22070A Wedge compression fracture of T9-T10 vertebra, initial encounter for closed fracture: Secondary | ICD-10-CM

## 2019-09-04 DIAGNOSIS — S22080A Wedge compression fracture of T11-T12 vertebra, initial encounter for closed fracture: Secondary | ICD-10-CM | POA: Diagnosis not present

## 2019-09-04 DIAGNOSIS — I10 Essential (primary) hypertension: Secondary | ICD-10-CM

## 2019-09-04 DIAGNOSIS — M545 Low back pain, unspecified: Secondary | ICD-10-CM

## 2019-09-04 HISTORY — DX: Wedge compression fracture of T9-T10 vertebra, initial encounter for closed fracture: S22.070A

## 2019-09-04 MED ORDER — KETOROLAC TROMETHAMINE 60 MG/2ML IM SOLN
60.0000 mg | Freq: Once | INTRAMUSCULAR | Status: AC
  Administered 2019-09-04: 60 mg via INTRAMUSCULAR

## 2019-09-04 MED ORDER — LISINOPRIL 40 MG PO TABS: 40.0000 mg | ORAL_TABLET | Freq: Every day | ORAL | 0 refills | Status: DC

## 2019-09-04 MED ORDER — DONEPEZIL HCL 5 MG PO TABS: ORAL_TABLET | ORAL | 0 refills | Status: DC

## 2019-09-04 MED ORDER — HYDROCODONE-ACETAMINOPHEN 5-325 MG PO TABS: 1.0000 | ORAL_TABLET | Freq: Four times a day (QID) | ORAL | 0 refills | Status: DC | PRN

## 2019-09-04 MED ORDER — AMLODIPINE BESYLATE 5 MG PO TABS: ORAL_TABLET | ORAL | 0 refills | Status: DC

## 2019-09-04 NOTE — Assessment & Plan Note (Signed)
Stat MRI Pain meds per orders If pain worsens go to ER Ortho on Monday

## 2019-09-04 NOTE — Assessment & Plan Note (Signed)
Well controlled, no changes to meds. Encouraged heart healthy diet such as the DASH diet and exercise as tolerated.  °

## 2019-09-04 NOTE — Patient Instructions (Signed)
Spinal Compression Fracture  A spinal compression fracture is a collapse of the bones that form the spine (vertebrae). With this type of fracture, the vertebrae become pushed (compressed) into a wedge shape. Most compression fractures happen in the middle or lower part of the spine. What are the causes? This condition may be caused by:  Thinning and loss of density in the bones (osteoporosis). This is the most common cause.  A fall.  A car or motorcycle accident.  Cancer.  Trauma, such as a heavy, direct hit to the head or back. What increases the risk? You are more likely to develop this condition if:  You are 62 years or older.  You have osteoporosis.  You have certain types of cancer, including: ? Multiple myeloma. ? Lymphoma. ? Prostate cancer. ? Lung cancer. ? Breast cancer. What are the signs or symptoms? Symptoms of this condition include:  Severe pain.  Pain that gets worse over time.  Pain that is worse when you stand, walk, sit, or bend.  Sudden pain that is so bad that it is hard for you to move.  Bending or humping of the spine.  Gradual loss of height.  Numbness, tingling, or weakness in the back and legs.  Trouble walking. Your symptoms will depend on the cause of the fracture and how quickly it develops. How is this diagnosed? This condition may be diagnosed based on symptoms, medical history, and a physical exam. During the physical exam, your health care provider may tap along the length of your spine to check for tenderness. Tests may be done to confirm the diagnosis. They may include:  A bone mineral density test to check for osteoporosis.  Imaging tests, such as a spine X-ray, CT scan, or MRI. How is this treated? Treatment for this condition depends on the cause and severity of the condition. Some fractures may heal on their own with supportive care. Treatment may include:  Pain medicine.  Rest.  A back brace.  Physical therapy  exercises.  Medicine to strengthen bone.  Calcium and vitamin D supplements. Fractures that cause the back to become misshapen, cause nerve pain or weakness, or do not respond to other treatment may be treated with surgery. This may include:  Vertebroplasty. Bone cement is injected into the collapsed vertebrae to stabilize them.  Balloon kyphoplasty. The collapsed vertebrae are expanded with a balloon and then bone cement is injected into them.  Spinal fusion. The collapsed vertebrae are connected (fused) to normal vertebrae. Follow these instructions at home: Medicines  Take over-the-counter and prescription medicines only as told by your health care provider.  Do not drive or operate heavy machinery while taking prescription pain medicine.  If you are taking prescription pain medicine, take actions to prevent or treat constipation. Your health care provider may recommend that you: ? Drink enough fluid to keep your urine pale yellow. ? Eat foods that are high in fiber, such as fresh fruits and vegetables, whole grains, and beans. ? Limit foods that are high in fat and processed sugars, such as fried or sweet foods. ? Take an over-the-counter or prescription medicine for constipation. If you have a brace:  Wear the brace as told by your health care provider. Remove it only as told by your health care provider.  Loosen the brace if your fingers or toes tingle, become numb, or turn cold and blue.  Keep the brace clean.  If the brace is not waterproof: ? Do not let it get wet. ?  Cover it with a watertight covering when you take a bath or a shower. Managing pain, stiffness, and swelling   If directed, apply ice to the injured area: ? If you have a removable brace, remove it as told by your health care provider. ? Put ice in a plastic bag. ? Place a towel between your skin and the bag. ? Leave the ice on for 30 minutes every two hours at first. Then apply the ice as needed.  Activity  Rest as told by your health care provider. ? Avoid sitting for a long time without moving. Get up to take short walks every 1-2 hours. This is important to improve blood flow and breathing. Ask for help if you feel weak or unsteady.  Return to your normal activities as directed by your health care provider. Ask what activities are safe for you.  Do exercises to improve motion and strength in your back (physical therapy), as recommended by your health care provider.  Exercise regularly as directed by your health care provider. General instructions   Do not drink alcohol. Alcohol can interfere with your treatment.  Do not use any products that contain nicotine or tobacco, such as cigarettes and e-cigarettes. These can delay bone healing. If you need help quitting, ask your health care provider.  Keep all follow-up visits as told by your health care provider. This is important. It can help to prevent permanent injury, disability, and long-lasting (chronic) pain. Contact a health care provider if:  You have a fever.  You develop a cough that makes your pain worse.  Your pain medicine is not helping.  Your pain does not get better over time.  You cannot return to your normal activities as planned or expected. Get help right away if:  Your pain is very bad and it suddenly gets worse.  You are unable to move any body part (paralysis) that is below the level of your injury.  You have numbness, tingling, or weakness in any body part that is below the level of your injury.  You cannot control your bladder or bowels. Summary  A spinal compression fracture is a collapse of the bones that form the spine (vertebrae).  With this type of fracture, the vertebrae become pushed (compressed) into a wedge shape.  Your symptoms and treatment will depend on the cause and severity of the fracture and how quickly it develops.  Some fractures may heal on their own with supportive care.  Fractures that cause the back to become misshapen, cause nerve pain or weakness, or do not respond to other treatment may be treated with surgery. This information is not intended to replace advice given to you by your health care provider. Make sure you discuss any questions you have with your health care provider. Document Released: 10/08/2005 Document Revised: 12/04/2018 Document Reviewed: 11/19/2017 Elsevier Patient Education  2020 [employer].

## 2019-09-04 NOTE — Progress Notes (Signed)
Patient ID: Lindsey Patel, female    DOB: [birth year]  Age: 85 y.o. MRN: [id number]    Subjective:  Subjective  HPI Lindsey Patel presents with her daughter --- she had a fall and c/o severe mid back pain.  The uc told her it was muscular.   She saw my PA yesterday and xrays were done this am.  + compression fracture T9 and T12  Review of Systems  Constitutional: Negative for appetite change, diaphoresis, fatigue and unexpected weight change.  Eyes: Negative for pain, redness and visual disturbance.  Respiratory: Negative for cough, chest tightness, shortness of breath and wheezing.   Cardiovascular: Negative for chest pain, palpitations and leg swelling.  Endocrine: Negative for cold intolerance, heat intolerance, polydipsia, polyphagia and polyuria.  Genitourinary: Negative for difficulty urinating, dysuria and frequency.  Musculoskeletal: Positive for back pain and gait problem.  Neurological: Negative for dizziness, light-headedness, numbness and headaches.    History Past Medical History:  Diagnosis Date  . Chronic lymphocytic leukemia 09/22/2007   Qualifier: Diagnosis of  By: Jerold Coombe    . Fracture of elbow 04/01/2018  . Fracture of hip, closed ([place]) 04/01/2018   LEFT  . Hyperlipidemia   . Hypertension   . Leukemia ([place])   . Osteoporosis     She has a past surgical history that includes Tubal ligation; Cataract extraction (6/03); Cataract extraction (4/09); ORIF elbow fracture (Left, 04/02/2018); and ORIF hip fracture (Left, 04/02/2018).   Her family history includes Alzheimer's disease in her sister and unknown relative; Cancer in her brother and father; Dementia in her unknown relative; Heart disease in her brother; Parkinsonism in her brother and unknown relative; Stroke in her mother and unknown relative.She reports that she quit smoking about 55 years ago. Her smoking use included cigarettes. She has a 2.00 pack-year smoking history. She has never used smokeless  tobacco. She reports that she does not drink alcohol or use drugs.  Current Outpatient Medications on File Prior to Visit  Medication Sig Dispense Refill  . aspirin EC 81 MG tablet Take 81 mg by mouth daily.    . calcium carbonate (OS-CAL) 600 MG TABS Take 600 mg by mouth 2 (two) times daily with a meal.    . fish oil-omega-3 fatty acids 1000 MG capsule Take 2 g by mouth 2 (two) times daily.     . Flaxseed, Linseed, (FLAXSEED OIL PO) Take 1,400 mg by mouth daily.    . Multiple Vitamin (MULTIVITAMIN) tablet Take 1 tablet by mouth daily.    . naftifine (NAFTIN) 1 % cream Apply topically daily. 30 g 0  . nystatin (MYCOSTATIN/NYSTOP) powder Apply topically 4 (four) times daily. 15 g 0  . simvastatin (ZOCOR) 40 MG tablet TAKE 1 TABLET BY MOUTH EVERY DAY AT 6 PM. NEEDS OV BEFORE ANY MORE REFILLS 90 tablet 0   No current facility-administered medications on file prior to visit.      Objective:  Objective  Physical Exam Vitals signs and nursing note reviewed.  Constitutional:      Appearance: She is well-developed.  HENT:     Head: Normocephalic and atraumatic.  Eyes:     Conjunctiva/sclera: Conjunctivae normal.  Neck:     Musculoskeletal: Normal range of motion and neck supple.     Thyroid: No thyromegaly.     Vascular: No carotid bruit or JVD.  Cardiovascular:     Rate and Rhythm: Normal rate and regular rhythm.     Heart sounds: Normal heart sounds. No  murmur.  Pulmonary:     Effort: Pulmonary effort is normal. No respiratory distress.     Breath sounds: Normal breath sounds. No wheezing or rales.  Chest:     Chest wall: No tenderness.  Musculoskeletal:        General: Tenderness present.  Neurological:     Mental Status: She is alert and oriented to person, place, and time.    BP 126/60 (BP Location: Right Arm, Patient Position: Sitting, Cuff Size: Normal)   Pulse 71   Temp 97.9 F (36.6 C) (Temporal)   Resp 18   Ht 5\' 2"  (1.575 m)   Wt 98 lb 6.4 oz (44.6 kg)   SpO2 98%    BMI 18.00 kg/m  Wt Readings from Last 3 Encounters:  09/04/19 98 lb 6.4 oz (44.6 kg)  09/16/18 117 lb (53.1 kg)  09/16/18 117 lb 6.4 oz (53.3 kg)     Lab Results  Component Value Date   WBC 14.6 (H) 07/10/2019   HGB 13.6 07/10/2019   HCT 40.7 07/10/2019   PLT 189 07/10/2019   GLUCOSE 80 07/10/2019   CHOL 160 08/14/2018   TRIG 112.0 08/14/2018   HDL 61.10 08/14/2018   LDLDIRECT 93.9 12/08/2008   LDLCALC 77 08/14/2018   ALT 12 07/10/2019   AST 19 07/10/2019   NA 139 07/10/2019   K 4.1 07/10/2019   CL 103 07/10/2019   CREATININE 0.61 07/10/2019   BUN 9 07/10/2019   CO2 28 07/10/2019   TSH 1.04 08/14/2018    Dg Ribs Unilateral Right  Result Date: 09/04/2019 CLINICAL DATA:  Rib pain status post fall. EXAM: RIGHT RIBS - 2 VIEW COMPARISON:  Chest x-ray April 01, 2018 FINDINGS: No acute fracture or dislocation is identified in the right ribs. The visualized lung fields are clear. IMPRESSION: No acute fracture or dislocation. Electronically Signed   By: Abelardo Diesel M.D.   On: 09/04/2019 10:01   Dg Thoracic Spine W/swimmers  Result Date: 09/04/2019 CLINICAL DATA:  Pt fell 4 days ago and is having med back pain, mostly on the right side EXAM: THORACIC SPINE - 3 VIEWS COMPARISON:  CT thoracic and lumbar spine 04/08/2015 FINDINGS: Generalized osteopenia. Levoscoliosis of the thoracolumbar spine. Degenerative disease with disc height loss throughout the thoracic spine. Severe T9 vertebral body compression fracture with greater than 90% height loss. Mild T12 vertebral body height loss of indeterminate age. Mild L1 vertebral body compression fracture unchanged compared with 04/08/2015. IMPRESSION: 1. Severe T9 vertebral body compression fracture with greater than 90% height loss. Mild T12 vertebral body compression fracture with less than 10% height loss. Electronically Signed   By: Kathreen Devoid   On: 09/04/2019 10:02   Dg Lumbar Spine 2-3 Views  Result Date: 09/04/2019 CLINICAL  DATA:  Status post fall with upper lumbar pain. EXAM: LUMBAR SPINE - 2-3 VIEW COMPARISON:  None. FINDINGS: There is no evidence of lumbar fracture. There is scoliosis of spine. Degenerative joint changes with narrowed joint space and osteophyte formation are identified. IMPRESSION: No acute fracture or dislocation. Degenerative joint changes of the spine. Electronically Signed   By: Abelardo Diesel M.D.   On: 09/04/2019 10:02     Assessment & Plan:  Plan  I am having Lindsey Patel start on HYDROcodone-acetaminophen. I am also having her maintain her calcium carbonate, multivitamin, fish oil-omega-3 fatty acids, (Flaxseed, Linseed, (FLAXSEED OIL PO)), aspirin EC, nystatin, naftifine, simvastatin, lisinopril, amLODipine, and donepezil. We administered ketorolac.  Meds ordered this encounter  Medications  .  lisinopril (ZESTRIL) 40 MG tablet    Sig: Take 1 tablet (40 mg total) by mouth daily.    Dispense:  90 tablet    Refill:  0  . amLODipine (NORVASC) 5 MG tablet    Sig: TAKE 1 TABLET(5 MG) BY MOUTH DAILY.  NEEDS OV BEFORE ANY MORE REFILLS    Dispense:  90 tablet    Refill:  0  . donepezil (ARICEPT) 5 MG tablet    Sig: TAKE 1 TABLET(5 MG) BY MOUTH AT BEDTIME.  Needs ov before any more refills    Dispense:  90 tablet    Refill:  0  . HYDROcodone-acetaminophen (NORCO) 5-325 MG tablet    Sig: Take 1 tablet by mouth every 6 (six) hours as needed for moderate pain.    Dispense:  30 tablet    Refill:  0  . ketorolac (TORADOL) injection 60 mg    Problem List Items Addressed This Visit      Unprioritized   Compression fracture of T9 vertebra ([place]) - Primary    Stat MRI Pain meds per orders If pain worsens go to ER Ortho on Monday      Relevant Medications   HYDROcodone-acetaminophen (NORCO) 5-325 MG tablet   Other Relevant Orders   Ambulatory referral to Orthopedic Surgery   MR Thoracic Spine Wo Contrast (Completed)   Essential hypertension    Well controlled, no changes to meds.  Encouraged heart healthy diet such as the DASH diet and exercise as tolerated.       Relevant Medications   lisinopril (ZESTRIL) 40 MG tablet   amLODipine (NORVASC) 5 MG tablet   Memory loss   Relevant Medications   donepezil (ARICEPT) 5 MG tablet      Follow-up: Return if symptoms worsen or fail to improve.  Ann Held, DO

## 2019-09-05 ENCOUNTER — Encounter: Payer: Self-pay | Admitting: Family Medicine

## 2019-09-07 ENCOUNTER — Telehealth: Payer: Self-pay | Admitting: Family Medicine

## 2019-09-07 NOTE — Telephone Encounter (Signed)
Called and spoke with Lindsey Patel at [facility] and scheduled urgent appt with Dr. Nelva Bush for 1:00pm Tuesday 11/17. I called and informed daughter.   A few moments later received call back from [facility] that Dr. Nelva Bush wants pt to see neck and back specialist Dr. Melina Schools. Appt moved to Tuesday 11/17 1:30pm. Daughter Zacarias Pontes is aware of appt.

## 2019-09-07 NOTE — Telephone Encounter (Signed)
Spoke with daughter and she stated no one has called to schedule yet.  Advised that she can take 2 of the pain pills if needed.  Advised that I will call to see if they have received referral.

## 2019-09-07 NOTE — Telephone Encounter (Signed)
Copied from [place] [id number]. Topic: General - Other >> Sep 07, 2019  8:05 AM Keene Breath wrote: Reason for CRM: Daughter called and would like the doctor or nurse to call her regarding a referral to ortho specialist and her pain medication.  She stated that the current medication is not helping very much.  Please call daughter at [phone].

## 2019-09-07 NOTE — Telephone Encounter (Signed)
Pt's daughter, called in to follow up on call back request for the message below. Informed daughter of referral details, per showing in chart, advised that someone would give her a call back to discuss further.    CB: [phone] - lvm

## 2019-09-07 NOTE — Telephone Encounter (Signed)
I thought ramos was going to see her today

## 2019-09-07 NOTE — Telephone Encounter (Signed)
She can take 2 pain pills if needed

## 2019-09-07 NOTE — Telephone Encounter (Signed)
He has a great reputation for what he does  But I'm happy to refer her somewhere else if she like

## 2019-09-08 ENCOUNTER — Telehealth: Payer: Self-pay

## 2019-09-08 NOTE — Telephone Encounter (Signed)
See phone note, patient scheduled with Dr. Rolena Infante.

## 2019-09-08 NOTE — Telephone Encounter (Signed)
Forms completed and signed. Forms mailed to provided address.

## 2019-09-09 ENCOUNTER — Telehealth: Payer: Self-pay | Admitting: Family Medicine

## 2019-09-09 NOTE — Telephone Encounter (Signed)
Pt's daughter dropped off document to be filled out by provider (1 page for clearance for possible back surgery) Pt's daughter stated wants document to be faxed to [employer] fax number is [phone].  Document put at front off tray under providers name.

## 2019-09-10 NOTE — Telephone Encounter (Signed)
Daughter will call back tomorrow to schedule.  She has to talk it over with her brother of who is going to bring her.

## 2019-09-10 NOTE — Telephone Encounter (Signed)
Form given to Dr. Etter Sjogren to review.  Not sure if we need patient to come in for surgical clearance.

## 2019-09-15 ENCOUNTER — Ambulatory Visit: Payer: Medicare Other | Admitting: Family Medicine

## 2019-09-22 ENCOUNTER — Encounter: Payer: Self-pay | Admitting: Family Medicine

## 2019-09-23 ENCOUNTER — Other Ambulatory Visit: Payer: Self-pay

## 2019-09-23 NOTE — Telephone Encounter (Signed)
We can do that

## 2019-09-24 ENCOUNTER — Ambulatory Visit: Payer: Medicare Other | Admitting: Family Medicine

## 2019-10-07 ENCOUNTER — Encounter: Payer: Self-pay | Admitting: *Deleted

## 2019-10-07 ENCOUNTER — Telehealth: Payer: Self-pay | Admitting: *Deleted

## 2019-10-07 NOTE — Telephone Encounter (Signed)
Left message on machine for daugher Zacarias Pontes to call back to let us know how patient is doing cause we was expecting to see her and they did not show up.  Advised they could send a mychart as well.

## 2019-10-28 ENCOUNTER — Other Ambulatory Visit: Payer: Self-pay

## 2019-10-29 ENCOUNTER — Other Ambulatory Visit: Payer: Self-pay

## 2019-10-29 ENCOUNTER — Ambulatory Visit (HOSPITAL_BASED_OUTPATIENT_CLINIC_OR_DEPARTMENT_OTHER)
Admission: RE | Admit: 2019-10-29 | Discharge: 2019-10-29 | Disposition: A | Discharge status: Home / Self Care | Payer: Medicare PPO | Source: Ambulatory Visit | Attending: Family Medicine | Admitting: Family Medicine

## 2019-10-29 ENCOUNTER — Ambulatory Visit: Payer: Medicare PPO | Admitting: Family Medicine

## 2019-10-29 ENCOUNTER — Encounter: Payer: Self-pay | Admitting: Family Medicine

## 2019-10-29 VITALS — BP 108/60 | HR 80 | Temp 97.6°F | Resp 18 | Ht 62.0 in | Wt 90.6 lb

## 2019-10-29 DIAGNOSIS — R140 Abdominal distension (gaseous): Secondary | ICD-10-CM | POA: Diagnosis not present

## 2019-10-29 DIAGNOSIS — R109 Unspecified abdominal pain: Secondary | ICD-10-CM | POA: Diagnosis not present

## 2019-10-29 DIAGNOSIS — R82998 Other abnormal findings in urine: Secondary | ICD-10-CM

## 2019-10-29 DIAGNOSIS — R102 Pelvic and perineal pain: Secondary | ICD-10-CM | POA: Diagnosis not present

## 2019-10-29 DIAGNOSIS — R195 Other fecal abnormalities: Secondary | ICD-10-CM

## 2019-10-29 LAB — POC URINALSYSI DIPSTICK (AUTOMATED)
Blood, UA: NEGATIVE
Glucose, UA: NEGATIVE
Nitrite, UA: NEGATIVE
Protein, UA: POSITIVE — AB
Spec Grav, UA: 1.025 (ref 1.010–1.025)
Urobilinogen, UA: 1 E.U./dL
pH, UA: 6 (ref 5.0–8.0)

## 2019-10-29 NOTE — Progress Notes (Signed)
+ Patient ID: RENLEY KOETZ, female    DOB: 1927-06-09  Age: 86 y.o. MRN: [id number]    Subjective:  Subjective  HPI ANNICE NIBBE presents for c/o loose stools in am and significant weight loss and abd bloating   No NV Her son is with her  She denies seeing any blood in her stool   Review of Systems  Constitutional: Negative for appetite change, diaphoresis, fatigue and unexpected weight change.  Eyes: Negative for pain, redness and visual disturbance.  Respiratory: Negative for cough, chest tightness, shortness of breath and wheezing.   Cardiovascular: Negative for chest pain, palpitations and leg swelling.  Gastrointestinal: Positive for abdominal distention, abdominal pain and diarrhea. Negative for anal bleeding, blood in stool, constipation, nausea, rectal pain and vomiting.  Endocrine: Negative for cold intolerance, heat intolerance, polydipsia, polyphagia and polyuria.  Genitourinary: Negative for difficulty urinating, dysuria and frequency.  Neurological: Negative for dizziness, light-headedness, numbness and headaches.    History Past Medical History:  Diagnosis Date  . Chronic lymphocytic leukemia 09/22/2007   Qualifier: Diagnosis of  By: Jerold Coombe    . Fracture of elbow 04/01/2018  . Fracture of hip, closed ([place]) 04/01/2018   LEFT  . Hyperlipidemia   . Hypertension   . Leukemia ([place])   . Osteoporosis     She has a past surgical history that includes Tubal ligation; Cataract extraction (6/03); Cataract extraction (4/09); ORIF elbow fracture (Left, 04/02/2018); and ORIF hip fracture (Left, 04/02/2018).   Her family history includes Alzheimer's disease in her sister and another family member; Cancer in her brother and father; Dementia in an other family member; Heart disease in her brother; Parkinsonism in her brother and another family member; Stroke in her mother and another family member.She reports that she quit smoking about 55 years ago. Her  smoking use included cigarettes. She has a 2.00 pack-year smoking history. She has never used smokeless tobacco. She reports that she does not drink alcohol or use drugs.  Current Outpatient Medications on File Prior to Visit  Medication Sig Dispense Refill  . amLODipine (NORVASC) 5 MG tablet TAKE 1 TABLET(5 MG) BY MOUTH DAILY.  NEEDS OV BEFORE ANY MORE REFILLS 90 tablet 0  . aspirin EC 81 MG tablet Take 81 mg by mouth daily.    . calcium carbonate (OS-CAL) 600 MG TABS Take 600 mg by mouth 2 (two) times daily with a meal.    . donepezil (ARICEPT) 5 MG tablet TAKE 1 TABLET(5 MG) BY MOUTH AT BEDTIME.  Needs ov before any more refills 90 tablet 0  . fish oil-omega-3 fatty acids 1000 MG capsule Take 2 g by mouth 2 (two) times daily.     . Flaxseed, Linseed, (FLAXSEED OIL PO) Take 1,400 mg by mouth daily.    Marland Kitchen HYDROcodone-acetaminophen (NORCO) 5-325 MG tablet Take 1 tablet by mouth every 6 (six) hours as needed for moderate pain. 30 tablet 0  . lisinopril (ZESTRIL) 40 MG tablet Take 1 tablet (40 mg total) by mouth daily. 90 tablet 0  . Multiple Vitamin (MULTIVITAMIN) tablet Take 1 tablet by mouth daily.    . naftifine (NAFTIN) 1 % cream Apply topically daily. 30 g 0  . nystatin (MYCOSTATIN/NYSTOP) powder Apply topically 4 (four) times daily. 15 g 0  . simvastatin (ZOCOR) 40 MG tablet TAKE 1 TABLET BY MOUTH EVERY DAY AT 6 PM. NEEDS OV BEFORE ANY MORE REFILLS 90 tablet 0   No current facility-administered medications on  file prior to visit.     Objective:  Objective  Physical Exam Nursing note reviewed.  Constitutional:      Appearance: She is well-developed.  HENT:     Head: Normocephalic and atraumatic.  Eyes:     Conjunctiva/sclera: Conjunctivae normal.  Neck:     Thyroid: No thyromegaly.     Vascular: No carotid bruit or JVD.  Cardiovascular:     Rate and Rhythm: Normal rate and regular rhythm.     Heart sounds: Normal heart sounds. No murmur.  Pulmonary:     Effort: Pulmonary  effort is normal. No respiratory distress.     Breath sounds: Normal breath sounds. No wheezing or rales.  Chest:     Chest wall: No tenderness.  Abdominal:     General: Abdomen is flat. There is no distension.     Palpations: Abdomen is soft.     Tenderness: There is no abdominal tenderness. There is no right CVA tenderness, left CVA tenderness, guarding or rebound.     Hernia: No hernia is present.  Genitourinary:    Rectum: Guaiac result negative. No mass, tenderness, anal fissure or external hemorrhoid. Normal anal tone.  Musculoskeletal:     Cervical back: Normal range of motion and neck supple.  Neurological:     Mental Status: She is alert and oriented to person, place, and time.    BP 108/60 (BP Location: Left Arm, Patient Position: Sitting, Cuff Size: Normal)   Pulse 80   Temp 97.6 F (36.4 C) (Temporal)   Resp 18   Ht 5\' 2"  (1.575 m)   Wt 90 lb 9.6 oz (41.1 kg)   SpO2 96%   BMI 16.57 kg/m  Wt Readings from Last 3 Encounters:  10/29/19 90 lb 9.6 oz (41.1 kg)  09/04/19 98 lb 6.4 oz (44.6 kg)  09/16/18 117 lb (53.1 kg)     Lab Results  Component Value Date   WBC 13.0 (H) 10/29/2019   HGB 13.2 10/29/2019   HCT 39.8 10/29/2019   PLT 214.0 10/29/2019   GLUCOSE 90 10/29/2019   CHOL 160 08/14/2018   TRIG 112.0 08/14/2018   HDL 61.10 08/14/2018   LDLDIRECT 93.9 12/08/2008   LDLCALC 77 08/14/2018   ALT 11 10/29/2019   AST 16 10/29/2019   NA 141 10/29/2019   K 3.5 10/29/2019   CL 103 10/29/2019   CREATININE 0.39 (L) 10/29/2019   BUN 10 10/29/2019   CO2 29 10/29/2019   TSH 1.28 10/29/2019    DG Ribs Unilateral Right  Result Date: 09/04/2019 CLINICAL DATA:  Rib pain status post fall. EXAM: RIGHT RIBS - 2 VIEW COMPARISON:  Chest x-ray April 01, 2018 FINDINGS: No acute fracture or dislocation is identified in the right ribs. The visualized lung fields are clear. IMPRESSION: No acute fracture or dislocation. Electronically Signed   By: Abelardo Diesel M.D.   On:  09/04/2019 10:01   DG Thoracic Spine W/Swimmers  Result Date: 09/04/2019 CLINICAL DATA:  Pt fell 4 days ago and is having med back pain, mostly on the right side EXAM: THORACIC SPINE - 3 VIEWS COMPARISON:  CT thoracic and lumbar spine 04/08/2015 FINDINGS: Generalized osteopenia. Levoscoliosis of the thoracolumbar spine. Degenerative disease with disc height loss throughout the thoracic spine. Severe T9 vertebral body compression fracture with greater than 90% height loss. Mild T12 vertebral body height loss of indeterminate age. Mild L1 vertebral body compression fracture unchanged compared with 04/08/2015. IMPRESSION: 1. Severe T9 vertebral body compression fracture with greater  than 90% height loss. Mild T12 vertebral body compression fracture with less than 10% height loss. Electronically Signed   By: Kathreen Devoid   On: 09/04/2019 10:02   DG Lumbar Spine 2-3 Views  Result Date: 09/04/2019 CLINICAL DATA:  Status post fall with upper lumbar pain. EXAM: LUMBAR SPINE - 2-3 VIEW COMPARISON:  None. FINDINGS: There is no evidence of lumbar fracture. There is scoliosis of spine. Degenerative joint changes with narrowed joint space and osteophyte formation are identified. IMPRESSION: No acute fracture or dislocation. Degenerative joint changes of the spine. Electronically Signed   By: Abelardo Diesel M.D.   On: 09/04/2019 10:02   MR Thoracic Spine Wo Contrast  Result Date: 09/04/2019 CLINICAL DATA:  Fall, back pain EXAM: MRI THORACIC SPINE WITHOUT CONTRAST TECHNIQUE: Multiplanar, multisequence MR imaging of the thoracic spine was performed. No intravenous contrast was administered. COMPARISON:  Thoracic spine radiographs from 09/04/2019 FINDINGS: Alignment: Mild dextroconvex thoracic scoliosis. No significant subluxation. Vertebrae: 80% compression fracture at T9 with 4 mm of posterior bony retropulsion. Associated vertebral marrow edema is present along with mild paraspinal edema. Low-level edema extends  into the T9 pedicles. Type 1 degenerative endplate findings eccentric to the right at the L1-2 level. No additional thoracic spine fractures are identified. Cord:  No significant abnormal spinal cord signal is observed. Paraspinal and other soft tissues: Mild paraspinal edema adjacent to the T9 compression fracture. There is quite likely a left renal cyst based on scout images although this is not well characterized and only partially seen. This cyst was also seen on partially on the CT lumbar spine from 04/08/2015. The top of a right adrenal mass is present, previously shown to be an adenoma on the CT from 04/08/2015. Disc levels: No significant findings above the T8-9 level. T8-9: Posterior bony retropulsion results in borderline central narrowing of the thecal sac at this level, AP diameter of the thecal sac is 1.0 cm. T9-10: Unremarkable. T10-11: Unremarkable. T11-12: Unremarkable. T12-L1: Unremarkable. This level is only included on the parasagittal images. L1-2: There is evidence of lumbar spondylosis and degenerative disc disease with questionable right foraminal impingement. This level is only included on the parasagittal images. IMPRESSION: 1. 80% compression fracture at T9 with 4 mm of posterior bony retropulsion causing borderline central narrowing of the thecal sac. 2. Mild dextroconvex thoracic scoliosis. 3. Suspected right foraminal impingement at L1-2 due to spondylosis and degenerative disc disease. 4. Right adrenal adenoma. Electronically Signed   By: Van Clines M.D.   On: 09/04/2019 20:47     Assessment & Plan:  Plan  I am having Zaya C. Totman maintain her calcium carbonate, multivitamin, fish oil-omega-3 fatty acids, (Flaxseed, Linseed, (FLAXSEED OIL PO)), aspirin EC, nystatin, naftifine, simvastatin, lisinopril, amLODipine, donepezil, and HYDROcodone-acetaminophen.  No orders of the defined types were placed in this encounter.   Problem List Items Addressed This Visit    None     Visit Diagnoses    Abdominal bloating    -  Primary   Relevant Orders   DG Abd 2 Views (Completed)   CBC with Differential (Completed)   TSH (Completed)   Comprehensive metabolic panel (Completed)   POCT Urinalysis Dipstick (Automated) (Completed)   Pelvic pain       Relevant Orders   CBC with Differential (Completed)   TSH (Completed)   Comprehensive metabolic panel (Completed)   POCT Urinalysis Dipstick (Automated) (Completed)   Loose stools       Relevant Orders   DG Abd 2 Views (  Completed)   CBC with Differential (Completed)   TSH (Completed)   Comprehensive metabolic panel (Completed)   POCT Urinalysis Dipstick (Automated) (Completed)   Stool Culture (Completed)   Clostridium difficile Toxin B, Qualitative, Real-Time PCR([facility])   Leukocytes in urine       Relevant Orders   Urine culture (Completed)    ? Constipation -- check xray Consider ct scan  Check labs and urine Drink boost/ ensure and inc water intake rto 2-3 weeks weight check   Follow-up: Return if symptoms worsen or fail to improve.  Ann Held, DO

## 2019-10-29 NOTE — Patient Instructions (Signed)
Diarrhea, Adult °Diarrhea is frequent loose and watery bowel movements. Diarrhea can make you feel weak and cause you to become dehydrated. Dehydration can make you tired and thirsty, cause you to have a dry mouth, and decrease how often you urinate. °Diarrhea typically lasts 2-3 days. However, it can last longer if it is a sign of something more serious. It is important to treat your diarrhea as told by your health care provider. °Follow these instructions at home: °Eating and drinking ° °  ° °Follow these recommendations as told by your health care provider: °· Take an oral rehydration solution (ORS). This is an over-the-counter medicine that helps return your body to its normal balance of nutrients and water. It is found at pharmacies and retail stores. °· Drink plenty of fluids, such as water, ice chips, diluted fruit juice, and low-calorie sports drinks. You can drink milk also, if desired. °· Avoid drinking fluids that contain a lot of sugar or caffeine, such as energy drinks, sports drinks, and soda. °· Eat bland, easy-to-digest foods in small amounts as you are able. These foods include bananas, applesauce, rice, lean meats, toast, and crackers. °· Avoid alcohol. °· Avoid spicy or fatty foods. ° °Medicines °· Take over-the-counter and prescription medicines only as told by your health care provider. °· If you were prescribed an antibiotic medicine, take it as told by your health care provider. Do not stop using the antibiotic even if you start to feel better. °General instructions ° °· Wash your hands often using soap and water. If soap and water are not available, use a hand sanitizer. Others in the household should wash their hands as well. Hands should be washed: °? After using the toilet or changing a diaper. °? Before preparing, cooking, or serving food. °? While caring for a sick person or while visiting someone in a hospital. °· Drink enough fluid to keep your urine pale yellow. °· Rest at home while  you recover. °· Watch your condition for any changes. °· Take a warm bath to relieve any burning or pain from frequent diarrhea episodes. °· Keep all follow-up visits as told by your health care provider. This is important. °Contact a health care provider if: °· You have a fever. °· Your diarrhea gets worse. °· You have new symptoms. °· You cannot keep fluids down. °· You feel light-headed or dizzy. °· You have a headache. °· You have muscle cramps. °Get help right away if: °· You have chest pain. °· You feel extremely weak or you faint. °· You have bloody or black stools or stools that look like tar. °· You have severe pain, cramping, or bloating in your abdomen. °· You have trouble breathing or you are breathing very quickly. °· Your heart is beating very quickly. °· Your skin feels cold and clammy. °· You feel confused. °· You have signs of dehydration, such as: °? Dark urine, very little urine, or no urine. °? Cracked lips. °? Dry mouth. °? Sunken eyes. °? Sleepiness. °? Weakness. °Summary °· Diarrhea is frequent loose and watery bowel movements. Diarrhea can make you feel weak and cause you to become dehydrated. °· Drink enough fluids to keep your urine pale yellow. °· Make sure that you wash your hands after using the toilet. If soap and water are not available, use hand sanitizer. °· Contact a health care provider if your diarrhea gets worse or you have new symptoms. °· Get help right away if you have signs of dehydration. °This   information is not intended to replace advice given to you by your health care provider. Make sure you discuss any questions you have with your health care provider. °Document Revised: 02/24/2019 Document Reviewed: 03/14/2018 °Elsevier Patient Education © 2020 [employer] ° °

## 2019-10-30 ENCOUNTER — Other Ambulatory Visit (INDEPENDENT_AMBULATORY_CARE_PROVIDER_SITE_OTHER): Payer: Medicare PPO

## 2019-10-30 ENCOUNTER — Other Ambulatory Visit: Payer: Self-pay | Admitting: Family Medicine

## 2019-10-30 ENCOUNTER — Encounter: Payer: Self-pay | Admitting: Family Medicine

## 2019-10-30 DIAGNOSIS — R195 Other fecal abnormalities: Secondary | ICD-10-CM

## 2019-10-30 LAB — CBC WITH DIFFERENTIAL/PLATELET
Basophils Absolute: 0.1 10*3/uL (ref 0.0–0.1)
Basophils Relative: 1 % (ref 0.0–3.0)
Eosinophils Absolute: 0.1 10*3/uL (ref 0.0–0.7)
Eosinophils Relative: 0.7 % (ref 0.0–5.0)
HCT: 39.8 % (ref 36.0–46.0)
Hemoglobin: 13.2 g/dL (ref 12.0–15.0)
Lymphocytes Relative: 61.5 % — ABNORMAL HIGH (ref 12.0–46.0)
Lymphs Abs: 7.7 10*3/uL — ABNORMAL HIGH (ref 0.7–4.0)
MCHC: 33.2 g/dL (ref 30.0–36.0)
MCV: 95.1 fl (ref 78.0–100.0)
Monocytes Absolute: 0.8 10*3/uL (ref 0.1–1.0)
Monocytes Relative: 6.3 % (ref 3.0–12.0)
Neutro Abs: 4.3 10*3/uL (ref 1.4–7.7)
Neutrophils Relative %: 33.1 % — ABNORMAL LOW (ref 43.0–77.0)
Platelets: 214 10*3/uL (ref 150.0–400.0)
RBC: 4.18 Mil/uL (ref 3.87–5.11)
RDW: 14.4 % (ref 11.5–15.5)
WBC: 13 10*3/uL — ABNORMAL HIGH (ref 4.0–10.5)

## 2019-10-30 LAB — COMPREHENSIVE METABOLIC PANEL
ALT: 11 U/L (ref 0–35)
AST: 16 U/L (ref 0–37)
Albumin: 3.9 g/dL (ref 3.5–5.2)
Alkaline Phosphatase: 69 U/L (ref 39–117)
BUN: 10 mg/dL (ref 6–23)
CO2: 29 mEq/L (ref 19–32)
Calcium: 9.4 mg/dL (ref 8.4–10.5)
Chloride: 103 mEq/L (ref 96–112)
Creatinine, Ser: 0.39 mg/dL — ABNORMAL LOW (ref 0.40–1.20)
GFR: 154.26 mL/min (ref >60.00)
Glucose, Bld: 90 mg/dL (ref 70–99)
Potassium: 3.5 mEq/L (ref 3.5–5.1)
Sodium: 141 mEq/L (ref 135–145)
Total Bilirubin: 0.3 mg/dL (ref 0.2–1.2)
Total Protein: 5.8 g/dL — ABNORMAL LOW (ref 6.0–8.3)

## 2019-10-30 LAB — URINE CULTURE
MICRO NUMBER:: 10018000
SPECIMEN QUALITY:: ADEQUATE

## 2019-10-30 LAB — TSH: TSH: 1.28 u[IU]/mL (ref 0.35–4.50)

## 2019-10-30 MED ORDER — CIPROFLOXACIN HCL 500 MG PO TABS: 500.0000 mg | ORAL_TABLET | Freq: Two times a day (BID) | ORAL | 0 refills | Status: DC

## 2019-10-30 NOTE — Telephone Encounter (Signed)
For now dont worry about it --- if she is no better next week we will consider it

## 2019-10-30 NOTE — Progress Notes (Signed)
Pt family returned stool culture only. States she doesn't have another container at home to complete the C.Diff order. Gave another collection kit with sterile container. They will return specimen on Monday.

## 2019-10-30 NOTE — Telephone Encounter (Signed)
Dr Carollee Herter -- please see pt's mychart message re: stool collection. She still needs to return stool for C. Diff that was ordered. Family only returned the stool culture today and needed a different container to collect the C. Diff sample in.

## 2019-11-03 LAB — STOOL CULTURE
MICRO NUMBER:: 10022477
MICRO NUMBER:: 10022478
MICRO NUMBER:: 10022479
SHIGA RESULT:: NOT DETECTED
SPECIMEN QUALITY:: ADEQUATE
SPECIMEN QUALITY:: ADEQUATE
SPECIMEN QUALITY:: ADEQUATE

## 2019-11-09 ENCOUNTER — Encounter: Payer: Self-pay | Admitting: Family Medicine

## 2019-11-17 ENCOUNTER — Other Ambulatory Visit: Payer: Self-pay | Admitting: *Deleted

## 2019-11-17 ENCOUNTER — Encounter: Payer: Self-pay | Admitting: Family Medicine

## 2019-11-17 DIAGNOSIS — I10 Essential (primary) hypertension: Secondary | ICD-10-CM

## 2019-11-17 MED ORDER — SIMVASTATIN 40 MG PO TABS: ORAL_TABLET | ORAL | 0 refills | Status: DC

## 2019-11-17 MED ORDER — AMLODIPINE BESYLATE 5 MG PO TABS: ORAL_TABLET | ORAL | 0 refills | Status: DC

## 2019-11-23 DIAGNOSIS — M4854XD Collapsed vertebra, not elsewhere classified, thoracic region, subsequent encounter for fracture with routine healing: Secondary | ICD-10-CM | POA: Diagnosis not present

## 2019-11-30 ENCOUNTER — Ambulatory Visit: Payer: Medicare PPO

## 2019-12-13 ENCOUNTER — Encounter: Payer: Self-pay | Admitting: Family Medicine

## 2019-12-14 ENCOUNTER — Other Ambulatory Visit: Payer: Self-pay | Admitting: Family Medicine

## 2019-12-14 DIAGNOSIS — F0280 Dementia in other diseases classified elsewhere without behavioral disturbance: Secondary | ICD-10-CM

## 2019-12-14 MED ORDER — DONEPEZIL HCL 10 MG PO TABS: 10.0000 mg | ORAL_TABLET | Freq: Every day | ORAL | 1 refills | Status: DC

## 2019-12-14 NOTE — Telephone Encounter (Signed)
I sent 10 mg to pharmacy

## 2019-12-22 DIAGNOSIS — H26491 Other secondary cataract, right eye: Secondary | ICD-10-CM | POA: Diagnosis not present

## 2019-12-22 DIAGNOSIS — H4440 Unspecified hypotony of eye: Secondary | ICD-10-CM | POA: Diagnosis not present

## 2019-12-22 DIAGNOSIS — H35351 Cystoid macular degeneration, right eye: Secondary | ICD-10-CM | POA: Diagnosis not present

## 2019-12-22 DIAGNOSIS — H33051 Total retinal detachment, right eye: Secondary | ICD-10-CM | POA: Diagnosis not present

## 2019-12-29 ENCOUNTER — Other Ambulatory Visit: Payer: Self-pay | Admitting: Family Medicine

## 2019-12-29 ENCOUNTER — Encounter: Payer: Self-pay | Admitting: Neurology

## 2019-12-29 ENCOUNTER — Encounter: Payer: Self-pay | Admitting: Family Medicine

## 2019-12-29 DIAGNOSIS — R413 Other amnesia: Secondary | ICD-10-CM

## 2020-01-27 ENCOUNTER — Other Ambulatory Visit: Payer: Self-pay | Admitting: Family Medicine

## 2020-01-27 MED ORDER — LISINOPRIL 40 MG PO TABS: 40.0000 mg | ORAL_TABLET | Freq: Every day | ORAL | 0 refills | Status: DC

## 2020-02-02 ENCOUNTER — Other Ambulatory Visit: Payer: Self-pay

## 2020-02-02 ENCOUNTER — Encounter (INDEPENDENT_AMBULATORY_CARE_PROVIDER_SITE_OTHER): Payer: Medicare PPO | Admitting: Ophthalmology

## 2020-02-10 ENCOUNTER — Encounter (INDEPENDENT_AMBULATORY_CARE_PROVIDER_SITE_OTHER): Payer: Self-pay | Admitting: Ophthalmology

## 2020-02-10 ENCOUNTER — Other Ambulatory Visit: Payer: Self-pay

## 2020-02-10 ENCOUNTER — Ambulatory Visit (INDEPENDENT_AMBULATORY_CARE_PROVIDER_SITE_OTHER): Payer: Medicare PPO | Admitting: Ophthalmology

## 2020-02-10 DIAGNOSIS — H33051 Total retinal detachment, right eye: Secondary | ICD-10-CM | POA: Diagnosis not present

## 2020-02-10 DIAGNOSIS — H4440 Unspecified hypotony of eye: Secondary | ICD-10-CM

## 2020-02-10 DIAGNOSIS — H35351 Cystoid macular degeneration, right eye: Secondary | ICD-10-CM | POA: Diagnosis not present

## 2020-02-10 NOTE — Progress Notes (Signed)
02/10/2020     CHIEF COMPLAINT Patient presents for Retina Follow Up   HISTORY OF PRESENT ILLNESS: Lindsey Patel is a 86 y.o. female who presents to the clinic today for:   HPI    Retina Follow Up    Patient presents with  Other.  In right eye.  Severity is moderate.  Since onset it is stable.  I, the attending physician,  performed the HPI with the patient and updated documentation appropriately.          Comments    7 Week f\u OD. OCT Status post status post YAG capsulotomy OD and attempt to release tractional forces on ciliary body.  Pt states vision is stable. Denies complaints. Using gtts as directed, needs refill.       Last edited by Hurman Horn, MD on 02/10/2020 11:11 AM. (History)      Referring physician: Carollee Herter, Alferd Apa, DO [address] [place],  [place] [id number]  HISTORICAL INFORMATION:   Selected notes from the MEDICAL RECORD NUMBER       CURRENT MEDICATIONS: Current Outpatient Medications (Ophthalmic Drugs)  Medication Sig  . prednisoLONE acetate (PRED FORTE) 1 % ophthalmic suspension Place one drop in the right eye twice per day   No current facility-administered medications for this visit. (Ophthalmic Drugs)   Current Outpatient Medications (Other)  Medication Sig  . acetaZOLAMIDE (DIAMOX) 250 MG tablet acetazolamide 250 mg tablet  . amLODipine (NORVASC) 5 MG tablet TAKE 1 TABLET(5 MG) BY MOUTH DAILY.  NEEDS OV BEFORE ANY MORE REFILLS  . aspirin EC 81 MG tablet Take 81 mg by mouth daily.  . calcitonin, salmon, (MIACALCIN/FORTICAL) 200 UNIT/ACT nasal spray calcitonin (salmon) 200 unit/actuation nasal spray  USE 1 SPRAY IN NOSTRIL DAILY  . calcium carbonate (OS-CAL) 600 MG TABS Take 600 mg by mouth 2 (two) times daily with a meal.  . cetirizine (ZYRTEC) 10 MG tablet Take by mouth.  . ciprofloxacin (CIPRO) 500 MG tablet Take 1 tablet (500 mg total) by mouth 2 (two) times daily.  . diclofenac Sodium (VOLTAREN) 1 % GEL   .  donepezil (ARICEPT) 10 MG tablet Take 1 tablet (10 mg total) by mouth at bedtime.  . fish oil-omega-3 fatty acids 1000 MG capsule Take 2 g by mouth 2 (two) times daily.   . Flaxseed Oil (LINSEED OIL) OIL Take by mouth.  . Flaxseed, Linseed, (FLAXSEED OIL PO) Take 1,400 mg by mouth daily.  Marland Kitchen HYDROcodone-acetaminophen (NORCO) 5-325 MG tablet Take 1 tablet by mouth every 6 (six) hours as needed for moderate pain.  Marland Kitchen lisinopril (ZESTRIL) 40 MG tablet Take 1 tablet (40 mg total) by mouth daily.  . Multiple Vitamin (MULTIVITAMIN) tablet Take 1 tablet by mouth daily.  . naftifine (NAFTIN) 1 % cream Apply topically daily.  Marland Kitchen nystatin (MYCOSTATIN/NYSTOP) powder Apply topically 4 (four) times daily.  . simvastatin (ZOCOR) 40 MG tablet TAKE 1 TABLET BY MOUTH EVERY DAY AT 6 PM.  NEEDS OV/FOLLOW UP   No current facility-administered medications for this visit. (Other)      REVIEW OF SYSTEMS:    ALLERGIES Allergies  Allergen Reactions  . Pneumococcal Vaccines Other (See Comments)    Had a local skin reaction    PAST MEDICAL HISTORY Past Medical History:  Diagnosis Date  . Chronic lymphocytic leukemia 09/22/2007   Qualifier: Diagnosis of  By: Jerold Coombe    . Fracture of elbow 04/01/2018  . Fracture of hip, closed ([place])  04/01/2018   LEFT  . Hyperlipidemia   . Hypertension   . Leukemia ([place])   . Osteoporosis    Past Surgical History:  Procedure Laterality Date  . CATARACT EXTRACTION  6/03   left  . CATARACT EXTRACTION  4/09   right  . ORIF ELBOW FRACTURE Left 04/02/2018   Procedure: OPEN REDUCTION INTERNAL FIXATION (ORIF) ELBOW/OLECRANON FRACTURE;  Surgeon: Shona Needles, MD;  Location: [facility];  Service: Orthopedics;  Laterality: Left;  . ORIF HIP FRACTURE Left 04/02/2018   Procedure: OPEN REDUCTION INTERNAL FIXATION HIP;  Surgeon: Shona Needles, MD;  Location: [facility];  Service: Orthopedics;  Laterality: Left;  . TUBAL LIGATION      FAMILY HISTORY Family History  Problem  Relation Age of Onset  . Stroke Mother   . Cancer Father        stomach  . Parkinsonism Brother   . Cancer Brother        lung  . Heart disease Brother        MI  . Alzheimer's disease Sister   . Stroke Other   . Alzheimer's disease Other   . Dementia Other   . Parkinsonism Other     SOCIAL HISTORY Social History   Tobacco Use  . Smoking status: Former Smoker    Packs/day: 0.50    Years: 4.00    Pack years: 2.00    Types: Cigarettes    Quit date: 12/13/1963    Years since quitting: 56.2  . Smokeless tobacco: Never Used  Substance Use Topics  . Alcohol use: No  . Drug use: Never         OPHTHALMIC EXAM:  Base Eye Exam    Visual Acuity (Snellen - Linear)      Right Left   Dist [place] E Card @ 4' 20/40 +   Dist ph [place] NI 20/25       Tonometry (Tonopen, 10:39 AM)      Right Left   Pressure 5 14       Pupils      Pupils Dark Light Shape React APD   Right PERRL 5 5 Irregular Brisk None   Left PERRL 2 1.5 Round Minimal None       Visual Fields (Counting fingers)      Left Right    Full Full  unreliable       Neuro/Psych    Oriented x3: Yes   Mood/Affect: Normal       Dilation    Right eye: 1.0% Mydriacyl, 2.5% Phenylephrine @ 10:39 AM        Slit Lamp and Fundus Exam    External Exam      Right Left   External Normal Normal       Slit Lamp Exam      Right Left   Lids/Lashes Normal    Conjunctiva/Sclera White and quiet    Cornea Clear    Anterior Chamber Deep and quiet    Iris Posterior synechiae, focal at 8:00 to the capsule, no neo-    Lens Posterior chamber intraocular lens Posterior chamber intraocular lens   Anterior Vitreous Oil clear        Fundus Exam      Right Left   Posterior Vitreous Oil clear    Disc 1+ Pallor    C/D Ratio 0.1    Macula Attached,Cystoid macular edema    Vessels Normal    Periphery Attached 360 degrees.  IMAGING AND PROCEDURES  Imaging and Procedures for 02/10/20  OCT, Retina - OU - Both  Eyes       Right Eye Quality was good. Scan locations included subfoveal. Central Foveal Thickness: 496. Progression has no prior data. Findings include abnormal foveal contour, cystoid macular edema.   Left Eye Quality was good. Scan locations included subfoveal. Central Foveal Thickness: 298. Progression has no prior data. Findings include normal observations, retinal drusen .   Notes OD with no no obvious epiretinal membrane nor traction on the macula yet with multifocal diffuse cystoid macular edema.  This could be related to mild to moderate hypotony or ongoing proliferative vitreoretinopathy  Less overall normal                ASSESSMENT/PLAN:  Cystoid macular edema of right eye The nature of cystoid macular edema including causes, exacerbating factors, and treatments including steroid and nonsteroidal anti-inflammatory drops, periocular injections of steroids, and intravitreal injections of steroids were reviewed. An informational brochure was offered.  The patient's questions were answered. The potential side effects of the various treatments were reviewed including the potential for intraocular pressure rise with steroid treatments.  I often use topical NSAIDS alone or in conjunction with periocular steroids to resolve this condition.   Patient instructed not to compress or "rub" on the eye.  More rarely, surgery may be considered if the condition is not improving.  Old retinal detachment, total/subtotal, right POST OP RETINAL DETACHMENT REPAIR, USE OF OIL- The operative eye has oil in place.  Do not sleep, rest flat on back for long periods of time, preferably, no longer than 10 minutes.  Sleep or rest on either side.  Travel may include to regions of elevation including mountains, or the coase.  Airline travel is permitted.  Vitamin B complex p.o. which may slow vitamin depletion in the inner retina      ICD-10-CM   1. Old retinal detachment, total/subtotal, right   H33.051 OCT, Retina - OU - Both Eyes  2. Hypotony, right eye  H44.40 OCT, Retina - OU - Both Eyes  3. Cystoid macular edema of right eye  H35.351 OCT, Retina - OU - Both Eyes    1.  2.  3.  Ophthalmic Meds Ordered this visit:  No orders of the defined types were placed in this encounter.      No follow-ups on file.  There are no Patient Instructions on file for this visit.   Explained the diagnoses, plan, and follow up with the patient and they expressed understanding.  Patient expressed understanding of the importance of proper follow up care.   Clent Demark Lark Runk M.D. Diseases & Surgery of the Retina and Vitreous Retina & Diabetic [place] 02/10/20     Abbreviations: M myopia (nearsighted); A astigmatism; H hyperopia (farsighted); P presbyopia; Mrx spectacle prescription;  CTL contact lenses; OD right eye; OS left eye; OU both eyes  XT exotropia; ET esotropia; PEK punctate epithelial keratitis; PEE punctate epithelial erosions; DES dry eye syndrome; MGD meibomian gland dysfunction; ATs artificial tears; PFAT's preservative free artificial tears; [place] nuclear sclerotic cataract; PSC posterior subcapsular cataract; ERM epi-retinal membrane; PVD posterior vitreous detachment; RD retinal detachment; DM diabetes mellitus; DR diabetic retinopathy; NPDR non-proliferative diabetic retinopathy; PDR proliferative diabetic retinopathy; CSME clinically significant macular edema; DME diabetic macular edema; dbh dot blot hemorrhages; CWS cotton wool spot; POAG primary open angle glaucoma; C/D cup-to-disc ratio; HVF humphrey visual field; GVF goldmann visual field; OCT optical coherence tomography; IOP  intraocular pressure; BRVO Branch retinal vein occlusion; CRVO central retinal vein occlusion; CRAO central retinal artery occlusion; BRAO branch retinal artery occlusion; RT retinal tear; SB scleral buckle; PPV pars plana vitrectomy; VH Vitreous hemorrhage; PRP panretinal laser photocoagulation; IVK  intravitreal kenalog; VMT vitreomacular traction; MH Macular hole;  NVD neovascularization of the disc; NVE neovascularization elsewhere; AREDS age related eye disease study; ARMD age related macular degeneration; POAG primary open angle glaucoma; EBMD epithelial/anterior basement membrane dystrophy; ACIOL anterior chamber intraocular lens; IOL intraocular lens; PCIOL posterior chamber intraocular lens; Phaco/IOL phacoemulsification with intraocular lens placement; [place] photorefractive keratectomy; LASIK laser assisted in situ keratomileusis; HTN hypertension; DM diabetes mellitus; COPD chronic obstructive pulmonary disease

## 2020-02-10 NOTE — Assessment & Plan Note (Signed)
POST OP RETINAL DETACHMENT REPAIR, USE OF OIL- The operative eye has oil in place.  Do not sleep, rest flat on back for long periods of time, preferably, no longer than 10 minutes.  Sleep or rest on either side.  Travel may include to regions of elevation including mountains, or the coase.  Airline travel is permitted.  Vitamin B complex p.o. which may slow vitamin depletion in the inner retina

## 2020-02-10 NOTE — Assessment & Plan Note (Signed)
The nature of cystoid macular edema including causes, exacerbating factors, and treatments including steroid and nonsteroidal anti-inflammatory drops, periocular injections of steroids, and intravitreal injections of steroids were reviewed. An informational brochure was offered.  The patient's questions were answered. The potential side effects of the various treatments were reviewed including the potential for intraocular pressure rise with steroid treatments.  I often use topical NSAIDS alone or in conjunction with periocular steroids to resolve this condition.   Patient instructed not to compress or "rub" on the eye.  More rarely, surgery may be considered if the condition is not improving.

## 2020-02-17 ENCOUNTER — Other Ambulatory Visit: Payer: Self-pay | Admitting: Family Medicine

## 2020-02-17 MED ORDER — LISINOPRIL 40 MG PO TABS: 40.0000 mg | ORAL_TABLET | Freq: Every day | ORAL | 0 refills | Status: DC

## 2020-02-19 ENCOUNTER — Other Ambulatory Visit: Payer: Self-pay

## 2020-02-19 DIAGNOSIS — I10 Essential (primary) hypertension: Secondary | ICD-10-CM

## 2020-02-19 MED ORDER — AMLODIPINE BESYLATE 5 MG PO TABS: ORAL_TABLET | ORAL | 0 refills | Status: DC

## 2020-02-19 MED ORDER — SIMVASTATIN 40 MG PO TABS: ORAL_TABLET | ORAL | 0 refills | Status: DC

## 2020-03-16 ENCOUNTER — Other Ambulatory Visit: Payer: Self-pay | Admitting: Family Medicine

## 2020-03-16 DIAGNOSIS — F0280 Dementia in other diseases classified elsewhere without behavioral disturbance: Secondary | ICD-10-CM

## 2020-03-16 NOTE — Telephone Encounter (Signed)
Caller: Zacarias Pontes (daughter) Call back phone number: [phone]  Medication: donepezil (ARICEPT) 10 MG tablet lisinopril (ZESTRIL) 40 MG tablet      Has the patient contacted their pharmacy?  (If no, request that the patient contact the pharmacy for the refill.) (If yes, when and what did the pharmacy advise?)     Preferred Pharmacy (with phone number or street name): [id number] [address], [address]  [place], [place] [address]  Phone:  [phone] Fax:  [phone]     Agent: Please be advised that RX refills may take up to 3 business days. We ask that you follow-up with your pharmacy.

## 2020-03-17 MED ORDER — DONEPEZIL HCL 10 MG PO TABS: 10.0000 mg | ORAL_TABLET | Freq: Every day | ORAL | 1 refills | Status: DC

## 2020-03-17 MED ORDER — LISINOPRIL 40 MG PO TABS: 40.0000 mg | ORAL_TABLET | Freq: Every day | ORAL | 0 refills | Status: DC

## 2020-03-17 NOTE — Telephone Encounter (Signed)
Refill sent.

## 2020-04-01 ENCOUNTER — Ambulatory Visit: Payer: Medicare PPO | Admitting: Neurology

## 2020-04-01 ENCOUNTER — Other Ambulatory Visit: Payer: Self-pay

## 2020-04-01 ENCOUNTER — Encounter: Payer: Self-pay | Admitting: Neurology

## 2020-04-01 VITALS — BP 157/83 | HR 73 | Ht 62.0 in | Wt 95.0 lb

## 2020-04-01 DIAGNOSIS — F0281 Dementia in other diseases classified elsewhere with behavioral disturbance: Secondary | ICD-10-CM

## 2020-04-01 DIAGNOSIS — F0390 Unspecified dementia without behavioral disturbance: Secondary | ICD-10-CM

## 2020-04-01 NOTE — Progress Notes (Signed)
NEUROLOGY CONSULTATION NOTE  Lindsey Patel MRN: [id number] DOB: 05/28/1927  Referring provider: Dr. Lyndal Pulley Primary care provider:  Dr. Lyndal Pulley  Reason for consult:  dementia  Dear Dr Cheri Rous:  Thank you for your kind referral of Lindsey Patel for consultation of the above symptoms. Although her history is well known to you, please allow me to reiterate it for the purpose of our medical record. The patient was accompanied to the clinic by her daughter who also provides collateral information. Records and images were personally reviewed where available.   HISTORY OF PRESENT ILLNESS: This is a 86 year old right-handed woman with a history of hypertension, hyperlipidemia, CLL, presenting for evaluation of memory loss. She is accompanied by her daughter who helps supplement the history today. She states her memory is not as good as it used to be. She lives alone. Her daughter started noticing changes in the summer of 2019. She had a fall requiring surgery on her left hip and elbow. After the surgery, she stayed with her daughter for 8 weeks and family started noticing more changes. She was forgetful, repeating the same stories, forgetting conversations from a few days prior. She manages her own medications and denies missing doses but her daughter shakes her head behind her. Her daughter reports she went back home and they got her a pill caddy to track medications, but she states she has her own system. Family would check and see that she misses a day but overall is good with it. Her daughter states another sibling helps with finances because there were some concerns, but she is not privy to this. Family visit regularly and provide meals, she does not cook. She states she is still driving, her daughter corrects her that she does not have a license so she has not been driving. On further questioning, she presents today for second opinion regarding driving. She had a driving evaluation in the  Fall of 2019 and was given restrictions not to drive at night and memory issues were noted. It was recommended that she should not renew her licence when she turned 89. She states that she has forgotten turns, "a bit of roaming," but found her way back. She denies any accidents. She was evaluated by neurologist Dr. Macario Carls in early 2020, testing in 11/2018 showed MMSE 23/30, COGNISTAT showed mild decreased ATT, moderate decreased COST, normal MEM. She was advised not to drive. Memantine was added to Donepezil but she was quite upset with her visit and did not start it. She received a letter from the [facility] that she should not drive, however she would like her driver's license back. She feels she is "capable and smart enough to know when I'm not."   She denies any headaches, dizziness, diplopia, dysarthria/dysphagia, neck pain, focal numbness/tingling/weakness, bowel/bladder dysfunction, anosmia, or tremors. She has had back pain since a fall in November 2020 with compression fracture. She reports sleep is okay. Her daughter denies any hallucinations. Her sister had Alzheimer's disease. No history of significant head injuries or alcohol use.   I personally reviewed MRI brain without contrast done 08/2018 which did not show any acute changes. There was moderate diffuse atrophy, chronic microvascular disease.  Laboratory Data: Lab Results  Component Value Date   TSH 1.28 10/29/2019   Lab Results  Component Value Date   [id number] 742 08/14/2018     PAST MEDICAL HISTORY: Past Medical History:  Diagnosis Date  . Chronic lymphocytic leukemia 09/22/2007   Qualifier: Diagnosis  of  By: Jerold Coombe    . Fracture of elbow 04/01/2018  . Fracture of hip, closed ([place]) 04/01/2018   LEFT  . Hyperlipidemia   . Hypertension   . Leukemia ([place])   . Osteoporosis     PAST SURGICAL HISTORY: Past Surgical History:  Procedure Laterality Date  . CATARACT EXTRACTION  6/03   left  . CATARACT EXTRACTION  4/09    right  . ORIF ELBOW FRACTURE Left 04/02/2018   Procedure: OPEN REDUCTION INTERNAL FIXATION (ORIF) ELBOW/OLECRANON FRACTURE;  Surgeon: Shona Needles, MD;  Location: [place];  Service: Orthopedics;  Laterality: Left;  . ORIF HIP FRACTURE Left 04/02/2018   Procedure: OPEN REDUCTION INTERNAL FIXATION HIP;  Surgeon: Shona Needles, MD;  Location: [place];  Service: Orthopedics;  Laterality: Left;  . TUBAL LIGATION      MEDICATIONS: Current Outpatient Medications on File Prior to Visit  Medication Sig Dispense Refill  . amLODipine (NORVASC) 5 MG tablet TAKE 1 TABLET(5 MG) BY MOUTH DAILY.  NEEDS OV BEFORE ANY MORE REFILLS 90 tablet 0  . aspirin EC 81 MG tablet Take 81 mg by mouth daily.    . calcium carbonate (OS-CAL) 600 MG TABS Take 600 mg by mouth 2 (two) times daily with a meal.    . cetirizine (ZYRTEC) 10 MG tablet Take by mouth.    . diclofenac Sodium (VOLTAREN) 1 % GEL     . donepezil (ARICEPT) 10 MG tablet Take 1 tablet (10 mg total) by mouth at bedtime. 90 tablet 1  . fish oil-omega-3 fatty acids 1000 MG capsule Take 2 g by mouth as needed.     . Flaxseed Oil (LINSEED OIL) OIL Take by mouth.    Marland Kitchen lisinopril (ZESTRIL) 40 MG tablet Take 1 tablet (40 mg total) by mouth daily. 30 tablet 0  . Multiple Vitamin (MULTIVITAMIN) tablet Take 1 tablet by mouth daily.    . naftifine (NAFTIN) 1 % cream Apply topically daily. 30 g 0  . nystatin (MYCOSTATIN/NYSTOP) powder Apply topically 4 (four) times daily. 15 g 0  . prednisoLONE acetate (PRED FORTE) 1 % ophthalmic suspension Place one drop in the right eye twice per day    . simvastatin (ZOCOR) 40 MG tablet TAKE 1 TABLET BY MOUTH EVERY DAY AT 6 PM.  NEEDS OV/FOLLOW UP 90 tablet 0   No current facility-administered medications on file prior to visit.    ALLERGIES: Allergies  Allergen Reactions  . Pneumococcal Vaccines Other (See Comments)    Had a local skin reaction    FAMILY HISTORY: Family History  Problem Relation Age of Onset  .  Stroke Mother   . Cancer Father        stomach  . Parkinsonism Brother   . Cancer Brother        lung  . Heart disease Brother        MI  . Alzheimer's disease Sister   . Stroke Other   . Alzheimer's disease Other   . Dementia Other   . Parkinsonism Other     SOCIAL HISTORY: Social History   Socioeconomic History  . Marital status: Married    Spouse name: Not on file  . Number of children: Not on file  . Years of education: Not on file  . Highest education level: Not on file  Occupational History  . Occupation: retired Patent attorney: RETIRED  Tobacco Use  . Smoking status: Former Smoker    Packs/day: 0.50  Years: 4.00    Pack years: 2.00    Types: Cigarettes    Quit date: 12/13/1963    Years since quitting: 56.3  . Smokeless tobacco: Never Used  Vaping Use  . Vaping Use: Never used  Substance and Sexual Activity  . Alcohol use: No  . Drug use: Never  . Sexual activity: Not Currently    Partners: Male  Other Topics Concern  . Not on file  Social History Narrative   Walk when weather is ok   Right handed    Lives alone    Social Determinants of Health   Financial Resource Strain:   . Difficulty of Paying Living Expenses:   Food Insecurity:   . Worried About Charity fundraiser in the Last Year:   . Arboriculturist in the Last Year:   Transportation Needs:   . Film/video editor (Medical):   Marland Kitchen Lack of Transportation (Non-Medical):   Physical Activity:   . Days of Exercise per Week:   . Minutes of Exercise per Session:   Stress:   . Feeling of Stress :   Social Connections:   . Frequency of Communication with Friends and Family:   . Frequency of Social Gatherings with Friends and Family:   . Attends Religious Services:   . Active Member of Clubs or Organizations:   . Attends Archivist Meetings:   Marland Kitchen Marital Status:   Intimate Partner Violence:   . Fear of Current or Ex-Partner:   . Emotionally Abused:   Marland Kitchen Physically  Abused:   . Sexually Abused:     REVIEW OF SYSTEMS: Constitutional: No fevers, chills, or sweats, no generalized fatigue, change in appetite Eyes: No visual changes, double vision, eye pain Ear, nose and throat: No hearing loss, ear pain, nasal congestion, sore throat Cardiovascular: No chest pain, palpitations Respiratory:  No shortness of breath at rest or with exertion, wheezes GastrointestinaI: No nausea, vomiting, diarrhea, abdominal pain, fecal incontinence Genitourinary:  No dysuria, urinary retention or frequency Musculoskeletal:  No neck pain, +back pain Integumentary: No rash, pruritus, skin lesions Neurological: as above Psychiatric: No depression, insomnia, anxiety Endocrine: No palpitations, fatigue, diaphoresis, mood swings, change in appetite, change in weight, increased thirst Hematologic/Lymphatic:  No anemia, purpura, petechiae. Allergic/Immunologic: no itchy/runny eyes, nasal congestion, recent allergic reactions, rashes  PHYSICAL EXAM: Vitals:   04/01/20 1019  BP: (!) 157/83  Pulse: 73  SpO2: 95%   General: No acute distress Head:  Normocephalic/atraumatic Skin/Extremities: No rash, no edema Neurological Exam: Mental status: alert and oriented to person, place, season. States it is March 2020. No dysarthria or aphasia, Fund of knowledge is appropriate.  Recent and remote memory impaired.  Attention and concentration are normal.   Able to name and repeat. MMSE 22/30. MMSE - Mini Mental State Exam 04/01/2020 09/16/2018 09/10/2017  Orientation to time 1 5 5   Orientation to Place 4 5 5   Registration 3 3 3   Attention/ Calculation 5 4 5   Recall 0 0 1  Language- name 2 objects 2 2 2   Language- repeat 1 1 1   Language- follow 3 step command 3 3 3   Language- read & follow direction 1 1 1   Write a sentence 1 1 1   Copy design 1 1 1   Total score 22 26 28    Cranial nerves: CN I: not tested CN II: pupils equal, round and reactive to light, visual fields intact CN  III, IV, VI:  full range of motion, no nystagmus,  no ptosis CN V: facial sensation intact CN VII: upper and lower face symmetric CN VIII: hearing intact to conversation Bulk & Tone: normal, no fasciculations. Motor: 5/5 throughout with no pronator drift. Sensation: intact to light touch, cold, pin, vibration and joint position sense.  No extinction to double simultaneous stimulation.  Romberg test negative Deep Tendon Reflexes: +1 throughout, no ankle clonus Plantar responses: downgoing bilaterally Cerebellar: no incoordination on finger to nose testing Gait: slow and cautious, no ataxia Tremor: none  IMPRESSION: This is a 86 year old right-handed woman with a history of hypertension, hyperlipidemia, CLL, presenting for evaluation of memory loss. Her neurological exam is non-focal, MMSE 22/30. MRI brain showed moderate diffuse atrophy and chronic microvascular disease. I discussed the diagnosis of dementia, likely due to Alzheimer's disease. I discussed that I cannot reverse driving restrictions and how the [facility] Advisory Board functions. She would like to appeal this, however I discussed with her that with her diagnosis, reversal is unlikely. She expressed frustration and became upset at the end of the visit. She was advised to continue Donepezil 10mg  daily and close family supervision. She may follow-up in 6 months, if she wishes and knows to call the office for an appointment.  Thank you for allowing me to participate in the care of this patient. Please do not hesitate to call for any questions or concerns.   Lindsey Patel, M.D.  CC: Dr. Cheri Rous

## 2020-04-01 NOTE — Patient Instructions (Signed)
Good to meet you. Continue Aricept 10mg  daily. Continue close supervision at home. If you would like to continue follow-up, I usually see patients every 6 months, call our office for follow-up.    FALL PRECAUTIONS: Be cautious when walking. Scan the area for obstacles that may increase the risk of trips and falls. When getting up in the mornings, sit up at the edge of the bed for a few minutes before getting out of bed. Consider elevating the bed at the head end to avoid drop of blood pressure when getting up. Walk always in a well-lit room (use night lights in the walls). Avoid area rugs or power cords from appliances in the middle of the walkways. Use a walker or a cane if necessary and consider physical therapy for balance exercise. Get your eyesight checked regularly.  HOME SAFETY: Consider the safety of the kitchen when operating appliances like stoves, microwave oven, and blender. Consider having supervision and share cooking responsibilities until no longer able to participate in those. Accidents with firearms and other hazards in the house should be identified and addressed as well.  DRIVING: Regarding driving, in patients with progressive memory problems, driving will be impaired. We advise to have someone else do the driving if trouble finding directions or if minor accidents are reported. Independent driving assessment is available to determine safety of driving.  ABILITY TO BE LEFT ALONE: If patient is unable to contact 911 operator, consider using LifeLine, or when the need is there, arrange for someone to stay with patients. Smoking is a fire hazard, consider supervision or cessation. Risk of wandering should be assessed by caregiver and if detected at any point, supervision and safe proof recommendations should be instituted.  MEDICATION SUPERVISION: Inability to self-administer medication needs to be constantly addressed. Implement a mechanism to ensure safe administration of the  medications.  RECOMMENDATIONS FOR ALL PATIENTS WITH MEMORY PROBLEMS: 1. Continue to exercise (Recommend 30 minutes of walking everyday, or 3 hours every week) 2. Increase social interactions - continue going to [place] and enjoy social gatherings with friends and family 3. Eat healthy, avoid fried foods and eat more fruits and vegetables 4. Maintain adequate blood pressure, blood sugar, and blood cholesterol level. Reducing the risk of stroke and cardiovascular disease also helps promoting better memory. 5. Avoid stressful situations. Live a simple life and avoid aggravations. Organize your time and prepare for the next day in anticipation. 6. Sleep well, avoid any interruptions of sleep and avoid any distractions in the bedroom that may interfere with adequate sleep quality 7. Avoid sugar, avoid sweets as there is a strong link between excessive sugar intake, diabetes, and cognitive impairment The Mediterranean diet has been shown to help patients reduce the risk of progressive memory disorders and reduces cardiovascular risk. This includes eating fish, eat fruits and green leafy vegetables, nuts like almonds and hazelnuts, walnuts, and also use olive oil. Avoid fast foods and fried foods as much as possible. Avoid sweets and sugar as sugar use has been linked to worsening of memory function.  There is always a concern of gradual progression of memory problems. If this is the case, then we may need to adjust level of care according to patient needs. Support, both to the patient and caregiver, should then be put into place.

## 2020-05-08 ENCOUNTER — Encounter: Payer: Self-pay | Admitting: Family Medicine

## 2020-05-08 DIAGNOSIS — I10 Essential (primary) hypertension: Secondary | ICD-10-CM

## 2020-05-09 MED ORDER — AMLODIPINE BESYLATE 5 MG PO TABS: 5.0000 mg | ORAL_TABLET | Freq: Every day | ORAL | 0 refills | Status: DC

## 2020-05-09 MED ORDER — LISINOPRIL 40 MG PO TABS: 40.0000 mg | ORAL_TABLET | Freq: Every day | ORAL | 0 refills | Status: DC

## 2020-05-18 ENCOUNTER — Other Ambulatory Visit: Payer: Self-pay

## 2020-05-18 MED ORDER — SIMVASTATIN 40 MG PO TABS: ORAL_TABLET | ORAL | 0 refills | Status: DC

## 2020-05-27 ENCOUNTER — Encounter: Payer: Self-pay | Admitting: Family Medicine

## 2020-05-27 ENCOUNTER — Ambulatory Visit: Payer: Medicare PPO | Admitting: Family Medicine

## 2020-05-27 ENCOUNTER — Other Ambulatory Visit: Payer: Self-pay

## 2020-05-27 VITALS — BP 128/80 | HR 66 | Temp 97.9°F | Resp 18 | Ht 62.0 in | Wt 95.0 lb

## 2020-05-27 DIAGNOSIS — I10 Essential (primary) hypertension: Secondary | ICD-10-CM | POA: Diagnosis not present

## 2020-05-27 DIAGNOSIS — E785 Hyperlipidemia, unspecified: Secondary | ICD-10-CM

## 2020-05-27 LAB — COMPREHENSIVE METABOLIC PANEL
ALT: 16 U/L (ref 0–35)
AST: 21 U/L (ref 0–37)
Albumin: 4 g/dL (ref 3.5–5.2)
Alkaline Phosphatase: 54 U/L (ref 39–117)
BUN: 12 mg/dL (ref 6–23)
CO2: 29 mEq/L (ref 19–32)
Calcium: 9.4 mg/dL (ref 8.4–10.5)
Chloride: 102 mEq/L (ref 96–112)
Creatinine, Ser: 0.54 mg/dL (ref 0.40–1.20)
GFR: 105.82 mL/min (ref >60.00)
Glucose, Bld: 87 mg/dL (ref 70–99)
Potassium: 4.5 mEq/L (ref 3.5–5.1)
Sodium: 136 mEq/L (ref 135–145)
Total Bilirubin: 0.3 mg/dL (ref 0.2–1.2)
Total Protein: 6 g/dL (ref 6.0–8.3)

## 2020-05-27 LAB — LIPID PANEL
Cholesterol: 164 mg/dL (ref 0–200)
HDL: 58.3 mg/dL (ref >39.00)
LDL Cholesterol: 72 mg/dL (ref 0–99)
NonHDL: 105.67
Total CHOL/HDL Ratio: 3
Triglycerides: 169 mg/dL — ABNORMAL HIGH (ref 0.0–149.0)
VLDL: 33.8 mg/dL (ref 0.0–40.0)

## 2020-05-27 MED ORDER — SIMVASTATIN 40 MG PO TABS: ORAL_TABLET | ORAL | 0 refills | Status: DC

## 2020-05-27 MED ORDER — AMLODIPINE BESYLATE 5 MG PO TABS: 5.0000 mg | ORAL_TABLET | Freq: Every day | ORAL | 1 refills | Status: DC

## 2020-05-27 MED ORDER — LISINOPRIL 40 MG PO TABS: 40.0000 mg | ORAL_TABLET | Freq: Every day | ORAL | 1 refills | Status: DC

## 2020-05-27 NOTE — Patient Instructions (Signed)

## 2020-05-27 NOTE — Progress Notes (Signed)
Patient ID: Lindsey Patel, female    DOB: [birth year]  Age: 86 y.o. MRN: [id number]    Subjective:  Subjective  HPI Lindsey Patel presents for f/u bp and cholesterol    No new complaints   Review of Systems  Constitutional: Negative for appetite change, diaphoresis, fatigue and unexpected weight change.  Eyes: Negative for pain, redness and visual disturbance.  Respiratory: Negative for cough, chest tightness, shortness of breath and wheezing.   Cardiovascular: Negative for chest pain, palpitations and leg swelling.  Endocrine: Negative for cold intolerance, heat intolerance, polydipsia, polyphagia and polyuria.  Genitourinary: Negative for difficulty urinating, dysuria and frequency.  Neurological: Negative for dizziness, light-headedness, numbness and headaches.    History Past Medical History:  Diagnosis Date  . Chronic lymphocytic leukemia 09/22/2007   Qualifier: Diagnosis of  By: Jerold Coombe    . Fracture of elbow 04/01/2018  . Fracture of hip, closed ([place]) 04/01/2018   LEFT  . Hyperlipidemia   . Hypertension   . Leukemia ([place])   . Osteoporosis     She has a past surgical history that includes Tubal ligation; Cataract extraction (6/03); Cataract extraction (4/09); ORIF elbow fracture (Left, 04/02/2018); and ORIF hip fracture (Left, 04/02/2018).   Her family history includes Alzheimer's disease in her sister and another family member; Cancer in her brother and father; Dementia in an other family member; Heart disease in her brother; Parkinsonism in her brother and another family member; Stroke in her mother and another family member.She reports that she quit smoking about 56 years ago. Her smoking use included cigarettes. She has a 2.00 pack-year smoking history. She has never used smokeless tobacco. She reports that she does not drink alcohol and does not use drugs.  Current Outpatient Medications on File Prior to Visit  Medication Sig Dispense Refill  . aspirin EC 81 MG  tablet Take 81 mg by mouth daily.    . calcium carbonate (OS-CAL) 600 MG TABS Take 600 mg by mouth 2 (two) times daily with a meal.    . cetirizine (ZYRTEC) 10 MG tablet Take by mouth.    . diclofenac Sodium (VOLTAREN) 1 % GEL     . donepezil (ARICEPT) 10 MG tablet Take 1 tablet (10 mg total) by mouth at bedtime. 90 tablet 1  . fish oil-omega-3 fatty acids 1000 MG capsule Take 2 g by mouth as needed.     . Flaxseed Oil (LINSEED OIL) OIL Take by mouth.    . Multiple Vitamin (MULTIVITAMIN) tablet Take 1 tablet by mouth daily.    . naftifine (NAFTIN) 1 % cream Apply topically daily. 30 g 0  . nystatin (MYCOSTATIN/NYSTOP) powder Apply topically 4 (four) times daily. 15 g 0  . prednisoLONE acetate (PRED FORTE) 1 % ophthalmic suspension Place one drop in the right eye twice per day     No current facility-administered medications on file prior to visit.     Objective:  Objective  Physical Exam Vitals and nursing note reviewed.  Constitutional:      Appearance: She is well-developed.  HENT:     Head: Normocephalic and atraumatic.  Eyes:     Conjunctiva/sclera: Conjunctivae normal.  Neck:     Thyroid: No thyromegaly.     Vascular: No carotid bruit or JVD.  Cardiovascular:     Rate and Rhythm: Normal rate and regular rhythm.     Heart sounds: Normal heart sounds. No murmur heard.   Pulmonary:     Effort: Pulmonary effort is  normal. No respiratory distress.     Breath sounds: Normal breath sounds. No wheezing or rales.  Chest:     Chest wall: No tenderness.  Musculoskeletal:     Cervical back: Normal range of motion and neck supple.  Neurological:     Mental Status: She is alert and oriented to person, place, and time.    BP 128/80 (BP Location: Right Arm, Patient Position: Sitting, Cuff Size: Small)   Pulse 66   Temp 97.9 F (36.6 C) (Oral)   Resp 18   Ht 5\' 2"  (1.575 m)   Wt 95 lb (43.1 kg)   SpO2 99%   BMI 17.38 kg/m  Wt Readings from Last 3 Encounters:  05/27/20 95 lb  (43.1 kg)  04/01/20 95 lb (43.1 kg)  10/29/19 90 lb 9.6 oz (41.1 kg)     Lab Results  Component Value Date   WBC 13.0 (H) 10/29/2019   HGB 13.2 10/29/2019   HCT 39.8 10/29/2019   PLT 214.0 10/29/2019   GLUCOSE 87 05/27/2020   CHOL 164 05/27/2020   TRIG 169.0 (H) 05/27/2020   HDL 58.30 05/27/2020   LDLDIRECT 93.9 12/08/2008   LDLCALC 72 05/27/2020   ALT 16 05/27/2020   AST 21 05/27/2020   NA 136 05/27/2020   K 4.5 05/27/2020   CL 102 05/27/2020   CREATININE 0.54 05/27/2020   BUN 12 05/27/2020   CO2 29 05/27/2020   TSH 1.28 10/29/2019    DG Abd 2 Views  Result Date: 10/29/2019 CLINICAL DATA:  Abdominal pain and bloating. EXAM: ABDOMEN - 2 VIEW COMPARISON:  None. FINDINGS: The bowel gas pattern is normal. There is no evidence of free air. No radio-opaque calculi or other significant radiographic abnormality is seen. Numerous phleboliths are seen within the pelvis. Moderate severity multilevel degenerative changes are noted throughout the lumbar spine with mild to moderate severity levoscoliosis. Radiopaque fixation screws are seen within the proximal left femur. IMPRESSION: Negative. Electronically Signed   By: Virgina Norfolk M.D.   On: 10/29/2019 15:53     Assessment & Plan:  Plan  I have changed Lindsey Patel's simvastatin. I am also having her maintain her calcium carbonate, multivitamin, fish oil-omega-3 fatty acids, aspirin EC, nystatin, naftifine, cetirizine, diclofenac Sodium, Linseed Oil, prednisoLONE acetate, donepezil, amLODipine, and lisinopril.  Meds ordered this encounter  Medications  . amLODipine (NORVASC) 5 MG tablet    Sig: Take 1 tablet (5 mg total) by mouth daily.    Dispense:  90 tablet    Refill:  1  . lisinopril (ZESTRIL) 40 MG tablet    Sig: Take 1 tablet (40 mg total) by mouth daily.    Dispense:  90 tablet    Refill:  1  . simvastatin (ZOCOR) 40 MG tablet    Sig: TAKE 1 TABLET BY MOUTH EVERY DAY AT 6 PM.    Dispense:  90 tablet    Refill:   0    Problem List Items Addressed This Visit      Unprioritized   Essential hypertension    Well controlled, no changes to meds. Encouraged heart healthy diet such as the DASH diet and exercise as tolerated.       Relevant Medications   amLODipine (NORVASC) 5 MG tablet   lisinopril (ZESTRIL) 40 MG tablet   simvastatin (ZOCOR) 40 MG tablet   Other Relevant Orders   Lipid panel (Completed)   Comprehensive metabolic panel (Completed)   Hyperlipidemia    Encouraged heart healthy diet, increase  exercise, avoid trans fats, consider a krill oil cap daily      Relevant Medications   amLODipine (NORVASC) 5 MG tablet   lisinopril (ZESTRIL) 40 MG tablet   simvastatin (ZOCOR) 40 MG tablet    Other Visit Diagnoses    Dyslipidemia    -  Primary   Relevant Medications   simvastatin (ZOCOR) 40 MG tablet   Other Relevant Orders   Lipid panel (Completed)   Comprehensive metabolic panel (Completed)      Follow-up: Return in about 6 months (around 11/27/2020), or if symptoms worsen or fail to improve, for annual exam, fasting.  Ann Held, DO

## 2020-05-30 NOTE — Assessment & Plan Note (Signed)
Well controlled, no changes to meds. Encouraged heart healthy diet such as the DASH diet and exercise as tolerated.  °

## 2020-05-30 NOTE — Assessment & Plan Note (Signed)
Encouraged heart healthy diet, increase exercise, avoid trans fats, consider a krill oil cap daily 

## 2020-06-15 ENCOUNTER — Other Ambulatory Visit: Payer: Self-pay

## 2020-06-15 ENCOUNTER — Ambulatory Visit (INDEPENDENT_AMBULATORY_CARE_PROVIDER_SITE_OTHER): Payer: Medicare PPO | Admitting: Ophthalmology

## 2020-06-15 ENCOUNTER — Encounter (INDEPENDENT_AMBULATORY_CARE_PROVIDER_SITE_OTHER): Payer: Self-pay | Admitting: Ophthalmology

## 2020-06-15 ENCOUNTER — Encounter: Payer: Self-pay | Admitting: Family Medicine

## 2020-06-15 DIAGNOSIS — H35351 Cystoid macular degeneration, right eye: Secondary | ICD-10-CM

## 2020-06-15 DIAGNOSIS — G309 Alzheimer's disease, unspecified: Secondary | ICD-10-CM

## 2020-06-15 DIAGNOSIS — H33051 Total retinal detachment, right eye: Secondary | ICD-10-CM | POA: Diagnosis not present

## 2020-06-15 NOTE — Assessment & Plan Note (Signed)
Patient continues with silicone oil in the eye, yet with hypotony, best to leave the oil in place in order to maintain the eye as a "spare tire" the retina remains attached. Diffuse CME is a note but is not the limitation of vision

## 2020-06-15 NOTE — Progress Notes (Signed)
06/15/2020     CHIEF COMPLAINT Patient presents for Retina Follow Up   HISTORY OF PRESENT ILLNESS: Lindsey Patel is a 86 y.o. female who presents to the clinic today for:   HPI    Retina Follow Up    Patient presents with  Other.  In both eyes.  This started 4 months ago.  Severity is mild.  Duration of 4 months.  Since onset it is stable.          Comments    4 Month F/U OU  Pt denies noticeable changes to [place] OU since last visit. Pt denies ocular pain, flashes of light, or floaters OU.         Last edited by Rockie Neighbours, [place] on 06/15/2020 10:33 AM. (History)      Referring physician: Carollee Herter, Alferd Apa, DO [address] [place],  [place] [address]  HISTORICAL INFORMATION:   Selected notes from the MEDICAL RECORD NUMBER       CURRENT MEDICATIONS: Current Outpatient Medications (Ophthalmic Drugs)  Medication Sig  . prednisoLONE acetate (PRED FORTE) 1 % ophthalmic suspension Place one drop in the right eye twice per day   No current facility-administered medications for this visit. (Ophthalmic Drugs)   Current Outpatient Medications (Other)  Medication Sig  . amLODipine (NORVASC) 5 MG tablet Take 1 tablet (5 mg total) by mouth daily.  Marland Kitchen aspirin EC 81 MG tablet Take 81 mg by mouth daily.  . calcium carbonate (OS-CAL) 600 MG TABS Take 600 mg by mouth 2 (two) times daily with a meal.  . cetirizine (ZYRTEC) 10 MG tablet Take by mouth.  . diclofenac Sodium (VOLTAREN) 1 % GEL   . donepezil (ARICEPT) 10 MG tablet Take 1 tablet (10 mg total) by mouth at bedtime.  . fish oil-omega-3 fatty acids 1000 MG capsule Take 2 g by mouth as needed.   . Flaxseed Oil (LINSEED OIL) OIL Take by mouth.  Marland Kitchen lisinopril (ZESTRIL) 40 MG tablet Take 1 tablet (40 mg total) by mouth daily.  . Multiple Vitamin (MULTIVITAMIN) tablet Take 1 tablet by mouth daily.  . naftifine (NAFTIN) 1 % cream Apply topically daily.  Marland Kitchen nystatin (MYCOSTATIN/NYSTOP) powder Apply topically 4  (four) times daily.  . simvastatin (ZOCOR) 40 MG tablet TAKE 1 TABLET BY MOUTH EVERY DAY AT 6 PM.   No current facility-administered medications for this visit. (Other)      REVIEW OF SYSTEMS:    ALLERGIES Allergies  Allergen Reactions  . Pneumococcal Vaccines Other (See Comments)    Had a local skin reaction    PAST MEDICAL HISTORY Past Medical History:  Diagnosis Date  . Chronic lymphocytic leukemia 09/22/2007   Qualifier: Diagnosis of  By: Jerold Coombe    . Fracture of elbow 04/01/2018  . Fracture of hip, closed ([place]) 04/01/2018   LEFT  . Hyperlipidemia   . Hypertension   . Leukemia ([place])   . Osteoporosis    Past Surgical History:  Procedure Laterality Date  . CATARACT EXTRACTION  6/03   left  . CATARACT EXTRACTION  4/09   right  . ORIF ELBOW FRACTURE Left 04/02/2018   Procedure: OPEN REDUCTION INTERNAL FIXATION (ORIF) ELBOW/OLECRANON FRACTURE;  Surgeon: Shona Needles, MD;  Location: [facility];  Service: Orthopedics;  Laterality: Left;  . ORIF HIP FRACTURE Left 04/02/2018   Procedure: OPEN REDUCTION INTERNAL FIXATION HIP;  Surgeon: Shona Needles, MD;  Location: [facility];  Service: Orthopedics;  Laterality:  Left;  . TUBAL LIGATION      FAMILY HISTORY Family History  Problem Relation Age of Onset  . Stroke Mother   . Cancer Father        stomach  . Parkinsonism Brother   . Cancer Brother        lung  . Heart disease Brother        MI  . Alzheimer's disease Sister   . Stroke Other   . Alzheimer's disease Other   . Dementia Other   . Parkinsonism Other     SOCIAL HISTORY Social History   Tobacco Use  . Smoking status: Former Smoker    Packs/day: 0.50    Years: 4.00    Pack years: 2.00    Types: Cigarettes    Quit date: 12/13/1963    Years since quitting: 56.5  . Smokeless tobacco: Never Used  Vaping Use  . Vaping Use: Never used  Substance Use Topics  . Alcohol use: No  . Drug use: Never         OPHTHALMIC EXAM:  Base Eye Exam     Visual Acuity (ETDRS)      Right Left   Dist [place] E card @ 4' 20/40   Dist ph [place] NI 20/25       Tonometry (Tonopen, 10:35 AM)      Right Left   Pressure 06 15       Pupils      Dark Light Shape React APD   Right 4 3 Irregular Sluggish None   Left 2 1 Round Brisk None       Visual Fields (Counting fingers)      Left Right    Full Full       Extraocular Movement      Right Left    Full Full       Neuro/Psych    Oriented x3: Yes   Mood/Affect: Normal       Dilation    Both eyes: 1.0% Mydriacyl, 2.5% Phenylephrine @ 10:38 AM        Slit Lamp and Fundus Exam    External Exam      Right Left   External Normal Normal       Slit Lamp Exam      Right Left   Lids/Lashes Normal Normal   Conjunctiva/Sclera White and quiet White and quiet   Cornea Clear Clear   Anterior Chamber Deep and quiet Deep and quiet   [place]ris Posterior synechiae, focal at 8:00 to the capsule, no neo- Round and reactive   Lens Posterior chamber intraocular lens Posterior chamber intraocular lens   Anterior Vitreous Oil clear Normal       Fundus Exam      Right Left   Posterior Vitreous Oil clear Normal   Disc 1+ Pallor Normal   C/D Ratio 0.5 0.5   Macula Attached,Cystoid macular edema, Epiretinal membrane Normal, no macular thickening, no hemorrhage   Vessels Normal Engorged vein   Periphery Attached 360 degrees, good buckle, CR scarring, no new holes or tears Normal, no holes or tears          IMAGING AND PROCEDURES  Imaging and Procedures for 06/15/20  OCT, Retina - OU - Both Eyes       Right Eye Quality was good. Scan locations included subfoveal. Central Foveal Thickness: 497.   Left Eye Quality was good. Scan locations included subfoveal. Central Foveal Thickness: 286. Progression has been stable. Findings include normal  observations.   Notes OD media clear, diffuse intraretinal fluid CME OD  OS near normal findings                ASSESSMENT/PLAN:  Old retinal  detachment, total/subtotal, right Patient continues with silicone oil in the eye, yet with hypotony, best to leave the oil in place in order to maintain the eye as a "spare tire" the retina remains attached. Diffuse CME is a note but is not the limitation of vision      ICD-10-CM   1. Cystoid macular edema of right eye  H35.351 OCT, Retina - OU - Both Eyes  2. Old retinal detachment, total/subtotal, right  H33.051     1.  2.  3.  Ophthalmic Meds Ordered this visit:  No orders of the defined types were placed in this encounter.      Return in about 6 months (around 12/16/2020) for DILATE OU, OCT.  There are no Patient Instructions on file for this visit.   Explained the diagnoses, plan, and follow up with the patient and they expressed understanding.  Patient expressed understanding of the importance of proper follow up care.   Clent Demark Nahal Wanless M.D. Diseases & Surgery of the Retina and Vitreous [facility] [place] 06/15/20     Abbreviations: M myopia (nearsighted); A astigmatism; H hyperopia (farsighted); P presbyopia; Mrx spectacle prescription;  CTL contact lenses; OD right eye; OS left eye; OU both eyes  XT exotropia; ET esotropia; PEK punctate epithelial keratitis; PEE punctate epithelial erosions; DES dry eye syndrome; MGD meibomian gland dysfunction; ATs artificial tears; PFAT's preservative free artificial tears; [place] nuclear sclerotic cataract; PSC posterior subcapsular cataract; ERM epi-retinal membrane; PVD posterior vitreous detachment; RD retinal detachment; DM diabetes mellitus; DR diabetic retinopathy; NPDR non-proliferative diabetic retinopathy; PDR proliferative diabetic retinopathy; CSME clinically significant macular edema; DME diabetic macular edema; dbh dot blot hemorrhages; CWS cotton wool spot; POAG primary open angle glaucoma; C/D cup-to-disc ratio; HVF humphrey visual field; GVF goldmann visual field; OCT optical coherence tomography; IOP intraocular  pressure; BRVO Branch retinal vein occlusion; CRVO central retinal vein occlusion; CRAO central retinal artery occlusion; BRAO branch retinal artery occlusion; RT retinal tear; SB scleral buckle; PPV pars plana vitrectomy; VH Vitreous hemorrhage; PRP panretinal laser photocoagulation; IVK intravitreal kenalog; VMT vitreomacular traction; MH Macular hole;  NVD neovascularization of the disc; NVE neovascularization elsewhere; AREDS age related eye disease study; ARMD age related macular degeneration; POAG primary open angle glaucoma; EBMD epithelial/anterior basement membrane dystrophy; ACIOL anterior chamber intraocular lens; IOL intraocular lens; PCIOL posterior chamber intraocular lens; Phaco/IOL phacoemulsification with intraocular lens placement; [place] photorefractive keratectomy; LASIK laser assisted in situ keratomileusis; HTN hypertension; DM diabetes mellitus; COPD chronic obstructive pulmonary disease

## 2020-06-17 MED ORDER — DONEPEZIL HCL 10 MG PO TABS: 10.0000 mg | ORAL_TABLET | Freq: Every day | ORAL | 1 refills | Status: DC

## 2020-06-28 ENCOUNTER — Other Ambulatory Visit (INDEPENDENT_AMBULATORY_CARE_PROVIDER_SITE_OTHER): Payer: Self-pay | Admitting: Ophthalmology

## 2020-06-28 MED ORDER — PREDNISOLONE ACETATE 1 % OP SUSP
OPHTHALMIC | 2 refills | Status: DC
Start: 2020-06-28 — End: 2021-01-18

## 2020-07-08 ENCOUNTER — Other Ambulatory Visit: Payer: Self-pay

## 2020-07-08 ENCOUNTER — Inpatient Hospital Stay: Payer: Medicare PPO | Admitting: Hematology

## 2020-07-08 ENCOUNTER — Inpatient Hospital Stay: Payer: Medicare PPO | Attending: Hematology

## 2020-07-08 VITALS — BP 160/66 | HR 63 | Temp 98.7°F | Resp 18 | Ht 62.0 in | Wt 97.1 lb

## 2020-07-08 DIAGNOSIS — Z23 Encounter for immunization: Secondary | ICD-10-CM

## 2020-07-08 DIAGNOSIS — Z87891 Personal history of nicotine dependence: Secondary | ICD-10-CM | POA: Diagnosis not present

## 2020-07-08 DIAGNOSIS — C9110 Chronic lymphocytic leukemia of B-cell type not having achieved remission: Secondary | ICD-10-CM | POA: Diagnosis not present

## 2020-07-08 LAB — CMP (CANCER CENTER ONLY)
ALT: 13 U/L (ref 0–44)
AST: 19 U/L (ref 15–41)
Albumin: 3.7 g/dL (ref 3.5–5.0)
Alkaline Phosphatase: 65 U/L (ref 38–126)
Anion gap: 7 (ref 5–15)
BUN: 14 mg/dL (ref 8–23)
CO2: 28 mmol/L (ref 22–32)
Calcium: 9.4 mg/dL (ref 8.9–10.3)
Chloride: 103 mmol/L (ref 98–111)
Creatinine: 0.63 mg/dL (ref 0.44–1.00)
GFR, Est AFR Am: >60 mL/min (ref >60)
GFR, Estimated: >60 mL/min (ref >60)
Glucose, Bld: 87 mg/dL (ref 70–99)
Potassium: 4.7 mmol/L (ref 3.5–5.1)
Sodium: 138 mmol/L (ref 135–145)
Total Bilirubin: 0.4 mg/dL (ref 0.3–1.2)
Total Protein: 6.1 g/dL — ABNORMAL LOW (ref 6.5–8.1)

## 2020-07-08 LAB — CBC WITH DIFFERENTIAL/PLATELET
Abs Immature Granulocytes: 0.03 10*3/uL (ref 0.00–0.07)
Basophils Absolute: 0.1 10*3/uL (ref 0.0–0.1)
Basophils Relative: 1 %
Eosinophils Absolute: 0.4 10*3/uL (ref 0.0–0.5)
Eosinophils Relative: 3 %
HCT: 38.9 % (ref 36.0–46.0)
Hemoglobin: 13.1 g/dL (ref 12.0–15.0)
Immature Granulocytes: 0 %
Lymphocytes Relative: 48 %
Lymphs Abs: 5.5 10*3/uL — ABNORMAL HIGH (ref 0.7–4.0)
MCH: 31.8 pg (ref 26.0–34.0)
MCHC: 33.7 g/dL (ref 30.0–36.0)
MCV: 94.4 fL (ref 80.0–100.0)
Monocytes Absolute: 0.7 10*3/uL (ref 0.1–1.0)
Monocytes Relative: 6 %
Neutro Abs: 4.7 10*3/uL (ref 1.7–7.7)
Neutrophils Relative %: 42 %
Platelets: 235 10*3/uL (ref 150–400)
RBC: 4.12 MIL/uL (ref 3.87–5.11)
RDW: 12.5 % (ref 11.5–15.5)
WBC: 11.3 10*3/uL — ABNORMAL HIGH (ref 4.0–10.5)
nRBC: 0 % (ref 0.0–0.2)

## 2020-07-08 LAB — LACTATE DEHYDROGENASE: LDH: 163 U/L (ref 98–192)

## 2020-07-08 NOTE — Progress Notes (Signed)
[facility]  HEMATOLOGY ONCOLOGY PROGRESS NOTE  Date of service:   07/08/20    Patient Care Team: Carollee Herter, Alferd Apa, DO as PCP - General Dema Severin Roxy Cedar., MD (Obstetrics and Gynecology) Brunetta Genera, MD as Consulting Physician (Hematology) Jari Pigg, MD as Consulting Physician (Dermatology)  CC: Follow-up for CLL  Diagnosis: Rai Stage 0 CLL  Current Treatment: Monitoring/Observation.   INTERVAL HISTORY: [name] is here for management and evaluation of her CLL. We are joined today by her son. The patient's last visit with [place] was on 07/10/2019. The pt reports that she is doing well overall.  The pt reports no new concerns or symptoms. Her biggest complaint is boredom as she is unable to get out of the house much.   Lab results today (07/08/20) of CBC w/diff and CMP is as follows: all values are WNL except for WBC at 11.3K, Lymphs Abs at 5.5K, Total Protein at 6.1. 07/08/2020 LDH at 163  On review of systems, pt denies fevers, chills, night sweats and any other symptoms.    REVIEW OF SYSTEMS:   A 10+ POINT REVIEW OF SYSTEMS WAS OBTAINED including neurology, dermatology, psychiatry, cardiac, respiratory, lymph, extremities, GI, GU, Musculoskeletal, constitutional, breasts, reproductive, HEENT.  All pertinent positives are noted in the HPI.  All others are negative.  . Past Medical History:  Diagnosis Date  . Chronic lymphocytic leukemia 09/22/2007   Qualifier: Diagnosis of  By: Jerold Coombe    . Fracture of elbow 04/01/2018  . Fracture of hip, closed ([place]) 04/01/2018   LEFT  . Hyperlipidemia   . Hypertension   . Leukemia ([place])   . Osteoporosis     . Past Surgical History:  Procedure Laterality Date  . CATARACT EXTRACTION  6/03   left  . CATARACT EXTRACTION  4/09   right  . ORIF ELBOW FRACTURE Left 04/02/2018   Procedure: OPEN REDUCTION INTERNAL FIXATION (ORIF) ELBOW/OLECRANON FRACTURE;  Surgeon: Shona Needles, MD;  Location: [facility];  Service:  Orthopedics;  Laterality: Left;  . ORIF HIP FRACTURE Left 04/02/2018   Procedure: OPEN REDUCTION INTERNAL FIXATION HIP;  Surgeon: Shona Needles, MD;  Location: [facility];  Service: Orthopedics;  Laterality: Left;  . TUBAL LIGATION      . Social History   Tobacco Use  . Smoking status: Former Smoker    Packs/day: 0.50    Years: 4.00    Pack years: 2.00    Types: Cigarettes    Quit date: 12/13/1963    Years since quitting: 56.6  . Smokeless tobacco: Never Used  Vaping Use  . Vaping Use: Never used  Substance Use Topics  . Alcohol use: No  . Drug use: Never    ALLERGIES:  is allergic to pneumococcal vaccines.  MEDICATIONS:  Current Outpatient Medications  Medication Sig Dispense Refill  . calcium carbonate (OS-CAL) 600 MG TABS Take 600 mg by mouth 2 (two) times daily with a meal.    . amLODipine (NORVASC) 5 MG tablet Take 1 tablet (5 mg total) by mouth daily. 90 tablet 1  . aspirin EC 81 MG tablet Take 81 mg by mouth daily.    . cetirizine (ZYRTEC) 10 MG tablet Take by mouth.    . diclofenac Sodium (VOLTAREN) 1 % GEL     . donepezil (ARICEPT) 10 MG tablet Take 1 tablet (10 mg total) by mouth at bedtime. 90 tablet 1  . fish oil-omega-3 fatty acids 1000 MG capsule Take 2 g by mouth as  needed.     . Flaxseed Oil (LINSEED OIL) OIL Take by mouth.    Marland Kitchen lisinopril (ZESTRIL) 40 MG tablet Take 1 tablet (40 mg total) by mouth daily. 90 tablet 1  . Multiple Vitamin (MULTIVITAMIN) tablet Take 1 tablet by mouth daily.    . naftifine (NAFTIN) 1 % cream Apply topically daily. 30 g 0  . nystatin (MYCOSTATIN/NYSTOP) powder Apply topically 4 (four) times daily. 15 g 0  . prednisoLONE acetate (PRED FORTE) 1 % ophthalmic suspension Place one drop in the right eye twice per day 5 mL 2  . simvastatin (ZOCOR) 40 MG tablet TAKE 1 TABLET BY MOUTH EVERY DAY AT 6 PM. 90 tablet 0   No current facility-administered medications for this visit.    PHYSICAL EXAMINATION: ECOG FS:2 - Symptomatic, <50%  confined to bed  Vitals:   07/08/20 1226  BP: (!) 160/66  Pulse: 63  Resp: 18  Temp: 98.7 F (37.1 C)  SpO2: 99%   Wt Readings from Last 3 Encounters:  07/08/20 97 lb 1.6 oz (44 kg)  05/27/20 95 lb (43.1 kg)  04/01/20 95 lb (43.1 kg)   Body mass index is 17.76 kg/m.    Exam was given in a chair   GENERAL:alert, in no acute distress and comfortable SKIN: no acute rashes, no significant lesions EYES: conjunctiva are pink and non-injected, sclera anicteric OROPHARYNX: MMM, no exudates, no oropharyngeal erythema or ulceration NECK: supple, no JVD LYMPH:  no palpable lymphadenopathy in the cervical, axillary or inguinal regions LUNGS: clear to auscultation b/l with normal respiratory effort HEART: regular rate & rhythm ABDOMEN:  normoactive bowel sounds , non tender, not distended. No palpable hepatosplenomegaly.  Extremity: no pedal edema PSYCH: alert & oriented x 3 with fluent speech NEURO: no focal motor/sensory deficits  LABORATORY DATA:   I have reviewed the data as listed  . CBC Latest Ref Rng & Units 07/08/2020 10/29/2019 07/10/2019  WBC 4.0 - 10.5 K/uL 11.3(H) 13.0(H) 14.6(H)  Hemoglobin 12.0 - 15.0 g/dL 13.1 13.2 13.6  Hematocrit 36 - 46 % 38.9 39.8 40.7  Platelets 150 - 400 K/uL 235 214.0 189   . CBC    Component Value Date/Time   WBC 11.3 (H) 07/08/2020 1116   RBC 4.12 07/08/2020 1116   HGB 13.1 07/08/2020 1116   HGB 13.6 07/11/2018 0956   HGB 13.6 07/11/2017 1035   HCT 38.9 07/08/2020 1116   HCT 41.0 07/11/2017 1035   PLT 235 07/08/2020 1116   PLT 216 07/11/2018 0956   PLT 208 07/11/2017 1035   MCV 94.4 07/08/2020 1116   MCV 93.0 07/11/2017 1035   MCH 31.8 07/08/2020 1116   MCHC 33.7 07/08/2020 1116   RDW 12.5 07/08/2020 1116   RDW 13.1 07/11/2017 1035   LYMPHSABS 5.5 (H) 07/08/2020 1116   LYMPHSABS 10.2 (H) 07/11/2017 1035   MONOABS 0.7 07/08/2020 1116   MONOABS 0.7 07/11/2017 1035   EOSABS 0.4 07/08/2020 1116   EOSABS 0.4 07/11/2017 1035    BASOSABS 0.1 07/08/2020 1116   BASOSABS 0.1 07/11/2017 1035    . CMP Latest Ref Rng & Units 07/08/2020 05/27/2020 10/29/2019  Glucose 70 - 99 mg/dL 87 87 90  BUN 8 - 23 mg/dL 14 12 10   Creatinine 0.44 - 1.00 mg/dL 0.63 0.54 0.39(L)  Sodium 135 - 145 mmol/L 138 136 141  Potassium 3.5 - 5.1 mmol/L 4.7 4.5 3.5  Chloride 98 - 111 mmol/L 103 102 103  CO2 22 - 32 mmol/L 28 29 29  Calcium 8.9 - 10.3 mg/dL 9.4 9.4 9.4  Total Protein 6.5 - 8.1 g/dL 6.1(L) 6.0 5.8(L)  Total Bilirubin 0.3 - 1.2 mg/dL 0.4 0.3 0.3  Alkaline Phos 38 - 126 U/L 65 54 69  AST 15 - 41 U/L 19 21 16   ALT 0 - 44 U/L 13 16 11    . Lab Results  Component Value Date   LDH 163 07/08/2020     flow cytometry 07/13/2015 :       RADIOGRAPHIC STUDIES: I have personally reviewed the radiological images as listed and agreed with the findings in the report. OCT, Retina - OU - Both Eyes  Result Date: 06/15/2020 Right Eye Quality was good. Scan locations included subfoveal. Central Foveal Thickness: 497. Left Eye Quality was good. Scan locations included subfoveal. Central Foveal Thickness: 286. Progression has been stable. Findings include normal observations. Notes OD media clear, diffuse intraretinal fluid CME OD OS near normal findings   ASSESSMENT & PLAN:    86 y.o. 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 No lymphadenopathy. No hepatosplenomegaly. No cytopenias. No constitutional symptoms. -CLL prognostic fish panel sent out- showed 13q deletion which is in keeping with indolent course the patient has had an typically suggests good prognosis. Patient's CBC today shows fairly stable lymphocyte count with no significant cytopenias. LDH is within normal limits.   PLAN: -Discussed pt labwork today, 07/08/20; blood counts and chemistries are stable, LDH is WNL -No lab or clinical evidence of CLL progression at this time. Will continue watchful observation.  -Discussed CDC  guidelines regarding the [place] booster. Recommend pt receive soon.  -Recommend pt receive the annual flu vaccine. Wait two weeks between vaccinations if possible.  -Will see back in 12 months with labs    FOLLOW UP: RTC with Dr Irene Limbo with labs in 12 months    The total time spent in the appt was 20 minutes and more than 50% was on counseling and direct patient cares.  All of the patient's questions were answered with apparent satisfaction. The patient knows to call the clinic with any problems, questions or concerns.    Sullivan Lone MD [place] AAHIVMS [facility] Hematology/Oncology Physician [facility]  (Office):       [phone] (Work cell):  [phone] (Fax):           [phone]  I, Yevette Edwards, am acting as a scribe for Dr. Sullivan Lone.   .I have reviewed the above documentation for accuracy and completeness, and I agree with the above. Brunetta Genera MD

## 2020-08-26 ENCOUNTER — Other Ambulatory Visit: Payer: Self-pay | Admitting: Family Medicine

## 2020-08-26 DIAGNOSIS — E785 Hyperlipidemia, unspecified: Secondary | ICD-10-CM

## 2020-09-02 ENCOUNTER — Other Ambulatory Visit: Payer: Self-pay | Admitting: Family Medicine

## 2020-09-02 DIAGNOSIS — I10 Essential (primary) hypertension: Secondary | ICD-10-CM

## 2020-09-05 DIAGNOSIS — H02831 Dermatochalasis of right upper eyelid: Secondary | ICD-10-CM | POA: Diagnosis not present

## 2020-09-05 DIAGNOSIS — H524 Presbyopia: Secondary | ICD-10-CM | POA: Diagnosis not present

## 2020-09-05 DIAGNOSIS — H5212 Myopia, left eye: Secondary | ICD-10-CM | POA: Diagnosis not present

## 2020-09-05 DIAGNOSIS — H02834 Dermatochalasis of left upper eyelid: Secondary | ICD-10-CM | POA: Diagnosis not present

## 2020-09-05 DIAGNOSIS — H52202 Unspecified astigmatism, left eye: Secondary | ICD-10-CM | POA: Diagnosis not present

## 2020-09-05 DIAGNOSIS — H16223 Keratoconjunctivitis sicca, not specified as Sjogren's, bilateral: Secondary | ICD-10-CM | POA: Diagnosis not present

## 2020-09-05 DIAGNOSIS — Z961 Presence of intraocular lens: Secondary | ICD-10-CM | POA: Diagnosis not present

## 2020-09-05 DIAGNOSIS — H33021 Retinal detachment with multiple breaks, right eye: Secondary | ICD-10-CM | POA: Diagnosis not present

## 2020-11-01 ENCOUNTER — Encounter: Payer: Self-pay | Admitting: Family Medicine

## 2020-11-01 NOTE — Telephone Encounter (Signed)
We need the FL2

## 2020-11-09 NOTE — Telephone Encounter (Signed)
Noted. Will correct forms and fax again

## 2020-11-09 NOTE — Telephone Encounter (Signed)
Forms have been faxed 

## 2020-11-09 NOTE — Telephone Encounter (Signed)
[facility] called requesting that [facility] for was received and they are requesting that the diagnose of dementia or memory loss needs to be in the 1st or 2nd field under dx.

## 2020-11-17 ENCOUNTER — Emergency Department (HOSPITAL_COMMUNITY)
Admission: EM | Admit: 2020-11-17 | Discharge: 2020-11-17 | Disposition: A | Discharge status: Home / Self Care | Payer: Medicare PPO | Attending: Emergency Medicine | Admitting: Emergency Medicine

## 2020-11-17 DIAGNOSIS — T50901A Poisoning by unspecified drugs, medicaments and biological substances, accidental (unintentional), initial encounter: Secondary | ICD-10-CM

## 2020-11-17 DIAGNOSIS — I10 Essential (primary) hypertension: Secondary | ICD-10-CM | POA: Diagnosis not present

## 2020-11-17 DIAGNOSIS — Z79899 Other long term (current) drug therapy: Secondary | ICD-10-CM | POA: Diagnosis not present

## 2020-11-17 DIAGNOSIS — Z7982 Long term (current) use of aspirin: Secondary | ICD-10-CM | POA: Diagnosis not present

## 2020-11-17 DIAGNOSIS — T887XXA Unspecified adverse effect of drug or medicament, initial encounter: Secondary | ICD-10-CM | POA: Diagnosis not present

## 2020-11-17 DIAGNOSIS — Z87891 Personal history of nicotine dependence: Secondary | ICD-10-CM | POA: Diagnosis not present

## 2020-11-17 DIAGNOSIS — T50904A Poisoning by unspecified drugs, medicaments and biological substances, undetermined, initial encounter: Secondary | ICD-10-CM | POA: Diagnosis not present

## 2020-11-17 NOTE — ED Provider Notes (Signed)
Blood pressure (!) 140/54, pulse 93, temperature 98.6 F (37 C), temperature source Oral, resp. rate 17, SpO2 98 %.  Assuming care from Dr. Karle Starch.  In short, Lindsey Patel is a 87 y.o. female with a chief complaint of Drug Overdose .  Refer to the original H&P for additional details.  The current plan of care is to observe until 4:30 PM. Accidental OD on home medication. If vitals are stable can d/c.   04:35 PM  Patient's vital remain stable. Patient clear for d/c.    Margette Fast, MD 11/17/20 [phone]

## 2020-11-17 NOTE — ED Notes (Signed)
Pt ambulates to the bathroom with assistance.

## 2020-11-17 NOTE — ED Triage Notes (Addendum)
PT BIBA from home after accidentally taking 4 days worth of her simvastatin, lisinopril, and amlodipine at 0830 this morning. Pt has a hx of Dementia and is A&O times 2. Pt lives with son and has a home health aide for assistance. Pt denies any symptoms. EMS BP 120/82 with a HR 86.

## 2020-11-17 NOTE — ED Notes (Signed)
Pt fully dressed. Ambulated to bathroom with standby assist. Discharge instructions reviewed with patient's son. VSS at discharge. NAD. Wheeled to waiting room. Waiting for son to pull up to ED pick up.

## 2020-11-17 NOTE — ED Provider Notes (Signed)
[facility] EMERGENCY DEPARTMENT Provider Note  CSN: [id number] Arrival date & time: 11/17/20 1034    History Chief Complaint  Patient presents with  . Drug Overdose    HPI  Lindsey Patel is a 87 y.o. female with history of dementia living at home with son who reports she accidentally took 4 days worth of her medications this morning around 0830 this morning. She denies any other symptoms. Son reports he called PCP and they recommended she come to the ED for evaluation. She is scheduled to move into an ALF next week. The medications she took 4 of were simvastatin 40mg , lisinopril 40mg , amlodipine 5mg  and ASA 81mg .    Past Medical History:  Diagnosis Date  . Chronic lymphocytic leukemia 09/22/2007   Qualifier: Diagnosis of  By: Jerold Coombe    . Fracture of elbow 04/01/2018  . Fracture of hip, closed ([place]) 04/01/2018   LEFT  . Hyperlipidemia   . Hypertension   . Leukemia ([place])   . Osteoporosis     Past Surgical History:  Procedure Laterality Date  . CATARACT EXTRACTION  6/03   left  . CATARACT EXTRACTION  4/09   right  . ORIF ELBOW FRACTURE Left 04/02/2018   Procedure: OPEN REDUCTION INTERNAL FIXATION (ORIF) ELBOW/OLECRANON FRACTURE;  Surgeon: Shona Needles, MD;  Location: [facility];  Service: Orthopedics;  Laterality: Left;  . ORIF HIP FRACTURE Left 04/02/2018   Procedure: OPEN REDUCTION INTERNAL FIXATION HIP;  Surgeon: Shona Needles, MD;  Location: [facility];  Service: Orthopedics;  Laterality: Left;  . TUBAL LIGATION      Family History  Problem Relation Age of Onset  . Stroke Mother   . Cancer Father        stomach  . Parkinsonism Brother   . Cancer Brother        lung  . Heart disease Brother        MI  . Alzheimer's disease Sister   . Stroke Other   . Alzheimer's disease Other   . Dementia Other   . Parkinsonism Other     Social History   Tobacco Use  . Smoking status: Former Smoker    Packs/day: 0.50    Years: 4.00    Pack years: 2.00     Types: Cigarettes    Quit date: 12/13/1963    Years since quitting: 56.9  . Smokeless tobacco: Never Used  Vaping Use  . Vaping Use: Never used  Substance Use Topics  . Alcohol use: No  . Drug use: Never     Home Medications Prior to Admission medications   Medication Sig Start Date End Date Taking? Authorizing Provider  amLODipine (NORVASC) 5 MG tablet Take 1 tablet (5 mg total) by mouth daily. 05/27/20   Ann Held, DO  aspirin EC 81 MG tablet Take 81 mg by mouth daily.    [provider]  calcium carbonate (OS-CAL) 600 MG TABS Take 600 mg by mouth 2 (two) times daily with a meal.    [provider]  cetirizine (ZYRTEC) 10 MG tablet Take by mouth.    [provider]  diclofenac Sodium (VOLTAREN) 1 % GEL  09/02/19   [provider]  donepezil (ARICEPT) 10 MG tablet Take 1 tablet (10 mg total) by mouth at bedtime. 06/17/20   Roma Schanz R, DO  fish oil-omega-3 fatty acids 1000 MG capsule Take 2 g by mouth as needed.     [provider]  Flaxseed Oil (LINSEED OIL) OIL Take by mouth.    [provider]  lisinopril (ZESTRIL) 40 MG tablet TAKE 1 TABLET(40 MG) BY MOUTH DAILY 09/02/20   Ann Held, DO  Multiple Vitamin (MULTIVITAMIN) tablet Take 1 tablet by mouth daily.    [provider]  naftifine (NAFTIN) 1 % cream Apply topically daily. 09/16/18   Ann Held, DO  nystatin (MYCOSTATIN/NYSTOP) powder Apply topically 4 (four) times daily. 06/05/18   Ann Held, DO  prednisoLONE acetate (PRED FORTE) 1 % ophthalmic suspension Place one drop in the right eye twice per day 06/28/20   Rankin, Clent Demark, MD  simvastatin (ZOCOR) 40 MG tablet TAKE 1 TABLET BY MOUTH EVERY DAY AT 6 PM 08/29/20   Ann Held, DO     Allergies    Pneumococcal vaccines   Review of Systems   Review of Systems A comprehensive review of systems was completed and negative except as noted in HPI.     Physical Exam BP (!) 140/54 (BP Location: Right Arm)   Pulse 93   Temp 98.6 F (37 C) (Oral)   Resp 17   SpO2 98%   Physical Exam Vitals and nursing note reviewed.  Constitutional:      Appearance: Normal appearance.  HENT:     Head: Normocephalic and atraumatic.     Nose: Nose normal.     Mouth/Throat:     Mouth: Mucous membranes are moist.  Eyes:     Extraocular Movements: Extraocular movements intact.     Conjunctiva/sclera: Conjunctivae normal.  Cardiovascular:     Rate and Rhythm: Normal rate.  Pulmonary:     Effort: Pulmonary effort is normal.     Breath sounds: Normal breath sounds.  Abdominal:     General: Abdomen is flat.     Palpations: Abdomen is soft.     Tenderness: There is no abdominal tenderness.  Musculoskeletal:        General: No swelling. Normal range of motion.     Cervical back: Neck supple.  Skin:    General: Skin is warm and dry.  Neurological:     General: No focal deficit present.     Mental Status: She is alert. Mental status is at baseline.  Psychiatric:        Mood and Affect: Mood normal.      ED Results / Procedures / Treatments   Labs (all labs ordered are listed, but only abnormal results are displayed) Labs Reviewed - No data to display  EKG EKG Interpretation  Date/Time:  Thursday November 17 2020 11:40:01 EST Ventricular Rate:  89 PR Interval:  236 QRS Duration: 102 QT Interval:  352 QTC Calculation: 428 R Axis:   -75 Text Interpretation: Sinus rhythm with 1st degree A-V block with occasional Premature ventricular complexes Left axis deviation Minimal voltage criteria for LVH, may be normal variant ( Cornell product ) Anterior infarct , age undetermined Abnormal ECG No significant change since last tracing Confirmed by Calvert Cantor [phone]) on 11/17/2020 11:42:56 AM   Radiology No results found.  Procedures Procedures  Medications Ordered in the ED Medications - No data to display   MDM  Rules/Calculators/A&P MDM I spoke with poison control who recommend EKG now, 8 hours of cardiac monitoring (since ingestion). If she becomes bradycardic or hypotension, likely due to the amlodipine and they recommend calcium first, followed by levophed and then insulin drip if refractory.   ED Course  I have  reviewed the triage vital signs and the nursing notes.  Pertinent labs & imaging results that were available during my care of the patient were reviewed by me and considered in my medical decision making (see chart for details).  Clinical Course as of 11/17/20 1514  Thu Nov 17, 2020  1456 Patient continues to maintain BP and HR without any concerning drops. Son and patient updated on plan for discharge around 1630 if remains asymptomatic.  [CS]  [place] of the patient signed out to Dr. Laverta Baltimore at the change of shift.  [CS]    Clinical Course User Index [CS] Truddie Hidden, MD    Final Clinical Impression(s) / ED Diagnoses Final diagnoses:  Accidental overdose, initial encounter    Rx / DC Orders ED Discharge Orders    None       Truddie Hidden, MD 11/17/20 [phone]

## 2020-11-17 NOTE — ED Notes (Signed)
MD at bedside. 

## 2020-11-18 ENCOUNTER — Telehealth: Payer: Self-pay

## 2020-11-18 NOTE — Telephone Encounter (Signed)
Dr Etter Sjogren we received this message from the pts daughter:   Hello, It's Lindsey Patel, [name]'s daughter. Thank you for your advice this morning. My mom did go to the ED. They spent the day monitoring her and sent her home after watching her vitals for about 8 hours. There was no significant issues thankfully. Her discharge paperwork did not indicate when she should resume her meds. As a reminder, she took 4 days of her blood pressure medicine and her statin. Do you have any recommendations for when she should begin taking her medicine again? Thank you for your help. Lindsey Patel  Please advise on when she is to resume medications.

## 2020-11-18 NOTE — Telephone Encounter (Signed)
Noted  

## 2020-11-18 NOTE — Telephone Encounter (Signed)
Restart statin Monday  Are they able to check her bp at home ---- if 120/80 or higher start  bp med today or tomorrow

## 2020-11-28 DIAGNOSIS — C9111 Chronic lymphocytic leukemia of B-cell type in remission: Secondary | ICD-10-CM | POA: Diagnosis not present

## 2020-11-28 DIAGNOSIS — E782 Mixed hyperlipidemia: Secondary | ICD-10-CM | POA: Diagnosis not present

## 2020-11-28 DIAGNOSIS — I10 Essential (primary) hypertension: Secondary | ICD-10-CM | POA: Diagnosis not present

## 2020-12-05 DIAGNOSIS — R2681 Unsteadiness on feet: Secondary | ICD-10-CM | POA: Diagnosis not present

## 2020-12-05 DIAGNOSIS — M6281 Muscle weakness (generalized): Secondary | ICD-10-CM | POA: Diagnosis not present

## 2020-12-06 DIAGNOSIS — F0390 Unspecified dementia without behavioral disturbance: Secondary | ICD-10-CM | POA: Diagnosis not present

## 2020-12-07 DIAGNOSIS — R2681 Unsteadiness on feet: Secondary | ICD-10-CM | POA: Diagnosis not present

## 2020-12-07 DIAGNOSIS — M6281 Muscle weakness (generalized): Secondary | ICD-10-CM | POA: Diagnosis not present

## 2020-12-07 DIAGNOSIS — R5383 Other fatigue: Secondary | ICD-10-CM | POA: Diagnosis not present

## 2020-12-07 DIAGNOSIS — R278 Other lack of coordination: Secondary | ICD-10-CM | POA: Diagnosis not present

## 2020-12-08 DIAGNOSIS — R5383 Other fatigue: Secondary | ICD-10-CM | POA: Diagnosis not present

## 2020-12-08 DIAGNOSIS — M6281 Muscle weakness (generalized): Secondary | ICD-10-CM | POA: Diagnosis not present

## 2020-12-08 DIAGNOSIS — R278 Other lack of coordination: Secondary | ICD-10-CM | POA: Diagnosis not present

## 2020-12-08 DIAGNOSIS — R2681 Unsteadiness on feet: Secondary | ICD-10-CM | POA: Diagnosis not present

## 2020-12-12 ENCOUNTER — Encounter: Payer: Medicare PPO | Admitting: Family Medicine

## 2020-12-12 DIAGNOSIS — R2681 Unsteadiness on feet: Secondary | ICD-10-CM | POA: Diagnosis not present

## 2020-12-12 DIAGNOSIS — M6281 Muscle weakness (generalized): Secondary | ICD-10-CM | POA: Diagnosis not present

## 2020-12-14 DIAGNOSIS — R2681 Unsteadiness on feet: Secondary | ICD-10-CM | POA: Diagnosis not present

## 2020-12-14 DIAGNOSIS — M6281 Muscle weakness (generalized): Secondary | ICD-10-CM | POA: Diagnosis not present

## 2020-12-15 DIAGNOSIS — I10 Essential (primary) hypertension: Secondary | ICD-10-CM | POA: Diagnosis not present

## 2020-12-15 DIAGNOSIS — D519 Vitamin B12 deficiency anemia, unspecified: Secondary | ICD-10-CM | POA: Diagnosis not present

## 2020-12-15 DIAGNOSIS — E119 Type 2 diabetes mellitus without complications: Secondary | ICD-10-CM | POA: Diagnosis not present

## 2020-12-15 DIAGNOSIS — E559 Vitamin D deficiency, unspecified: Secondary | ICD-10-CM | POA: Diagnosis not present

## 2020-12-15 DIAGNOSIS — E039 Hypothyroidism, unspecified: Secondary | ICD-10-CM | POA: Diagnosis not present

## 2020-12-19 ENCOUNTER — Encounter (INDEPENDENT_AMBULATORY_CARE_PROVIDER_SITE_OTHER): Payer: Self-pay | Admitting: Ophthalmology

## 2020-12-19 ENCOUNTER — Other Ambulatory Visit: Payer: Self-pay

## 2020-12-19 ENCOUNTER — Ambulatory Visit (INDEPENDENT_AMBULATORY_CARE_PROVIDER_SITE_OTHER): Payer: Medicare PPO | Admitting: Ophthalmology

## 2020-12-19 DIAGNOSIS — R278 Other lack of coordination: Secondary | ICD-10-CM | POA: Diagnosis not present

## 2020-12-19 DIAGNOSIS — R2681 Unsteadiness on feet: Secondary | ICD-10-CM | POA: Diagnosis not present

## 2020-12-19 DIAGNOSIS — H35351 Cystoid macular degeneration, right eye: Secondary | ICD-10-CM

## 2020-12-19 DIAGNOSIS — M6281 Muscle weakness (generalized): Secondary | ICD-10-CM | POA: Diagnosis not present

## 2020-12-19 DIAGNOSIS — H353121 Nonexudative age-related macular degeneration, left eye, early dry stage: Secondary | ICD-10-CM

## 2020-12-19 DIAGNOSIS — H4440 Unspecified hypotony of eye: Secondary | ICD-10-CM | POA: Diagnosis not present

## 2020-12-19 DIAGNOSIS — R5383 Other fatigue: Secondary | ICD-10-CM | POA: Diagnosis not present

## 2020-12-19 NOTE — Assessment & Plan Note (Signed)
The nature of age-related macular degeneration was discussed with the patient as well as the distinction between dry and wet types. Checking an Amsler Grid daily with advice to return immediately should a distortion develop, was given to the patient. The patient 's smoking status now and in the past was determined and advice based on the AREDS study was provided regarding the consumption of antioxidant supplements. AREDS 2 vitamin formulation was recommended. Consumption of dark leafy vegetables and fresh fruits of various colors was recommended. Treatment modalities for wet macular degeneration particularly the use of intravitreal injections of anti-blood vessel growth factors was discussed with the patient. Avastin, Lucentis, and Eylea are the available options. On occasion, therapy includes the use of photodynamic therapy and thermal laser. Stressed to the patient do not rub eyes.  Patient was advised to check Amsler Grid daily and return immediately if changes are noted. Instructions on using the grid were given to the patient. All patient questions were answered.  Patient's left eye does not reach the level of intermediate ARMD thus does not qualify for the recommendation for vitamin use

## 2020-12-19 NOTE — Assessment & Plan Note (Signed)
Stable over time observe 

## 2020-12-19 NOTE — Progress Notes (Addendum)
12/19/2020     CHIEF COMPLAINT Patient presents for Retina Follow Up (5 Mo F/U OU//Pt denies noticeable changes to [place] OU since last visit. Pt denies ocular pain, flashes of light, or floaters OU. //)   HISTORY OF PRESENT ILLNESS: Lindsey Patel is a 87 y.o. female who presents to the clinic today for:   HPI    Retina Follow Up    Patient presents with  Other.  In right eye.  This started 5 months ago.  Severity is moderate.  Duration of 5 months.  Since onset it is stable. Additional comments: 5 Mo F/U OU  Pt denies noticeable changes to [place] OU since last visit. Pt denies ocular pain, flashes of light, or floaters OU.          Last edited by Rockie Neighbours, [place] on 12/19/2020 11:00 AM. (History)      Referring physician: Carollee Herter, Alferd Apa, DO [address] [place],  [place] [address]  HISTORICAL INFORMATION:   Selected notes from the MEDICAL RECORD NUMBER       CURRENT MEDICATIONS: Current Outpatient Medications (Ophthalmic Drugs)  Medication Sig  . prednisoLONE acetate (PRED FORTE) 1 % ophthalmic suspension Place one drop in the right eye twice per day   No current facility-administered medications for this visit. (Ophthalmic Drugs)   Current Outpatient Medications (Other)  Medication Sig  . amLODipine (NORVASC) 5 MG tablet Take 1 tablet (5 mg total) by mouth daily.  Marland Kitchen aspirin EC 81 MG tablet Take 81 mg by mouth daily.  . calcium carbonate (OS-CAL) 600 MG TABS Take 600 mg by mouth 2 (two) times daily with a meal.  . cetirizine (ZYRTEC) 10 MG tablet Take by mouth.  . diclofenac Sodium (VOLTAREN) 1 % GEL   . donepezil (ARICEPT) 10 MG tablet Take 1 tablet (10 mg total) by mouth at bedtime.  . fish oil-omega-3 fatty acids 1000 MG capsule Take 2 g by mouth as needed.   . Flaxseed Oil (LINSEED OIL) OIL Take by mouth.  Marland Kitchen lisinopril (ZESTRIL) 40 MG tablet TAKE 1 TABLET(40 MG) BY MOUTH DAILY  . Multiple Vitamin (MULTIVITAMIN) tablet Take 1 tablet by mouth  daily.  . naftifine (NAFTIN) 1 % cream Apply topically daily.  Marland Kitchen nystatin (MYCOSTATIN/NYSTOP) powder Apply topically 4 (four) times daily.  . simvastatin (ZOCOR) 40 MG tablet TAKE 1 TABLET BY MOUTH EVERY DAY AT 6 PM   No current facility-administered medications for this visit. (Other)      REVIEW OF SYSTEMS:    ALLERGIES Allergies  Allergen Reactions  . Pneumococcal Vaccines Other (See Comments)    Had a local skin reaction    PAST MEDICAL HISTORY Past Medical History:  Diagnosis Date  . Chronic lymphocytic leukemia 09/22/2007   Qualifier: Diagnosis of  By: Jerold Coombe    . Fracture of elbow 04/01/2018  . Fracture of hip, closed ([facility]) 04/01/2018   LEFT  . Hyperlipidemia   . Hypertension   . Leukemia ([facility])   . Osteoporosis    Past Surgical History:  Procedure Laterality Date  . CATARACT EXTRACTION  6/03   left  . CATARACT EXTRACTION  4/09   right  . ORIF ELBOW FRACTURE Left 04/02/2018   Procedure: OPEN REDUCTION INTERNAL FIXATION (ORIF) ELBOW/OLECRANON FRACTURE;  Surgeon: Shona Needles, MD;  Location: [facility];  Service: Orthopedics;  Laterality: Left;  . ORIF HIP FRACTURE Left 04/02/2018   Procedure: OPEN REDUCTION INTERNAL FIXATION HIP;  Surgeon: Haddix,  Thomasene Lot, MD;  Location: [facility];  Service: Orthopedics;  Laterality: Left;  . TUBAL LIGATION      FAMILY HISTORY Family History  Problem Relation Age of Onset  . Stroke Mother   . Cancer Father        stomach  . Parkinsonism Brother   . Cancer Brother        lung  . Heart disease Brother        MI  . Alzheimer's disease Sister   . Stroke Other   . Alzheimer's disease Other   . Dementia Other   . Parkinsonism Other     SOCIAL HISTORY Social History   Tobacco Use  . Smoking status: Former Smoker    Packs/day: 0.50    Years: 4.00    Pack years: 2.00    Types: Cigarettes    Quit date: 12/13/1963    Years since quitting: 57.0  . Smokeless tobacco: Never Used  Vaping Use  . Vaping Use: Never  used  Substance Use Topics  . Alcohol use: No  . Drug use: Never         OPHTHALMIC EXAM:  Base Eye Exam    Visual Acuity (ETDRS)      Right Left   Dist cc CF at 3' 20/50   Dist ph cc  NI   Correction: Glasses       Tonometry (Tonopen, 11:01 AM)      Right Left   Pressure 05 15       Pupils      Dark Light Shape React APD   Right 4 4 Irregular Minimal None   Left 3 3 Round Minimal None       Visual Fields (Counting fingers)   Pt unable - poor comprehension       Extraocular Movement      Right Left    Full Full       Neuro/Psych    Mood/Affect: Normal       Dilation    Both eyes: 1.0% Mydriacyl, 2.5% Phenylephrine @ 11:08 AM        Slit Lamp and Fundus Exam    External Exam      Right Left   External Normal Normal       Slit Lamp Exam      Right Left   Lids/Lashes Normal Normal   Conjunctiva/Sclera White and quiet White and quiet   Cornea Clear Clear   Anterior Chamber Deep and quiet Deep and quiet   Iris Posterior synechiae, focal at 8:00 to the capsule, no neo- Round and reactive   Lens Posterior chamber intraocular lens Posterior chamber intraocular lens   Anterior Vitreous Oil clear Normal       Fundus Exam      Right Left   Posterior Vitreous Silicone Oil clear Normal   Disc 1+ Pallor Normal   C/D Ratio 0.5 0.5   Macula Attached,Cystoid macular edema, Epiretinal membrane, Macular thickening no macular thickening, no hemorrhage, Retinal pigment epithelial mottling, Hard drusen   Vessels Normal Engorged vein   Periphery Attached 360 degrees, good buckle, CR scarring, no new holes or tears Normal, no holes or tears, attached          IMAGING AND PROCEDURES  Imaging and Procedures for 12/19/20  OCT, Retina - OU - Both Eyes       Right Eye Quality was borderline. Scan locations included subfoveal. Central Foveal Thickness: 638. Progression has been stable.   Left Eye  Quality was good. Scan locations included subfoveal. Central  Foveal Thickness: 270. Progression has been stable. Findings include normal observations.   Notes OD media clear, diffuse intraretinal fluid CME OD, no change over time clear oil.  OS near normal findings                ASSESSMENT/PLAN:  Cystoid macular edema of right eye Stable over time observe  Hypotony, right eye Stable over time with slight improvement of pressures of intraocular pressures of 5 from a previous 04 status post YAG capsulotomy for fibrotic capsule which may have been triggering circumferential centripetal traction on ciliary body released by YAG incisional capsulotomy  Early stage nonexudative age-related macular degeneration of left eye The nature of age--related macular degeneration was discussed with the patient as well as the distinction between dry and wet types. Checking an Amsler Grid daily with advice to return immediately should a distortion develop, was given to the patient. The patient 's smoking status now and in the past was determined and advice based on the AREDS study was provided regarding the consumption of antioxidant supplements. AREDS 2 vitamin formulation was recommended. Consumption of dark leafy vegetables and fresh fruits of various colors was recommended. Treatment modalities for wet macular degeneration particularly the use of intravitreal injections of anti-blood vessel growth factors was discussed with the patient. Avastin, Lucentis, and Eylea are the available options. On occasion, therapy includes the use of photodynamic therapy and thermal laser. Stressed to the patient do not rub eyes.  Patient was advised to check Amsler Grid daily and return immediately if changes are noted. Instructions on using the grid were given to the patient. All patient questions were answered.  Patient's left eye does not reach the level of intermediate ARMD thus does not qualify for the recommendation for vitamin use      ICD-10-CM   1. Cystoid macular edema  of right eye  H35.351 OCT, Retina - OU - Both Eyes  2. Hypotony, right eye  H44.40   3. Early stage nonexudative age-related macular degeneration of left eye  H35.3121     1.  OU, at maximal capability of functioning.  2.  OD, no silicone oil complications, no hyperOlean, no oil emulsification in the anterior chamber.  With clear silicone and hypotony no recommendation for oil removal should be undertaken right eye  3.  OS with excellent acuity and low risk for progression to wet AMD  Ophthalmic Meds Ordered this visit:  No orders of the defined types were placed in this encounter.      Return in about 9 months (around 09/18/2021) for DILATE OU, OCT.  There are no Patient Instructions on file for this visit.   Explained the diagnoses, plan, and follow up with the patient and they expressed understanding.  Patient expressed understanding of the importance of proper follow up care.   Clent Demark Ashley Bultema M.D. Diseases & Surgery of the Retina and Vitreous [facility]ina [facility]abetic [place] 12/19/20     Abbreviations: M myopia (nearsighted); A astigmatism; H hyperopia (farsighted); P presbyopia; Mrx spectacle prescription;  CTL contact lenses; OD right eye; OS left eye; OU both eyes  XT exotropia; ET esotropia; PEK punctate epithelial keratitis; PEE punctate epithelial erosions; DES dry eye syndrome; MGD meibomian gland dysfunction; ATs artificial tears; PFAT's preservative free artificial tears; [place] nuclear sclerotic cataract; PSC posterior subcapsular cataract; ERM epi-retinal membrane; PVD posterior vitreous detachment; RD retinal detachment; DM diabetes mellitus; DR diabetic retinopathy; NPDR non-proliferative diabetic retinopathy; PDR  proliferative diabetic retinopathy; CSME clinically significant macular edema; DME diabetic macular edema; dbh dot blot hemorrhages; CWS cotton wool spot; POAG primary open angle glaucoma; C/D cup-to-disc ratio; HVF humphrey visual field; GVF goldmann visual  field; OCT optical coherence tomography; IOP intraocular pressure; BRVO Branch retinal vein occlusion; CRVO central retinal vein occlusion; CRAO central retinal artery occlusion; BRAO branch retinal artery occlusion; RT retinal tear; SB scleral buckle; PPV pars plana vitrectomy; VH Vitreous hemorrhage; PRP panretinal laser photocoagulation; IVK intravitreal kenalog; VMT vitreomacular traction; MH Macular hole;  NVD neovascularization of the disc; NVE neovascularization elsewhere; AREDS age related eye disease study; ARMD age related macular degeneration; POAG primary open angle glaucoma; EBMD epithelial/anterior basement membrane dystrophy; ACIOL anterior chamber intraocular lens; IOL intraocular lens; PCIOL posterior chamber intraocular lens; Phaco/IOL phacoemulsification with intraocular lens placement; [place] photorefractive keratectomy; LASIK laser assisted in situ keratomileusis; HTN hypertension; DM diabetes mellitus; COPD chronic obstructive pulmonary disease

## 2020-12-19 NOTE — Assessment & Plan Note (Signed)
Stable over time with slight improvement of pressures of intraocular pressures of 5 from a previous 04 status post YAG capsulotomy for fibrotic capsule which may have been triggering circumferential centripetal traction on ciliary body released by YAG incisional capsulotomy

## 2020-12-20 DIAGNOSIS — M6281 Muscle weakness (generalized): Secondary | ICD-10-CM | POA: Diagnosis not present

## 2020-12-20 DIAGNOSIS — R2681 Unsteadiness on feet: Secondary | ICD-10-CM | POA: Diagnosis not present

## 2020-12-20 DIAGNOSIS — R278 Other lack of coordination: Secondary | ICD-10-CM | POA: Diagnosis not present

## 2020-12-20 DIAGNOSIS — R5383 Other fatigue: Secondary | ICD-10-CM | POA: Diagnosis not present

## 2020-12-21 DIAGNOSIS — R2681 Unsteadiness on feet: Secondary | ICD-10-CM | POA: Diagnosis not present

## 2020-12-21 DIAGNOSIS — C9111 Chronic lymphocytic leukemia of B-cell type in remission: Secondary | ICD-10-CM | POA: Diagnosis not present

## 2020-12-21 DIAGNOSIS — E782 Mixed hyperlipidemia: Secondary | ICD-10-CM | POA: Diagnosis not present

## 2020-12-21 DIAGNOSIS — I10 Essential (primary) hypertension: Secondary | ICD-10-CM | POA: Diagnosis not present

## 2020-12-21 DIAGNOSIS — M6281 Muscle weakness (generalized): Secondary | ICD-10-CM | POA: Diagnosis not present

## 2020-12-22 DIAGNOSIS — M6281 Muscle weakness (generalized): Secondary | ICD-10-CM | POA: Diagnosis not present

## 2020-12-22 DIAGNOSIS — R2681 Unsteadiness on feet: Secondary | ICD-10-CM | POA: Diagnosis not present

## 2020-12-25 DIAGNOSIS — F4323 Adjustment disorder with mixed anxiety and depressed mood: Secondary | ICD-10-CM | POA: Diagnosis not present

## 2020-12-26 DIAGNOSIS — I10 Essential (primary) hypertension: Secondary | ICD-10-CM | POA: Diagnosis not present

## 2020-12-26 DIAGNOSIS — R278 Other lack of coordination: Secondary | ICD-10-CM | POA: Diagnosis not present

## 2020-12-26 DIAGNOSIS — K219 Gastro-esophageal reflux disease without esophagitis: Secondary | ICD-10-CM | POA: Diagnosis not present

## 2020-12-26 DIAGNOSIS — R5383 Other fatigue: Secondary | ICD-10-CM | POA: Diagnosis not present

## 2020-12-26 DIAGNOSIS — E782 Mixed hyperlipidemia: Secondary | ICD-10-CM | POA: Diagnosis not present

## 2020-12-26 DIAGNOSIS — R2681 Unsteadiness on feet: Secondary | ICD-10-CM | POA: Diagnosis not present

## 2020-12-26 DIAGNOSIS — M6281 Muscle weakness (generalized): Secondary | ICD-10-CM | POA: Diagnosis not present

## 2020-12-27 DIAGNOSIS — M6281 Muscle weakness (generalized): Secondary | ICD-10-CM | POA: Diagnosis not present

## 2020-12-27 DIAGNOSIS — R278 Other lack of coordination: Secondary | ICD-10-CM | POA: Diagnosis not present

## 2020-12-27 DIAGNOSIS — R2681 Unsteadiness on feet: Secondary | ICD-10-CM | POA: Diagnosis not present

## 2020-12-27 DIAGNOSIS — R5383 Other fatigue: Secondary | ICD-10-CM | POA: Diagnosis not present

## 2020-12-29 ENCOUNTER — Other Ambulatory Visit: Payer: Self-pay | Admitting: Family Medicine

## 2020-12-29 DIAGNOSIS — R5383 Other fatigue: Secondary | ICD-10-CM | POA: Diagnosis not present

## 2020-12-29 DIAGNOSIS — R2681 Unsteadiness on feet: Secondary | ICD-10-CM | POA: Diagnosis not present

## 2020-12-29 DIAGNOSIS — M6281 Muscle weakness (generalized): Secondary | ICD-10-CM | POA: Diagnosis not present

## 2020-12-29 DIAGNOSIS — R278 Other lack of coordination: Secondary | ICD-10-CM | POA: Diagnosis not present

## 2021-01-02 DIAGNOSIS — R5383 Other fatigue: Secondary | ICD-10-CM | POA: Diagnosis not present

## 2021-01-02 DIAGNOSIS — R2681 Unsteadiness on feet: Secondary | ICD-10-CM | POA: Diagnosis not present

## 2021-01-02 DIAGNOSIS — M6281 Muscle weakness (generalized): Secondary | ICD-10-CM | POA: Diagnosis not present

## 2021-01-02 DIAGNOSIS — R278 Other lack of coordination: Secondary | ICD-10-CM | POA: Diagnosis not present

## 2021-01-03 DIAGNOSIS — F0390 Unspecified dementia without behavioral disturbance: Secondary | ICD-10-CM | POA: Diagnosis not present

## 2021-01-04 DIAGNOSIS — R2681 Unsteadiness on feet: Secondary | ICD-10-CM | POA: Diagnosis not present

## 2021-01-04 DIAGNOSIS — M6281 Muscle weakness (generalized): Secondary | ICD-10-CM | POA: Diagnosis not present

## 2021-01-05 DIAGNOSIS — R5383 Other fatigue: Secondary | ICD-10-CM | POA: Diagnosis not present

## 2021-01-05 DIAGNOSIS — R278 Other lack of coordination: Secondary | ICD-10-CM | POA: Diagnosis not present

## 2021-01-05 DIAGNOSIS — M6281 Muscle weakness (generalized): Secondary | ICD-10-CM | POA: Diagnosis not present

## 2021-01-05 DIAGNOSIS — R2681 Unsteadiness on feet: Secondary | ICD-10-CM | POA: Diagnosis not present

## 2021-01-06 DIAGNOSIS — M6281 Muscle weakness (generalized): Secondary | ICD-10-CM | POA: Diagnosis not present

## 2021-01-06 DIAGNOSIS — R2681 Unsteadiness on feet: Secondary | ICD-10-CM | POA: Diagnosis not present

## 2021-01-09 DIAGNOSIS — R2681 Unsteadiness on feet: Secondary | ICD-10-CM | POA: Diagnosis not present

## 2021-01-09 DIAGNOSIS — M6281 Muscle weakness (generalized): Secondary | ICD-10-CM | POA: Diagnosis not present

## 2021-01-10 DIAGNOSIS — R278 Other lack of coordination: Secondary | ICD-10-CM | POA: Diagnosis not present

## 2021-01-10 DIAGNOSIS — R2681 Unsteadiness on feet: Secondary | ICD-10-CM | POA: Diagnosis not present

## 2021-01-10 DIAGNOSIS — M6281 Muscle weakness (generalized): Secondary | ICD-10-CM | POA: Diagnosis not present

## 2021-01-10 DIAGNOSIS — R5383 Other fatigue: Secondary | ICD-10-CM | POA: Diagnosis not present

## 2021-01-11 ENCOUNTER — Other Ambulatory Visit (INDEPENDENT_AMBULATORY_CARE_PROVIDER_SITE_OTHER): Payer: Self-pay | Admitting: Ophthalmology

## 2021-01-11 DIAGNOSIS — M6281 Muscle weakness (generalized): Secondary | ICD-10-CM | POA: Diagnosis not present

## 2021-01-11 DIAGNOSIS — R2681 Unsteadiness on feet: Secondary | ICD-10-CM | POA: Diagnosis not present

## 2021-01-14 DIAGNOSIS — R5383 Other fatigue: Secondary | ICD-10-CM | POA: Diagnosis not present

## 2021-01-14 DIAGNOSIS — R2681 Unsteadiness on feet: Secondary | ICD-10-CM | POA: Diagnosis not present

## 2021-01-14 DIAGNOSIS — M6281 Muscle weakness (generalized): Secondary | ICD-10-CM | POA: Diagnosis not present

## 2021-01-14 DIAGNOSIS — R278 Other lack of coordination: Secondary | ICD-10-CM | POA: Diagnosis not present

## 2021-01-16 DIAGNOSIS — R5383 Other fatigue: Secondary | ICD-10-CM | POA: Diagnosis not present

## 2021-01-16 DIAGNOSIS — M6281 Muscle weakness (generalized): Secondary | ICD-10-CM | POA: Diagnosis not present

## 2021-01-16 DIAGNOSIS — R278 Other lack of coordination: Secondary | ICD-10-CM | POA: Diagnosis not present

## 2021-01-16 DIAGNOSIS — R2681 Unsteadiness on feet: Secondary | ICD-10-CM | POA: Diagnosis not present

## 2021-01-18 DIAGNOSIS — R2681 Unsteadiness on feet: Secondary | ICD-10-CM | POA: Diagnosis not present

## 2021-01-18 DIAGNOSIS — M6281 Muscle weakness (generalized): Secondary | ICD-10-CM | POA: Diagnosis not present

## 2021-01-20 DIAGNOSIS — R5383 Other fatigue: Secondary | ICD-10-CM | POA: Diagnosis not present

## 2021-01-20 DIAGNOSIS — M6281 Muscle weakness (generalized): Secondary | ICD-10-CM | POA: Diagnosis not present

## 2021-01-20 DIAGNOSIS — R2681 Unsteadiness on feet: Secondary | ICD-10-CM | POA: Diagnosis not present

## 2021-01-20 DIAGNOSIS — R278 Other lack of coordination: Secondary | ICD-10-CM | POA: Diagnosis not present

## 2021-01-23 DIAGNOSIS — B379 Candidiasis, unspecified: Secondary | ICD-10-CM | POA: Diagnosis not present

## 2021-01-23 DIAGNOSIS — K219 Gastro-esophageal reflux disease without esophagitis: Secondary | ICD-10-CM | POA: Diagnosis not present

## 2021-01-23 DIAGNOSIS — I10 Essential (primary) hypertension: Secondary | ICD-10-CM | POA: Diagnosis not present

## 2021-01-24 DIAGNOSIS — R2681 Unsteadiness on feet: Secondary | ICD-10-CM | POA: Diagnosis not present

## 2021-01-24 DIAGNOSIS — R278 Other lack of coordination: Secondary | ICD-10-CM | POA: Diagnosis not present

## 2021-01-24 DIAGNOSIS — M6281 Muscle weakness (generalized): Secondary | ICD-10-CM | POA: Diagnosis not present

## 2021-01-24 DIAGNOSIS — R5383 Other fatigue: Secondary | ICD-10-CM | POA: Diagnosis not present

## 2021-01-25 DIAGNOSIS — R2681 Unsteadiness on feet: Secondary | ICD-10-CM | POA: Diagnosis not present

## 2021-01-25 DIAGNOSIS — M6281 Muscle weakness (generalized): Secondary | ICD-10-CM | POA: Diagnosis not present

## 2021-01-27 DIAGNOSIS — R278 Other lack of coordination: Secondary | ICD-10-CM | POA: Diagnosis not present

## 2021-01-27 DIAGNOSIS — R2681 Unsteadiness on feet: Secondary | ICD-10-CM | POA: Diagnosis not present

## 2021-01-27 DIAGNOSIS — R5383 Other fatigue: Secondary | ICD-10-CM | POA: Diagnosis not present

## 2021-01-27 DIAGNOSIS — M6281 Muscle weakness (generalized): Secondary | ICD-10-CM | POA: Diagnosis not present

## 2021-01-28 DIAGNOSIS — R278 Other lack of coordination: Secondary | ICD-10-CM | POA: Diagnosis not present

## 2021-01-28 DIAGNOSIS — M6281 Muscle weakness (generalized): Secondary | ICD-10-CM | POA: Diagnosis not present

## 2021-01-28 DIAGNOSIS — R2681 Unsteadiness on feet: Secondary | ICD-10-CM | POA: Diagnosis not present

## 2021-01-28 DIAGNOSIS — R5383 Other fatigue: Secondary | ICD-10-CM | POA: Diagnosis not present

## 2021-01-30 DIAGNOSIS — M6281 Muscle weakness (generalized): Secondary | ICD-10-CM | POA: Diagnosis not present

## 2021-01-30 DIAGNOSIS — R2681 Unsteadiness on feet: Secondary | ICD-10-CM | POA: Diagnosis not present

## 2021-01-31 DIAGNOSIS — F0390 Unspecified dementia without behavioral disturbance: Secondary | ICD-10-CM | POA: Diagnosis not present

## 2021-02-02 DIAGNOSIS — R2681 Unsteadiness on feet: Secondary | ICD-10-CM | POA: Diagnosis not present

## 2021-02-02 DIAGNOSIS — M6281 Muscle weakness (generalized): Secondary | ICD-10-CM | POA: Diagnosis not present

## 2021-02-03 DIAGNOSIS — R278 Other lack of coordination: Secondary | ICD-10-CM | POA: Diagnosis not present

## 2021-02-03 DIAGNOSIS — R2681 Unsteadiness on feet: Secondary | ICD-10-CM | POA: Diagnosis not present

## 2021-02-03 DIAGNOSIS — M6281 Muscle weakness (generalized): Secondary | ICD-10-CM | POA: Diagnosis not present

## 2021-02-03 DIAGNOSIS — R5383 Other fatigue: Secondary | ICD-10-CM | POA: Diagnosis not present

## 2021-02-06 DIAGNOSIS — R278 Other lack of coordination: Secondary | ICD-10-CM | POA: Diagnosis not present

## 2021-02-06 DIAGNOSIS — R2681 Unsteadiness on feet: Secondary | ICD-10-CM | POA: Diagnosis not present

## 2021-02-06 DIAGNOSIS — M6281 Muscle weakness (generalized): Secondary | ICD-10-CM | POA: Diagnosis not present

## 2021-02-06 DIAGNOSIS — R5383 Other fatigue: Secondary | ICD-10-CM | POA: Diagnosis not present

## 2021-02-07 DIAGNOSIS — R2681 Unsteadiness on feet: Secondary | ICD-10-CM | POA: Diagnosis not present

## 2021-02-07 DIAGNOSIS — M6281 Muscle weakness (generalized): Secondary | ICD-10-CM | POA: Diagnosis not present

## 2021-02-08 DIAGNOSIS — M6281 Muscle weakness (generalized): Secondary | ICD-10-CM | POA: Diagnosis not present

## 2021-02-08 DIAGNOSIS — R2681 Unsteadiness on feet: Secondary | ICD-10-CM | POA: Diagnosis not present

## 2021-02-11 DIAGNOSIS — R5383 Other fatigue: Secondary | ICD-10-CM | POA: Diagnosis not present

## 2021-02-11 DIAGNOSIS — R278 Other lack of coordination: Secondary | ICD-10-CM | POA: Diagnosis not present

## 2021-02-11 DIAGNOSIS — R2681 Unsteadiness on feet: Secondary | ICD-10-CM | POA: Diagnosis not present

## 2021-02-11 DIAGNOSIS — M6281 Muscle weakness (generalized): Secondary | ICD-10-CM | POA: Diagnosis not present

## 2021-02-13 DIAGNOSIS — R2681 Unsteadiness on feet: Secondary | ICD-10-CM | POA: Diagnosis not present

## 2021-02-13 DIAGNOSIS — M6281 Muscle weakness (generalized): Secondary | ICD-10-CM | POA: Diagnosis not present

## 2021-02-14 DIAGNOSIS — R278 Other lack of coordination: Secondary | ICD-10-CM | POA: Diagnosis not present

## 2021-02-14 DIAGNOSIS — M6281 Muscle weakness (generalized): Secondary | ICD-10-CM | POA: Diagnosis not present

## 2021-02-14 DIAGNOSIS — R2681 Unsteadiness on feet: Secondary | ICD-10-CM | POA: Diagnosis not present

## 2021-02-14 DIAGNOSIS — R5383 Other fatigue: Secondary | ICD-10-CM | POA: Diagnosis not present

## 2021-02-16 DIAGNOSIS — M6281 Muscle weakness (generalized): Secondary | ICD-10-CM | POA: Diagnosis not present

## 2021-02-16 DIAGNOSIS — R5383 Other fatigue: Secondary | ICD-10-CM | POA: Diagnosis not present

## 2021-02-16 DIAGNOSIS — R2681 Unsteadiness on feet: Secondary | ICD-10-CM | POA: Diagnosis not present

## 2021-02-16 DIAGNOSIS — R278 Other lack of coordination: Secondary | ICD-10-CM | POA: Diagnosis not present

## 2021-02-20 DIAGNOSIS — R2681 Unsteadiness on feet: Secondary | ICD-10-CM | POA: Diagnosis not present

## 2021-02-20 DIAGNOSIS — R278 Other lack of coordination: Secondary | ICD-10-CM | POA: Diagnosis not present

## 2021-02-20 DIAGNOSIS — M6281 Muscle weakness (generalized): Secondary | ICD-10-CM | POA: Diagnosis not present

## 2021-02-20 DIAGNOSIS — R5383 Other fatigue: Secondary | ICD-10-CM | POA: Diagnosis not present

## 2021-02-21 DIAGNOSIS — E782 Mixed hyperlipidemia: Secondary | ICD-10-CM | POA: Diagnosis not present

## 2021-02-21 DIAGNOSIS — I10 Essential (primary) hypertension: Secondary | ICD-10-CM | POA: Diagnosis not present

## 2021-02-21 DIAGNOSIS — K219 Gastro-esophageal reflux disease without esophagitis: Secondary | ICD-10-CM | POA: Diagnosis not present

## 2021-02-22 DIAGNOSIS — R5383 Other fatigue: Secondary | ICD-10-CM | POA: Diagnosis not present

## 2021-02-22 DIAGNOSIS — R2681 Unsteadiness on feet: Secondary | ICD-10-CM | POA: Diagnosis not present

## 2021-02-22 DIAGNOSIS — M6281 Muscle weakness (generalized): Secondary | ICD-10-CM | POA: Diagnosis not present

## 2021-02-22 DIAGNOSIS — R278 Other lack of coordination: Secondary | ICD-10-CM | POA: Diagnosis not present

## 2021-02-28 DIAGNOSIS — R5383 Other fatigue: Secondary | ICD-10-CM | POA: Diagnosis not present

## 2021-02-28 DIAGNOSIS — R2681 Unsteadiness on feet: Secondary | ICD-10-CM | POA: Diagnosis not present

## 2021-02-28 DIAGNOSIS — F0390 Unspecified dementia without behavioral disturbance: Secondary | ICD-10-CM | POA: Diagnosis not present

## 2021-02-28 DIAGNOSIS — R278 Other lack of coordination: Secondary | ICD-10-CM | POA: Diagnosis not present

## 2021-02-28 DIAGNOSIS — M6281 Muscle weakness (generalized): Secondary | ICD-10-CM | POA: Diagnosis not present

## 2021-03-01 DIAGNOSIS — R278 Other lack of coordination: Secondary | ICD-10-CM | POA: Diagnosis not present

## 2021-03-01 DIAGNOSIS — M6281 Muscle weakness (generalized): Secondary | ICD-10-CM | POA: Diagnosis not present

## 2021-03-01 DIAGNOSIS — R5383 Other fatigue: Secondary | ICD-10-CM | POA: Diagnosis not present

## 2021-03-01 DIAGNOSIS — R2681 Unsteadiness on feet: Secondary | ICD-10-CM | POA: Diagnosis not present

## 2021-03-03 DIAGNOSIS — R278 Other lack of coordination: Secondary | ICD-10-CM | POA: Diagnosis not present

## 2021-03-03 DIAGNOSIS — R5383 Other fatigue: Secondary | ICD-10-CM | POA: Diagnosis not present

## 2021-03-03 DIAGNOSIS — M6281 Muscle weakness (generalized): Secondary | ICD-10-CM | POA: Diagnosis not present

## 2021-03-03 DIAGNOSIS — R2681 Unsteadiness on feet: Secondary | ICD-10-CM | POA: Diagnosis not present

## 2021-03-15 DIAGNOSIS — F4323 Adjustment disorder with mixed anxiety and depressed mood: Secondary | ICD-10-CM | POA: Diagnosis not present

## 2021-04-11 DIAGNOSIS — F4323 Adjustment disorder with mixed anxiety and depressed mood: Secondary | ICD-10-CM | POA: Diagnosis not present

## 2021-07-07 ENCOUNTER — Ambulatory Visit: Payer: Medicare PPO | Admitting: Hematology

## 2021-07-07 ENCOUNTER — Other Ambulatory Visit: Payer: Medicare PPO

## 2021-08-29 ENCOUNTER — Non-Acute Institutional Stay: Payer: Self-pay

## 2021-08-29 VITALS — BP 130/80 | HR 80 | Temp 97.3°F | Resp 16 | Wt 128.6 lb

## 2021-08-29 DIAGNOSIS — Z515 Encounter for palliative care: Secondary | ICD-10-CM

## 2021-08-30 ENCOUNTER — Other Ambulatory Visit: Payer: Self-pay

## 2021-09-03 NOTE — Progress Notes (Signed)
PATIENT NAME: Lindsey Patel DOB: 08-04-1927 MRN: [id number]  PRIMARY CARE PROVIDER: Carollee Herter, Alferd Apa, DO  RESPONSIBLE PARTY:  Acct ID - Guarantor Home Phone Work Phone Relationship Acct Type  [id number] [phone]  Self P/F     [place], [place], [place] [address]   COMMUNITY PALLIATIVE CARE RN NOTE    PLAN OF CARE and INTERVENTION:  ADVANCE CARE PLANNING/GOALS OF CARE: Comfort, Safety PATIENT/CAREGIVER EDUCATION: Education regarding Palliative care services DISEASE STATUS:  RN Palliative care visit completed today at the memory care at [facility]. Patient out of bed, well-groomed and dressed appropriately. She is observed to be social and stated to this RN that she "loves life"- which does appear evident.  Patient is awake, smiling and engaging. Palliative care referral made as facility caregivers expressed concerns of cognitive decline, needing more assistance with ADLs. Staff reports that patient is now requiring to be taken to the bathroom or she will find a place, such as a trash can. She requires her tasks to be broken down now with direction and supervision. She is not always able to identify where her room is and will go into other rooms. She can feed herself; meal intake is good. She consumes approximately 75% of her meals.   HISTORY OF PRESENT ILLNESS: This is a 87 year old female with dx including but not limited to Chronic lymphocytic leukemia, memory loss, macular degeneration and history of falls with subsequent injury.  Palliative care has been asked to follow for additional support, goals of care and complex decision making.  CODE STATUS: Full Code PPS: 50%    PHYSICAL EXAM:   Lungs: No audible congestion, LS CTA  Cardiovascular: RRR, No edema, no evidence of chest pain  Skin: Warm, Dry, no visible wounds or rashes Abdomen: No evidence of tenderness, BS positive Neurological: Positive for weakness, progressive cognitive decline         Dietrich Pates, BSN

## 2021-09-18 ENCOUNTER — Encounter (INDEPENDENT_AMBULATORY_CARE_PROVIDER_SITE_OTHER): Payer: Self-pay | Admitting: Ophthalmology

## 2021-09-18 ENCOUNTER — Ambulatory Visit (INDEPENDENT_AMBULATORY_CARE_PROVIDER_SITE_OTHER): Payer: Medicare PPO | Admitting: Ophthalmology

## 2021-09-18 ENCOUNTER — Other Ambulatory Visit: Payer: Self-pay

## 2021-09-18 DIAGNOSIS — H33051 Total retinal detachment, right eye: Secondary | ICD-10-CM | POA: Diagnosis not present

## 2021-09-18 DIAGNOSIS — H35351 Cystoid macular degeneration, right eye: Secondary | ICD-10-CM | POA: Diagnosis not present

## 2021-09-18 DIAGNOSIS — H353121 Nonexudative age-related macular degeneration, left eye, early dry stage: Secondary | ICD-10-CM | POA: Diagnosis not present

## 2021-09-18 NOTE — Progress Notes (Signed)
09/18/2021     CHIEF COMPLAINT Patient presents for  Chief Complaint  Patient presents with   Retina Follow Up      HISTORY OF PRESENT ILLNESS: Lindsey Patel is a 87 y.o. female who presents to the clinic today for:   HPI     Retina Follow Up   Patient presents with  Other.  In both eyes.  This started 9 months ago.  Duration of 9 months.        Comments   9 month f/u OU with OCT  Pt's son reports no observed changes in his mother's vision, states she has been living in an assisted living arrangement in memory care since 11/2020. Pt sdenies any visual changes-pt has memory loss.      Last edited by Reather Littler, COA on 09/18/2021 11:18 AM.      Referring physician: Carollee Herter, Alferd Apa, DO [address] [place],  [place] [address]  HISTORICAL INFORMATION:   Selected notes from the MEDICAL RECORD NUMBER       CURRENT MEDICATIONS: Current Outpatient Medications (Ophthalmic Drugs)  Medication Sig   prednisoLONE acetate (PRED FORTE) 1 % ophthalmic suspension INSTILL 1 DROP INTO RIGHT EYE 2 TIMES A DAY   No current facility-administered medications for this visit. (Ophthalmic Drugs)   Current Outpatient Medications (Other)  Medication Sig   amLODipine (NORVASC) 5 MG tablet Take 1 tablet (5 mg total) by mouth daily.   aspirin EC 81 MG tablet Take 81 mg by mouth daily.   calcium carbonate (OS-CAL) 600 MG TABS Take 600 mg by mouth 2 (two) times daily with a meal.   cetirizine (ZYRTEC) 10 MG tablet Take by mouth.   diclofenac Sodium (VOLTAREN) 1 % GEL    donepezil (ARICEPT) 10 MG tablet Take 1 tablet (10 mg total) by mouth at bedtime.   fish oil-omega-3 fatty acids 1000 MG capsule Take 2 g by mouth as needed.    Flaxseed Oil (LINSEED OIL) OIL Take by mouth.   lisinopril (ZESTRIL) 40 MG tablet TAKE 1 TABLET(40 MG) BY MOUTH DAILY   Multiple Vitamin (MULTIVITAMIN) tablet Take 1 tablet by mouth daily.   naftifine (NAFTIN) 1 % cream Apply topically  daily.   nystatin (MYCOSTATIN/NYSTOP) powder Apply topically 4 (four) times daily.   simvastatin (ZOCOR) 40 MG tablet TAKE 1 TABLET BY MOUTH EVERY DAY AT 6 PM   No current facility-administered medications for this visit. (Other)      REVIEW OF SYSTEMS:    ALLERGIES Allergies  Allergen Reactions   Pneumococcal Vaccines Other (See Comments)    Had a local skin reaction    PAST MEDICAL HISTORY Past Medical History:  Diagnosis Date   Chronic lymphocytic leukemia 09/22/2007   Qualifier: Diagnosis of  By: Jerold Coombe     Fracture of elbow 04/01/2018   Fracture of hip, closed ([facility]) 04/01/2018   LEFT   Hyperlipidemia    Hypertension    Leukemia ([facility])    Osteoporosis    Past Surgical History:  Procedure Laterality Date   CATARACT EXTRACTION  6/03   left   CATARACT EXTRACTION  4/09   right   ORIF ELBOW FRACTURE Left 04/02/2018   Procedure: OPEN REDUCTION INTERNAL FIXATION (ORIF) ELBOW/OLECRANON FRACTURE;  Surgeon: Shona Needles, MD;  Location: [facility];  Service: Orthopedics;  Laterality: Left;   ORIF HIP FRACTURE Left 04/02/2018   Procedure: OPEN REDUCTION INTERNAL FIXATION HIP;  Surgeon: Shona Needles, MD;  Location: [facility];  Service: Orthopedics;  Laterality: Left;   TUBAL LIGATION      FAMILY HISTORY Family History  Problem Relation Age of Onset   Stroke Mother    Cancer Father        stomach   Parkinsonism Brother    Cancer Brother        lung   Heart disease Brother        MI   Alzheimer's disease Sister    Stroke Other    Alzheimer's disease Other    Dementia Other    Parkinsonism Other     SOCIAL HISTORY Social History   Tobacco Use   Smoking status: Former    Packs/day: 0.50    Years: 4.00    Pack years: 2.00    Types: Cigarettes    Quit date: 12/13/1963    Years since quitting: 57.8   Smokeless tobacco: Never  Vaping Use   Vaping Use: Never used  Substance Use Topics   Alcohol use: No   Drug use: Never         OPHTHALMIC  EXAM:  Base Eye Exam     Visual Acuity (ETDRS)       Right Left   [place] CF@3ft  20/32  Used singular letters for checking VA due to the memory loss and the questionable understanding.        Tonometry (Tonopen, 11:28 AM)       Right Left   Pressure 5 13         Pupils       Dark Light Shape React APD   Right 4 4 Irregular Minimal None   Left 3 2.5 Round Brisk None         Visual Fields   Unable to perform due to questionable understanding from memory loss        Extraocular Movement       Right Left    Full, Ortho Full, Ortho         Neuro/Psych     Mood/Affect: Normal         Dilation     Both eyes: 1.0% Mydriacyl, 2.5% Phenylephrine @ 11:27 AM           Slit Lamp and Fundus Exam     External Exam       Right Left   External Normal Normal         Slit Lamp Exam       Right Left   Lids/Lashes Normal Normal   Conjunctiva/Sclera White and quiet White and quiet   Cornea Clear Clear   Anterior Chamber Deep and quiet Deep and quiet   Iris Posterior synechiae, focal at 8:00 to the capsule, no neo- Round and reactive   Lens Posterior chamber intraocular lens Posterior chamber intraocular lens   Anterior Vitreous Oil clear Normal         Fundus Exam       Right Left   Posterior Vitreous Silicone Oil clear Normal   Disc 1+ Pallor Normal   C/D Ratio 0.5 0.5   Macula Attached,Cystoid macular edema, Epiretinal membrane, Macular thickening no macular thickening, no hemorrhage, Retinal pigment epithelial mottling, Hard drusen   Vessels Normal Engorged vein   Periphery Attached 360 degrees, good buckle, CR scarring, no new holes or tears Normal, no holes or tears, attached            IMAGING AND PROCEDURES  Imaging and Procedures for 09/18/21  OCT, Retina -  OU - Both Eyes       Right Eye Quality was borderline. Scan locations included subfoveal. Central Foveal Thickness: 610. Progression has been stable. Findings include  abnormal foveal contour, cystoid macular edema.   Left Eye Quality was good. Scan locations included subfoveal. Central Foveal Thickness: 263. Progression has been stable. Findings include normal observations.   Notes OD media clear, diffuse intraretinal fluid CME OD, no change over time clear oil.  OS near normal findings             ASSESSMENT/PLAN:  Old retinal detachment, total/subtotal, right Still with clear silicone oil retina attached.  We will continue to monitor and observe this "spare tire eye".  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 Cystoid macular edema of right eye  H35.351 OCT, Retina - OU - Both Eyes    2. Old retinal detachment, total/subtotal, right  H33.051     3. Early stage nonexudative age-related macular degeneration of left eye  H35.3121       1.  OD with history of chronic retinal detachment, stable with oil in place.  We will continue to observe and leave this in place OS to minimize chance of recurrence  2.  Chronic CME OD stable  3.  OS with minor ARMD, no impact on acuity stable acuity will observe  Ophthalmic Meds Ordered this visit:  No orders of the defined types were placed in this encounter.      Return in about 9 months (around 06/18/2022) for DILATE OU, OCT, COLOR FP.  There are no Patient Instructions on file for this visit.   Explained the diagnoses, plan, and follow up with the patient and they expressed understanding.  Patient expressed understanding of the importance of proper follow up care.   Clent Demark Dennette Faulconer M.D. Diseases & Surgery of the Retina and Vitreous Retina [facility] Diabetic [place] 09/18/21     Abbreviations: M myopia (nearsighted); A astigmatism; H  hyperopia (farsighted); P presbyopia; Mrx spectacle prescription;  CTL contact lenses; OD right eye; OS left eye; OU both eyes  XT exotropia; ET esotropia; PEK punctate epithelial keratitis; PEE punctate epithelial erosions; DES dry eye syndrome; MGD meibomian gland dysfunction; ATs artificial tears; PFAT's preservative free artificial tears; [place] nuclear sclerotic cataract; PSC posterior subcapsular cataract; ERM epi-retinal membrane; PVD posterior vitreous detachment; RD retinal detachment; DM diabetes mellitus; DR diabetic retinopathy; NPDR non-proliferative diabetic retinopathy; PDR proliferative diabetic retinopathy; CSME clinically significant macular edema; DME diabetic macular edema; dbh dot blot hemorrhages; CWS cotton wool spot; POAG primary open angle glaucoma; C/D cup-to-disc ratio; HVF humphrey visual field; GVF goldmann visual field; OCT optical coherence tomography; IOP intraocular pressure; BRVO Branch retinal vein occlusion; CRVO central retinal vein occlusion; CRAO central retinal artery occlusion; BRAO branch retinal artery occlusion; RT retinal tear; SB scleral buckle; PPV pars plana vitrectomy; VH Vitreous hemorrhage; PRP panretinal laser photocoagulation; IVK intravitreal kenalog; VMT vitreomacular traction; MH Macular hole;  NVD neovascularization of the disc; NVE neovascularization elsewhere; AREDS age related eye disease study; ARMD age related macular degeneration; POAG primary open angle glaucoma; EBMD epithelial/anterior basement membrane dystrophy; ACIOL anterior chamber intraocular lens; IOL intraocular lens; PCIOL posterior chamber intraocular lens; Phaco/IOL phacoemulsification with intraocular lens placement; PRK photorefractive  keratectomy; LASIK laser assisted in situ keratomileusis; HTN hypertension; DM diabetes mellitus; COPD chronic obstructive pulmonary disease

## 2021-09-18 NOTE — Assessment & Plan Note (Signed)
OD, chronic CME, best to leave the oil in place so as to prevent CME worsening and/or retinal detachment recurring

## 2021-09-18 NOTE — Assessment & Plan Note (Signed)
Still with clear silicone oil retina attached.  We will continue to monitor and observe this "spare tire eye".

## 2021-09-18 NOTE — Assessment & Plan Note (Signed)
Minor stage OS, no impact on acuity with excellent acuity

## 2022-03-21 ENCOUNTER — Emergency Department (HOSPITAL_COMMUNITY): Payer: Medicare PPO

## 2022-03-21 ENCOUNTER — Other Ambulatory Visit: Payer: Self-pay

## 2022-03-21 ENCOUNTER — Emergency Department (HOSPITAL_COMMUNITY)
Admission: EM | Admit: 2022-03-21 | Discharge: 2022-03-21 | Disposition: A | Discharge status: Home / Self Care | Payer: Medicare PPO | Attending: Emergency Medicine | Admitting: Emergency Medicine

## 2022-03-21 DIAGNOSIS — W19XXXA Unspecified fall, initial encounter: Secondary | ICD-10-CM | POA: Diagnosis not present

## 2022-03-21 DIAGNOSIS — S0510XA Contusion of eyeball and orbital tissues, unspecified eye, initial encounter: Secondary | ICD-10-CM | POA: Insufficient documentation

## 2022-03-21 DIAGNOSIS — W01198A Fall on same level from slipping, tripping and stumbling with subsequent striking against other object, initial encounter: Secondary | ICD-10-CM | POA: Insufficient documentation

## 2022-03-21 DIAGNOSIS — S02411A LeFort I fracture, initial encounter for closed fracture: Secondary | ICD-10-CM | POA: Diagnosis not present

## 2022-03-21 DIAGNOSIS — S0292XA Unspecified fracture of facial bones, initial encounter for closed fracture: Secondary | ICD-10-CM | POA: Insufficient documentation

## 2022-03-21 DIAGNOSIS — Z7982 Long term (current) use of aspirin: Secondary | ICD-10-CM | POA: Insufficient documentation

## 2022-03-21 DIAGNOSIS — S0231XA Fracture of orbital floor, right side, initial encounter for closed fracture: Secondary | ICD-10-CM | POA: Diagnosis not present

## 2022-03-21 DIAGNOSIS — F0390 Unspecified dementia without behavioral disturbance: Secondary | ICD-10-CM | POA: Insufficient documentation

## 2022-03-21 DIAGNOSIS — S0993XA Unspecified injury of face, initial encounter: Secondary | ICD-10-CM | POA: Diagnosis present

## 2022-03-21 DIAGNOSIS — H05239 Hemorrhage of unspecified orbit: Secondary | ICD-10-CM

## 2022-03-21 LAB — CBC
HCT: 39.1 % (ref 36.0–46.0)
Hemoglobin: 13 g/dL (ref 12.0–15.0)
MCH: 30.2 pg (ref 26.0–34.0)
MCHC: 33.2 g/dL (ref 30.0–36.0)
MCV: 90.7 fL (ref 80.0–100.0)
Platelets: 298 10*3/uL (ref 150–400)
RBC: 4.31 MIL/uL (ref 3.87–5.11)
RDW: 14 % (ref 11.5–15.5)
WBC: 15.9 10*3/uL — ABNORMAL HIGH (ref 4.0–10.5)
nRBC: 0 % (ref 0.0–0.2)

## 2022-03-21 LAB — BASIC METABOLIC PANEL
Anion gap: 9 (ref 5–15)
BUN: 13 mg/dL (ref 8–23)
CO2: 22 mmol/L (ref 22–32)
Calcium: 8.8 mg/dL — ABNORMAL LOW (ref 8.9–10.3)
Chloride: 105 mmol/L (ref 98–111)
Creatinine, Ser: 0.52 mg/dL (ref 0.44–1.00)
GFR, Estimated: >60 mL/min (ref >60)
Glucose, Bld: 135 mg/dL — ABNORMAL HIGH (ref 70–99)
Potassium: 4 mmol/L (ref 3.5–5.1)
Sodium: 136 mmol/L (ref 135–145)

## 2022-03-21 MED ORDER — ACETAMINOPHEN 160 MG/5ML PO SUSP
15.0000 mg/kg | Freq: Four times a day (QID) | ORAL | 0 refills | Status: DC | PRN
Start: 2022-03-21 — End: 2022-03-21

## 2022-03-21 MED ORDER — ACETAMINOPHEN 160 MG/5ML PO SUSP: 15.0000 mg/kg | Freq: Four times a day (QID) | ORAL | 0 refills | Status: DC | PRN

## 2022-03-21 MED ORDER — OXYMETAZOLINE HCL 0.05 % NA SOLN
1.0000 | Freq: Once | NASAL | Status: AC
  Administered 2022-03-21: 1 via NASAL
  Filled 2022-03-21: qty 30

## 2022-03-21 NOTE — ED Triage Notes (Signed)
Pt BIB EMS due to a fall. Pt lives at [facility] and fell face forward this morning. Pt has hx iof dementia and has head lac on arrival.

## 2022-03-21 NOTE — Discharge Instructions (Addendum)
Lindsey Patel has several fractures or broken bones in her face.  She was seen by an ENT specialist who felt she could be discharged on a SOFT LIQUID AND PUREE DIET at home.  Please call Outpatient ENT office at [phone] for a follow up appointment in 2 weeks.  Lindsey Patel was seen by Dr Marcelline Deist today.  You can give liquid tylenol for pain at home.  Consider switching all home medications to liquid equivalents, or crush her pills in apple sauce, if possible.  She came home with afrin spray. She can use this once in each nostril at night for 3 nights.  This will help with nosebleeds and pressure in her sinuses.  It would also help for her to sleep propped up on 2 or 3 pillows at night to relieve some of the pressure in her face and sinuses  She has a small amount of bleeding behind her right eyeball from her injuries.  This should be examined by an eye doctor in 1 week if possible.  You are welcome to contact her retinal specialist, otherwise you can contact the ophthalmologist provided on these discharge instructions.  If Lindsey Patel complains of loss of vision in the right eye, or the right eye is bulging in the socket, or she has another fall and facial injury, she needs to come back to the ER.

## 2022-03-21 NOTE — Consult Note (Signed)
Reason for Consult: Facial trauma Referring Physician: ER attending  Lindsey Patel is an 88 y.o. female.  HPI: This 88 year old female fell from a standing position and suffered facial trauma.  She is demented and lives in an assisted living facility.  She denies any malocclusion or change in vision.  She denies pain with opening and closing her mouth.  Past Medical History:  Diagnosis Date   Chronic lymphocytic leukemia 09/22/2007   Qualifier: Diagnosis of  By: Jerold Coombe     Fracture of elbow 04/01/2018   Fracture of hip, closed ([facility]) 04/01/2018   LEFT   Hyperlipidemia    Hypertension    Leukemia ([facility])    Osteoporosis     Past Surgical History:  Procedure Laterality Date   CATARACT EXTRACTION  6/03   left   CATARACT EXTRACTION  4/09   right   ORIF ELBOW FRACTURE Left 04/02/2018   Procedure: OPEN REDUCTION INTERNAL FIXATION (ORIF) ELBOW/OLECRANON FRACTURE;  Surgeon: Shona Needles, MD;  Location: [facility];  Service: Orthopedics;  Laterality: Left;   ORIF HIP FRACTURE Left 04/02/2018   Procedure: OPEN REDUCTION INTERNAL FIXATION HIP;  Surgeon: Shona Needles, MD;  Location: [facility];  Service: Orthopedics;  Laterality: Left;   TUBAL LIGATION      Family History  Problem Relation Age of Onset   Stroke Mother    Cancer Father        stomach   Parkinsonism Brother    Cancer Brother        lung   Heart disease Brother        MI   Alzheimer's disease Sister    Stroke Other    Alzheimer's disease Other    Dementia Other    Parkinsonism Other     Social History:  reports that she quit smoking about 58 years ago. Her smoking use included cigarettes. She has a 2.00 pack-year smoking history. She has never used smokeless tobacco. She reports that she does not drink alcohol and does not use drugs.  Allergies:  Allergies  Allergen Reactions   Pneumococcal Vaccines Other (See Comments)    Had a local skin reaction    Medications: I have reviewed the patient's current  medications.  Results for orders placed or performed during the hospital encounter of 03/21/22 (from the past 48 hour(s))  Basic metabolic panel     Status: Abnormal   Collection Time: 03/21/22  1:23 PM  Result Value Ref Range   Sodium 136 135 - 145 mmol/L   Potassium 4.0 3.5 - 5.1 mmol/L   Chloride 105 98 - 111 mmol/L   CO2 22 22 - 32 mmol/L   Glucose, Bld 135 (H) 70 - 99 mg/dL    Comment: Glucose reference range applies only to samples taken after fasting for at least 8 hours.   BUN 13 8 - 23 mg/dL   Creatinine, Ser 0.52 0.44 - 1.00 mg/dL   Calcium 8.8 (L) 8.9 - 10.3 mg/dL   GFR, Estimated >60 >60 mL/min    Comment: (NOTE) Calculated using the CKD-EPI Creatinine Equation (2021)    Anion gap 9 5 - 15    Comment: Performed at [facility] [address] [place] [address]  CBC     Status: Abnormal   Collection Time: 03/21/22  1:23 PM  Result Value Ref Range   WBC 15.9 (H) 4.0 - 10.5 K/uL   RBC 4.31 3.87 - 5.11 MIL/uL   Hemoglobin 13.0 12.0 -  15.0 g/dL   HCT 39.1 36.0 - 46.0 %   MCV 90.7 80.0 - 100.0 fL   MCH 30.2 26.0 - 34.0 pg   MCHC 33.2 30.0 - 36.0 g/dL   RDW 14.0 11.5 - 15.5 %   Platelets 298 150 - 400 K/uL   nRBC 0.0 0.0 - 0.2 %    Comment: Performed at [facility], [place] [address] [place], [place] [address]    CT HEAD WO CONTRAST (5MM)  Result Date: 03/21/2022 CLINICAL DATA:  [place] onto face. EXAM: CT HEAD WITHOUT CONTRAST TECHNIQUE: Contiguous axial images were obtained from the base of the skull through the vertex without intravenous contrast. RADIATION DOSE REDUCTION: This exam was performed according to the departmental dose-optimization program which includes automated exposure control, adjustment of the mA and/or kV according to patient size and/or use of iterative reconstruction technique. COMPARISON:  MRI head 08/22/2018 FINDINGS: Brain: Generalized atrophy. Negative for hydrocephalus. Patchy white matter hypodensity bilaterally compatible  with chronic microvascular ischemia. Hypodensity left internal capsule anteriorly consistent with infarct of indeterminate age. This was not present 2019. Negative for acute infarct, acute hemorrhage, or mass. Vascular: Negative for hyperdense vessel Skull: Multiple facial fractures. Fractures of the maxilla bilaterally. Fracture of the nasal septum and nasal bone. Fracture of the maxillary sinus bilaterally. No skull fracture. Sinuses/Orbits: Blood in the maxillary sinus bilaterally with high density air-fluid levels. Mastoid clear bilaterally. Prior cataract extraction bilaterally. Hyperdense posterior chamber on the right which could be due to acute blood or surgical injection. Other: None IMPRESSION: Atrophy and chronic microvascular ischemia. Hypodensity left internal capsule consistent with infarct of indeterminate age. Multiple facial fractures. Recommend CT maxillofacial. Hyperdense posterior chamber right globe could be due to acute hemorrhage Electronically Signed   By: Franchot Gallo M.D.   On: 03/21/2022 12:45   CT Maxillofacial Wo Contrast  Result Date: 03/21/2022 CLINICAL DATA:  Provided history: Facial trauma, blunt; multiple facial fractures as noted on head CT. EXAM: CT MAXILLOFACIAL WITHOUT CONTRAST TECHNIQUE: Multidetector CT imaging of the maxillofacial structures was performed. Multiplanar CT image reconstructions were also generated. RADIATION DOSE REDUCTION: This exam was performed according to the departmental dose-optimization program which includes automated exposure control, adjustment of the mA and/or kV according to patient size and/or use of iterative reconstruction technique. COMPARISON:  Head CT performed earlier today 03/21/2022. FINDINGS: Osseous: There are multiple acute maxillofacial fractures (many of which are comminuted), as follows. Acute, displaced fractures of the medial and lateral right pterygoid plates, and of the medial left pterygoid plate. Acute fractures within  the anterior, medial and posterolateral walls of the right maxillary sinus. Minimally displaced acute fractures of the right orbital floor. Subtle diastasis at the right frontozygomatic suture. Acute fractures of the anterior, medial and posterolateral walls of the left maxillary sinus. Acute, displaced fracture of the nasal septum. Mildly displaced fractures of the left nasal bone. Nondisplaced acute fracture along the right nasofrontal suture. Orbits: Minimally displaced acute fracture of the right orbital floor. There is increased density within the posterior chamber of the right globe, which may be postprocedural or may reflect acute hemorrhage. No retrobulbar hematoma. Sinuses: Large fluid levels within the bilateral maxillary sinuses which at least partially reflect hemorrhage. Small fluid levels within the bilateral sphenoid sinuses. Mild-to-moderate scattered mucosal thickening and fluid within the bilateral ethmoid sinuses. Soft tissues: Right periorbital and right greater than left maxillofacial hematomas. Scattered subcutaneous gas within the right greater than left maxillofacial soft tissues. Limited intracranial: Separately reported on same  day head CT. IMPRESSION: Extensive acute maxillofacial fractures, as detailed. The majority of these fractures have an incomplete LeFort type pattern (most closely resembling a LeFort type 1 pattern). Acute minimally displaced fractures of the right orbital floor are also present. There is also mild diastasis at the right frontozygomatic suture. Increased density of the posterior chamber of the right globe, which may be postprocedural or may reflect acute hemorrhage. Clinical correlation is recommended. Paranasal sinus opacification, as described, and likely reflecting a combination of posttraumatic hemorrhage and inflammatory sinus disease. Right periorbital and right greater than left maxillofacial hematomas. Scattered subcutaneous gas within the right greater than  left maxillofacial soft tissues. Electronically Signed   By: Kellie Simmering D.O.   On: 03/21/2022 14:22    Review of Systems Blood pressure (!) 151/78, pulse 85, temperature 98.9 F (37.2 C), temperature source Oral, resp. rate 15, height '5\' 2"'$  (1.575 m), weight 55.8 kg, SpO2 97 %. Physical Exam  Patient is cooperative but confused at baseline She has significant bruising mainly on the right side of the face Nasal septum without hematoma and external nasal bones tender Midface stable No malocclusion or tenderness on palpation of the mandible Temporomandibular joints properly positioned and functional Neck without adenopathy or trauma [place] normal without mastoid bruising or tenderness  CT scan-  This patient clearly fell on the right side of her face based on her pattern of injury.  She has essentially a Eddie Dibbles fracture but only on the right side.  The left medial and lateral buttresses are intact.  The right lateral buttress is slightly depressed and the medial and lateral pterygoids on the right appear fractured.  The mandible appears normal.  Orbital hemorrhage being managed by ophthalmology.  Assessment/Plan:  Lateral orbital hemorrhage per ophthalmology Facial fracture Dementia  Given the absence of malocclusion and the stable midface due to absence of disarticulating fractures on the left side of the face, I do not think surgery is needed.  I would also hesitate to place this patient in [facility] given her baseline mental status and dementia.  I have discussed this with her sons and they would also like to avoid surgery.  I have recommended that she observe a soft diet for 3 to 4 weeks to allow this to heal.  She should follow-up if she develops malocclusion, chronic pain, or other problems.  Boyce Medici. 03/21/2022, 3:40 PM

## 2022-03-21 NOTE — ED Provider Notes (Signed)
[facility] EMERGENCY DEPARTMENT Provider Note   CSN: [id number] Arrival date & time: 03/21/22  1132     History {Add pertinent medical, surgical, social history, OB history to HPI:1} Chief Complaint  Patient presents with   Lindsey Patel is a 88 y.o. female with history of dementia presenting from her nursing facility with a fall and facial injury.  Patient reportedly fell and struck her face on the ground, had some bleeding from the nose.  She has dementia cannot provide further history.  Supplemental history [facility] by EMS.  HPI     Home Medications Prior to Admission medications   Medication Sig Start Date End Date Taking? Authorizing Provider  amLODipine (NORVASC) 5 MG tablet Take 1 tablet (5 mg total) by mouth daily. 05/27/20   Ann Held, DO  aspirin EC 81 MG tablet Take 81 mg by mouth daily.    [provider]  calcium carbonate (OS-CAL) 600 MG TABS Take 600 mg by mouth 2 (two) times daily with a meal.    [provider]  cetirizine (ZYRTEC) 10 MG tablet Take by mouth.    [provider]  diclofenac Sodium (VOLTAREN) 1 % GEL  09/02/19   [provider]  donepezil (ARICEPT) 10 MG tablet Take 1 tablet (10 mg total) by mouth at bedtime. 06/17/20   Roma Schanz R, DO  fish oil-omega-3 fatty acids 1000 MG capsule Take 2 g by mouth as needed.     [provider]  Flaxseed Oil (LINSEED OIL) OIL Take by mouth.    [provider]  lisinopril (ZESTRIL) 40 MG tablet TAKE 1 TABLET(40 MG) BY MOUTH DAILY 09/02/20   Ann Held, DO  Multiple Vitamin (MULTIVITAMIN) tablet Take 1 tablet by mouth daily.    [provider]  naftifine (NAFTIN) 1 % cream Apply topically daily. 09/16/18   Ann Held, DO  nystatin (MYCOSTATIN/NYSTOP) powder Apply topically 4 (four) times daily. 06/05/18   Roma Schanz R, DO  prednisoLONE acetate (PRED FORTE) 1 % ophthalmic suspension  INSTILL 1 DROP INTO RIGHT EYE 2 TIMES A DAY 01/18/21   Rankin, Clent Demark, MD  simvastatin (ZOCOR) 40 MG tablet TAKE 1 TABLET BY MOUTH EVERY DAY AT 6 PM 08/29/20   Ann Held, DO      Allergies    Pneumococcal vaccines    Review of Systems   Review of Systems  Physical Exam Updated Vital Signs There were no vitals taken for this visit. Physical Exam Constitutional:      General: She is not in acute distress. HENT:     Head: Normocephalic and atraumatic.     Comments: Dried blood from bilateral nares, no septal hematoma, no active nasal hemorrhage, swelling and ecchymosis of the bridge of the nose without visible deformity, periorbital edema patient is here with mechanical fall and appears to have an isolated head injury on exam.  CT scan of the head ordered and personally reviewed showing and reviewed, per my interpretation showing Eyes:     Conjunctiva/sclera: Conjunctivae normal.     Pupils: Pupils are equal, round, and reactive to light.  Cardiovascular:     Rate and Rhythm: Normal rate and regular rhythm.  Pulmonary:     Effort: Pulmonary effort is normal. No respiratory distress.  Skin:    General: Skin is warm and dry.  Neurological:     General: No focal deficit present.  Mental Status: She is alert. Mental status is at baseline.    ED Results / Procedures / Treatments   Labs (all labs ordered are listed, but only abnormal results are displayed) Labs Reviewed - No data to display  EKG None  Radiology No results found.  Procedures Procedures  {Document cardiac monitor, telemetry assessment procedure when appropriate:1}  Medications Ordered in ED Medications - No data to display  ED Course/ Medical Decision Making/ A&P                           Medical Decision Making Amount and/or Complexity of Data Reviewed Radiology: ordered.  Risk OTC drugs.    Pt here with mechanical fall isolated head injury  CT scan head ordered and reviewed, per my  interpretation showing ***  Nose bleed controlled with afrin No gaping forehead lacerations requiring sutures  Supplemental hx by EMS Medical records reviewed at bedside - medications - she is not on A/C  No other traumatic injuries noted to warrant further imaging at this time  Stable for discharge back to facility.  {Document critical care time when appropriate:1} {Document review of labs and clinical decision tools ie heart score, Chads2Vasc2 etc:1}  {Document your independent review of radiology images, and any outside records:1} {Document your discussion with family members, caretakers, and with consultants:1} {Document social determinants of health affecting pt's care:1} {Document your decision making why or why not admission, treatments were needed:1} Final Clinical Impression(s) / ED Diagnoses Final diagnoses:  None    Rx / DC Orders ED Discharge Orders     None

## 2022-03-21 NOTE — ED Notes (Signed)
Patient transported to CT 

## 2022-04-02 ENCOUNTER — Encounter (INDEPENDENT_AMBULATORY_CARE_PROVIDER_SITE_OTHER): Payer: Medicare PPO | Admitting: Ophthalmology

## 2022-04-12 ENCOUNTER — Ambulatory Visit (INDEPENDENT_AMBULATORY_CARE_PROVIDER_SITE_OTHER): Payer: Medicare PPO | Admitting: Ophthalmology

## 2022-04-12 ENCOUNTER — Encounter (INDEPENDENT_AMBULATORY_CARE_PROVIDER_SITE_OTHER): Payer: Self-pay | Admitting: Ophthalmology

## 2022-04-12 DIAGNOSIS — H4440 Unspecified hypotony of eye: Secondary | ICD-10-CM | POA: Diagnosis not present

## 2022-04-12 DIAGNOSIS — H1820 Unspecified corneal edema: Secondary | ICD-10-CM | POA: Insufficient documentation

## 2022-04-12 DIAGNOSIS — H35351 Cystoid macular degeneration, right eye: Secondary | ICD-10-CM | POA: Diagnosis not present

## 2022-04-12 NOTE — Assessment & Plan Note (Signed)
OD recommend observe, likely corneal edema from endothelial touch for oil migration into the anterior chamber

## 2022-04-12 NOTE — Progress Notes (Signed)
04/12/2022     CHIEF COMPLAINT Patient presents for  Chief Complaint  Patient presents with   Eye Problem      HISTORY OF PRESENT ILLNESS: Lindsey Patel is a 88 y.o. female who presents to the clinic today for:   HPI   Bleeding in OD from a fall, pt request appt. a couple weeks from initial call on 6/1. Patient was in the ER May 31, and does not have notes from hospital with them. She was in the ER from the morning to the mid afternoon, per patients son. The fall broke her nose, she had three fractures on her right side of her face. They noticed when they did the CT scan she had a small bleed behind her right eye." Patient does not use prescription eye drops currently. Patient has dementia, patient cannot complete vision test. Last edited by Laurin Coder on 04/12/2022  1:55 PM.      Referring physician: Carollee Herter, Alferd Apa, DO [address] [place],  [place] [address]  HISTORICAL INFORMATION:   Selected notes from the MEDICAL RECORD NUMBER       CURRENT MEDICATIONS: Current Outpatient Medications (Ophthalmic Drugs)  Medication Sig   prednisoLONE acetate (PRED FORTE) 1 % ophthalmic suspension INSTILL 1 DROP INTO RIGHT EYE 2 TIMES A DAY   No current facility-administered medications for this visit. (Ophthalmic Drugs)   Current Outpatient Medications (Other)  Medication Sig   acetaminophen (TYLENOL) 160 MG/5ML suspension Take 26.2 mLs (838.4 mg total) by mouth every 6 (six) hours as needed for moderate pain or mild pain.   amLODipine (NORVASC) 5 MG tablet Take 1 tablet (5 mg total) by mouth daily.   aspirin EC 81 MG tablet Take 81 mg by mouth daily.   calcium carbonate (OS-CAL) 600 MG TABS Take 600 mg by mouth 2 (two) times daily with a meal.   cetirizine (ZYRTEC) 10 MG tablet Take by mouth.   diclofenac Sodium (VOLTAREN) 1 % GEL    donepezil (ARICEPT) 10 MG tablet Take 1 tablet (10 mg total) by mouth at bedtime.   fish oil-omega-3 fatty acids 1000  MG capsule Take 2 g by mouth as needed.    Flaxseed Oil (LINSEED OIL) OIL Take by mouth.   lisinopril (ZESTRIL) 40 MG tablet TAKE 1 TABLET(40 MG) BY MOUTH DAILY   Multiple Vitamin (MULTIVITAMIN) tablet Take 1 tablet by mouth daily.   naftifine (NAFTIN) 1 % cream Apply topically daily.   nystatin (MYCOSTATIN/NYSTOP) powder Apply topically 4 (four) times daily.   simvastatin (ZOCOR) 40 MG tablet TAKE 1 TABLET BY MOUTH EVERY DAY AT 6 PM   No current facility-administered medications for this visit. (Other)      REVIEW OF SYSTEMS: ROS   Negative for: Constitutional, Gastrointestinal, Neurological, Skin, Genitourinary, Musculoskeletal, HENT, Endocrine, Cardiovascular, Eyes, Respiratory, Psychiatric, Allergic/Imm, Heme/Lymph Last edited by Hurman Horn, MD on 04/12/2022  2:27 PM.       ALLERGIES Allergies  Allergen Reactions   Pneumococcal Vaccines Other (See Comments)    Had a local skin reaction    PAST MEDICAL HISTORY Past Medical History:  Diagnosis Date   Chronic lymphocytic leukemia 09/22/2007   Qualifier: Diagnosis of  By: Jerold Coombe     Fracture of elbow 04/01/2018   Fracture of hip, closed ([place]) 04/01/2018   LEFT   Hyperlipidemia    Hypertension    Leukemia ([place])    Osteoporosis    Past Surgical History:  Procedure Laterality Date   CATARACT EXTRACTION  6/03   left   CATARACT EXTRACTION  4/09   right   ORIF ELBOW FRACTURE Left 04/02/2018   Procedure: OPEN REDUCTION INTERNAL FIXATION (ORIF) ELBOW/OLECRANON FRACTURE;  Surgeon: Shona Needles, MD;  Location: [facility];  Service: Orthopedics;  Laterality: Left;   ORIF HIP FRACTURE Left 04/02/2018   Procedure: OPEN REDUCTION INTERNAL FIXATION HIP;  Surgeon: Shona Needles, MD;  Location: [facility];  Service: Orthopedics;  Laterality: Left;   TUBAL LIGATION      FAMILY HISTORY Family History  Problem Relation Age of Onset   Stroke Mother    Cancer Father        stomach   Parkinsonism Brother    Cancer Brother         lung   Heart disease Brother        MI   Alzheimer's disease Sister    Stroke Other    Alzheimer's disease Other    Dementia Other    Parkinsonism Other     SOCIAL HISTORY Social History   Tobacco Use   Smoking status: Former    Packs/day: 0.50    Years: 4.00    Total pack years: 2.00    Types: Cigarettes    Quit date: 12/13/1963    Years since quitting: 58.3   Smokeless tobacco: Never  Vaping Use   Vaping Use: Never used  Substance Use Topics   Alcohol use: No   Drug use: Never         OPHTHALMIC EXAM:  Base Eye Exam     Visual Acuity (ETDRS)       Right Left   Dist [place] Unable Unable  Patient is unable to complete vision test due to dementia."  not cooperative         Tonometry (Tonopen, 2:02 PM)       Right Left   Pressure soft 24  Palpation globe OD, SOFT.        Pupils       Dark Light APD   Right Unable to see     Left 4 3 None  Patient squeezing eyes closed when I am looking at eyes.        Extraocular Movement       Right Left    Full Full         Neuro/Psych     Mood/Affect: Normal         Dilation     Both eyes: 1.0% Mydriacyl, 2.5% Phenylephrine @ 2:02 PM           Slit Lamp and Fundus Exam     External Exam       Right Left   External Normal Normal         Slit Lamp Exam       Right Left   Lids/Lashes Normal Normal   Conjunctiva/Sclera White and quiet White and quiet   Cornea central cloudines, Clear   Anterior Chamber oil in ac, likely with pupil occlusion Deep and quiet   [place]ris Posterior synechiae, focal at 8:00 to the capsule, no neo- Round and reactive   Lens Posterior chamber intraocular lens Posterior chamber intraocular lens   Anterior Vitreous Oil clear Normal         Fundus Exam       Right Left   Posterior Vitreous Silicone Oil clear Normal   Disc 1+ Pallor Normal   C/D Ratio 0.5 0.5   Macula  Attached,Cystoid macular edema, Epiretinal membrane, Macular thickening no macular  thickening, no hemorrhage, Retinal pigment epithelial mottling, Hard drusen   Vessels Normal Engorged vein   Periphery Attached 360 degrees, good buckle, CR scarring, no new holes or tears Normal, no holes or tears, attached            IMAGING AND PROCEDURES  Imaging and Procedures for 04/12/22  OCT, Retina - OU - Both Eyes       Right Eye Quality was borderline. Scan locations included subfoveal. Central Foveal Thickness: 610. Progression has been stable. Findings include abnormal foveal contour, cystoid macular edema.   Left Eye Quality was good. Scan locations included subfoveal. Central Foveal Thickness: 259. Progression has been stable. Findings include normal observations.   Notes No views OD  OS normal     Color Fundus Photography Optos - OU - Both Eyes       Left Eye Progression has no prior data. Disc findings include normal observations. Periphery : normal observations.   Notes OD with cloudy media.  Some element of this actually is probably the cornea.  From corneal edema see comments  Diffuse red reflex, some details of vessels and nerve are visible.  There may be some element of hemorrhage posteriorly but nonetheless no appropriate therapy available for the patient and his overall condition  OS incidental on the nerve of an old shunt vessel with no evidence of neovascularization normal vasculature presently clear media             ASSESSMENT/PLAN:  Hypotony, right eye OD, still with low pressure despite oil migration into the anterior chamber and likely some element of pupil occlusion yet the peripheral angle is deep.  No pain in the globe with palpation  Corneal edema of right eye OD recommend observe, likely corneal edema from endothelial touch for oil migration into the anterior chamber     ICD-10-CM   1. Cystoid macular edema of right eye  H35.351 OCT, Retina - OU - Both Eyes    Color Fundus Photography Optos - OU - Both Eyes    2.  Hypotony, right eye  H44.40     3. Corneal edema of right eye  H18.20       1.  OD, no open globe from recent fall.  Thus no reason to operate.  2.  In the interim from previous examination oil has migrated from the back of the eye into the front of the eye court, into the anterior chamber thus this is triggering some corneal edema that is opacity.  In the last to palpation the globe is not tender and the pressure is normal therefore leave the right eye alone with no specific therapy since the patient is comfortable.  3.  The left eye remains stable  Ophthalmic Meds Ordered this visit:  No orders of the defined types were placed in this encounter.      Return in about 9 months (around 01/11/2023) for DILATE OU.  There are no Patient Instructions on file for this visit.   Explained the diagnoses, plan, and follow up with the patient and they expressed understanding.  Patient expressed understanding of the importance of proper follow up care.   Clent Demark Paisleigh Maroney M.D. Diseases & Surgery of the Retina and Vitreous [facility] 04/12/22     Abbreviations: M myopia (nearsighted); A astigmatism; H hyperopia (farsighted); P presbyopia; Mrx spectacle prescription;  CTL contact lenses; OD right eye; OS left eye; OU both  eyes  XT exotropia; ET esotropia; PEK punctate epithelial keratitis; PEE punctate epithelial erosions; DES dry eye syndrome; MGD meibomian gland dysfunction; ATs artificial tears; PFAT's preservative free artificial tears; [place] nuclear sclerotic cataract; PSC posterior subcapsular cataract; ERM epi-retinal membrane; PVD posterior vitreous detachment; RD retinal detachment; DM diabetes mellitus; DR diabetic retinopathy; NPDR non-proliferative diabetic retinopathy; PDR proliferative diabetic retinopathy; CSME clinically significant macular edema; DME diabetic macular edema; dbh dot blot hemorrhages; CWS cotton wool spot; POAG primary open angle glaucoma; C/D  cup-to-disc ratio; HVF humphrey visual field; GVF goldmann visual field; OCT optical coherence tomography; IOP intraocular pressure; BRVO Branch retinal vein occlusion; CRVO central retinal vein occlusion; CRAO central retinal artery occlusion; BRAO branch retinal artery occlusion; RT retinal tear; SB scleral buckle; PPV pars plana vitrectomy; VH Vitreous hemorrhage; PRP panretinal laser photocoagulation; IVK intravitreal kenalog; VMT vitreomacular traction; MH Macular hole;  NVD neovascularization of the disc; NVE neovascularization elsewhere; AREDS age related eye disease study; ARMD age related macular degeneration; POAG primary open angle glaucoma; EBMD epithelial/anterior basement membrane dystrophy; ACIOL anterior chamber intraocular lens; IOL intraocular lens; PCIOL posterior chamber intraocular lens; Phaco/IOL phacoemulsification with intraocular lens placement; [place] photorefractive keratectomy; LASIK laser assisted in situ keratomileusis; HTN hypertension; DM diabetes mellitus; COPD chronic obstructive pulmonary disease

## 2022-04-12 NOTE — Assessment & Plan Note (Addendum)
OD, still with low pressure despite oil migration into the anterior chamber and likely some element of pupil occlusion yet the peripheral angle is deep.  No pain in the globe with palpation

## 2022-06-10 ENCOUNTER — Encounter (HOSPITAL_BASED_OUTPATIENT_CLINIC_OR_DEPARTMENT_OTHER): Payer: Self-pay

## 2022-06-10 ENCOUNTER — Other Ambulatory Visit: Payer: Self-pay

## 2022-06-10 ENCOUNTER — Emergency Department (HOSPITAL_BASED_OUTPATIENT_CLINIC_OR_DEPARTMENT_OTHER)
Admission: EM | Admit: 2022-06-10 | Discharge: 2022-06-10 | Disposition: A | Discharge status: Home / Self Care | Payer: Medicare PPO | Attending: Emergency Medicine | Admitting: Emergency Medicine

## 2022-06-10 ENCOUNTER — Emergency Department (HOSPITAL_BASED_OUTPATIENT_CLINIC_OR_DEPARTMENT_OTHER): Payer: Medicare PPO | Admitting: Radiology

## 2022-06-10 DIAGNOSIS — S52571A Other intraarticular fracture of lower end of right radius, initial encounter for closed fracture: Secondary | ICD-10-CM | POA: Diagnosis not present

## 2022-06-10 DIAGNOSIS — G309 Alzheimer's disease, unspecified: Secondary | ICD-10-CM | POA: Diagnosis not present

## 2022-06-10 DIAGNOSIS — S62396A Other fracture of fifth metacarpal bone, right hand, initial encounter for closed fracture: Secondary | ICD-10-CM

## 2022-06-10 DIAGNOSIS — W19XXXA Unspecified fall, initial encounter: Secondary | ICD-10-CM

## 2022-06-10 DIAGNOSIS — W010XXA Fall on same level from slipping, tripping and stumbling without subsequent striking against object, initial encounter: Secondary | ICD-10-CM | POA: Insufficient documentation

## 2022-06-10 DIAGNOSIS — S52611A Displaced fracture of right ulna styloid process, initial encounter for closed fracture: Secondary | ICD-10-CM | POA: Insufficient documentation

## 2022-06-10 DIAGNOSIS — Y92129 Unspecified place in nursing home as the place of occurrence of the external cause: Secondary | ICD-10-CM | POA: Diagnosis not present

## 2022-06-10 DIAGNOSIS — Z7982 Long term (current) use of aspirin: Secondary | ICD-10-CM | POA: Insufficient documentation

## 2022-06-10 DIAGNOSIS — Z79899 Other long term (current) drug therapy: Secondary | ICD-10-CM | POA: Insufficient documentation

## 2022-06-10 DIAGNOSIS — S52501A Unspecified fracture of the lower end of right radius, initial encounter for closed fracture: Secondary | ICD-10-CM

## 2022-06-10 DIAGNOSIS — S6991XA Unspecified injury of right wrist, hand and finger(s), initial encounter: Secondary | ICD-10-CM | POA: Diagnosis present

## 2022-06-10 NOTE — ED Triage Notes (Signed)
Pt BIB daughter from [facility] after falling yesterday. Facility xrayed R arm today and report a fracture. Did not hit head, no LOC. No thinners. H/x Alzheimer's.

## 2022-06-10 NOTE — ED Provider Notes (Signed)
[facility] Provider Note   CSN: [id number] Arrival date & time: 06/10/22  1750     History  Chief Complaint  Patient presents with   Fall   Arm Injury    Lindsey Patel is a 88 y.o. female with medical history significant for Alzheimer's who presents for evaluation mechanical fall.  Trip and fall at the facility yesterday.  No head injury.  Fell on outstretched hand.  She has been ambulatory since.  Daughter here states patient is at baseline mentation.  Patient denies any other pain aside from her wrist.  No fever, nausea, vomiting, headache, back pain, chest pain, abdominal pain, lower extremity pain  HPI     Home Medications Prior to Admission medications   Medication Sig Start Date End Date Taking? Authorizing Provider  acetaminophen (TYLENOL) 160 MG/5ML suspension Take 26.2 mLs (838.4 mg total) by mouth every 6 (six) hours as needed for moderate pain or mild pain. 03/21/22   Wyvonnia Dusky, MD  amLODipine (NORVASC) 5 MG tablet Take 1 tablet (5 mg total) by mouth daily. 05/27/20   Ann Held, DO  aspirin EC 81 MG tablet Take 81 mg by mouth daily.    [provider]  calcium carbonate (OS-CAL) 600 MG TABS Take 600 mg by mouth 2 (two) times daily with a meal.    [provider]  cetirizine (ZYRTEC) 10 MG tablet Take by mouth.    [provider]  diclofenac Sodium (VOLTAREN) 1 % GEL  09/02/19   [provider]  donepezil (ARICEPT) 10 MG tablet Take 1 tablet (10 mg total) by mouth at bedtime. 06/17/20   Roma Schanz R, DO  fish oil-omega-3 fatty acids 1000 MG capsule Take 2 g by mouth as needed.     [provider]  Flaxseed Oil (LINSEED OIL) OIL Take by mouth.    [provider]  lisinopril (ZESTRIL) 40 MG tablet TAKE 1 TABLET(40 MG) BY MOUTH DAILY 09/02/20   Ann Held, DO  Multiple Vitamin (MULTIVITAMIN) tablet Take 1 tablet by mouth daily.    [provider]   naftifine (NAFTIN) 1 % cream Apply topically daily. 09/16/18   Ann Held, DO  nystatin (MYCOSTATIN/NYSTOP) powder Apply topically 4 (four) times daily. 06/05/18   Roma Schanz R, DO  prednisoLONE acetate (PRED FORTE) 1 % ophthalmic suspension INSTILL 1 DROP INTO RIGHT EYE 2 TIMES A DAY 01/18/21   Rankin, Clent Demark, MD  simvastatin (ZOCOR) 40 MG tablet TAKE 1 TABLET BY MOUTH EVERY DAY AT 6 PM 08/29/20   Ann Held, DO      Allergies    Pneumococcal vaccines    Review of Systems   Review of Systems  Constitutional: Negative.   HENT: Negative.    Respiratory: Negative.    Cardiovascular: Negative.   Gastrointestinal: Negative.   Genitourinary: Negative.   Musculoskeletal:        Right wrist/ hand pain  Neurological: Negative.   All other systems reviewed and are negative.   Physical Exam Updated Vital Signs BP (!) 155/80 (BP Location: Right Arm)   Pulse 73   Temp 98.4 F (36.9 C) (Oral)   Resp 18   Ht '5\' 1"'$  (1.549 m)   Wt 52.2 kg   SpO2 95%   BMI 21.73 kg/m  Physical Exam Vitals and nursing note reviewed.  Constitutional:      General: She is not in acute distress.  Appearance: She is well-developed. She is not ill-appearing, toxic-appearing or diaphoretic.  HENT:     Head: Normocephalic and atraumatic.     Comments: No obvious head trauma, hematoma, no raccoon eyes or battle sign    Nose: Nose normal.     Mouth/Throat:     Mouth: Mucous membranes are moist.  Eyes:     Pupils: Pupils are equal, round, and reactive to light.  Neck:     Comments: No midline tenderness, full range of motion Cardiovascular:     Rate and Rhythm: Normal rate.     Pulses:          Radial pulses are 2+ on the right side and 2+ on the left side.       Dorsalis pedis pulses are 2+ on the right side and 2+ on the left side.     Heart sounds: Normal heart sounds.  Pulmonary:     Effort: Pulmonary effort is normal. No respiratory distress.     Breath sounds:  Normal breath sounds.     Comments: Clear bilaterally, speaks in full sentences without difficulty Abdominal:     General: Bowel sounds are normal. There is no distension.     Palpations: Abdomen is soft.     Comments: Soft, Nontender  Musculoskeletal:        General: Normal range of motion.     Cervical back: Normal range of motion.     Comments: Nontender left upper extremity, right upper extremity tender at fifth metacarpal, wrist.  Nontender proximal forearm, humerus or shoulder.  Lifts bilateral shoulders above head No midline C/C/L tenderness Pelvis stable, nontender palpation, no shortening or rotation of legs Bends at bilateral hips, knees and ankles without difficulty Lifts bilateral legs off bed without difficulty  Skin:    General: Skin is warm and dry.     Capillary Refill: Capillary refill takes less than 2 seconds.     Comments: No rashes or lesions.  No lacerations.  Soft tissue swelling about wrist  Neurological:     Mental Status: She is alert. Mental status is at baseline.     Cranial Nerves: Cranial nerves 2-12 are intact.     Sensory: Sensation is intact.     Motor: Motor function is intact.     Comments: At baseline per family Ambulatory Cranial nerves II through XII grossly intact  Psychiatric:        Mood and Affect: Mood normal.     ED Results / Procedures / Treatments   Labs (all labs ordered are listed, but only abnormal results are displayed) Labs Reviewed - No data to display  EKG None  Radiology DG Wrist Complete Right  Result Date: 06/10/2022 CLINICAL DATA:  Post fall with fracture on outside radiograph. EXAM: RIGHT WRIST - COMPLETE 3+ VIEW COMPARISON:  None Available. FINDINGS: Comminuted mildly displaced fracture of the distal radial metaphysis with apex volar angulation. Fracture extends into the distal radioulnar joint. No convincing radiocarpal involvement. There is a minimally displaced ulna styloid fracture. Distal ulnar fracture also  involves the metaphysis with mild displacement and angulation. Fifth metacarpal fracture appears to be segmental with a minimally angulated component involving the base and nondisplaced component involving the distal metaphysis. There is generalized soft tissue edema IMPRESSION: 1. Comminuted mildly displaced and angulated distal radial fracture with extension into the distal radioulnar joint. 2. Mildly displaced and angulated distal ulnar metaphyseal fracture, as well as ulna styloid fracture. 3. Segmental mildly displaced and angulated fifth metacarpal  fracture. Electronically Signed   By: Keith Rake M.D.   On: 06/10/2022 19:14   DG Forearm Right  Result Date: 06/10/2022 CLINICAL DATA:  Post fall with fracture on outside radiograph. EXAM: RIGHT FOREARM - 2 VIEW COMPARISON:  None Available. FINDINGS: Distal radius and ulnar fractures are better assessed on concurrent wrist exam. There is no fracture of the proximal radius or ulna. Elbow alignment is maintained. Soft tissue edema is seen about the distal forearm. IMPRESSION: Distal radius and ulnar fractures better assessed on concurrent wrist exam. No fracture of the proximal radius or ulna. Electronically Signed   By: Keith Rake M.D.   On: 06/10/2022 19:10    Procedures Procedures    Medications Ordered in ED Medications - No data to display  ED Course/ Medical Decision Making/ A&P    88 year old history of Alzheimer's for evaluation of mechanical fall at living facility.  Occurred yesterday.  No head injury.  She is at her baseline mentation per daughter in room.  She is nonfocal neuro exam without deficits.  Has been ambulatory since the fall.  She is midline C/T/L tenderness.  Nontender bilateral lower extremities, full range of motion without difficulty without shortening.  She has diffuse tenderness soft tissue swelling about right wrist and fifth metacarpal.  Apparently at facility had x-ray performed which showed fracture sent  here for further evaluation  Imaging personally viewed and interpreted:  X-ray sure shows comminuted mildly displaced angulated distal radial fracture as well as displaced angulated ulnar fracture.  Also with angulated displaced fifth metacarpal fracture   Discussed with daughter in room.  Will place in splint, sling we will have her follow-up with orthopedics.  family agreeable.  The patient has been appropriately medically screened and/or stabilized in the ED. I have low suspicion for any other emergent medical condition which would require further screening, evaluation or treatment in the ED or require inpatient management.  Patient is hemodynamically stable and in no acute distress.  Patient able to ambulate in department prior to ED.  Evaluation does not show acute pathology that would require ongoing or additional emergent interventions while in the emergency department or further inpatient treatment.  I have discussed the diagnosis with the patient and answered all questions.  Pain is been managed while in the emergency department and patient has no further complaints prior to discharge.  Patient is comfortable with plan discussed in room and is stable for discharge at this time.  I have discussed strict return precautions for returning to the emergency department.  Patient was encouraged to follow-up with PCP/specialist refer to at discharge.                            Medical Decision Making Amount and/or Complexity of Data Reviewed Independent Historian:     Details: Daughter External Data Reviewed: radiology and notes. Radiology: ordered and independent interpretation performed. Decision-making details documented in ED Course.  Risk OTC drugs. Prescription drug management. Parenteral controlled substances. Diagnosis or treatment significantly limited by social determinants of health.          Final Clinical Impression(s) / ED Diagnoses Final diagnoses:  Fall, initial  encounter  Closed fracture of distal end of right radius, unspecified fracture morphology, initial encounter  Other closed intra-articular fracture of distal end of right radius, initial encounter  Closed displaced fracture of other part of fifth metacarpal bone of right hand, initial encounter    Rx / DC  Orders ED Discharge Orders     None         Shizuko Wojdyla A, PA-C 06/10/22 2048    Regan Lemming, MD 06/10/22 2201

## 2022-06-10 NOTE — Discharge Instructions (Addendum)
Sure to follow-up with hand surgery, numbers listed on discharge paperwork  Return for new or worsening symptoms, Tylenol or Motrin as needed for pain

## 2022-06-18 ENCOUNTER — Encounter (INDEPENDENT_AMBULATORY_CARE_PROVIDER_SITE_OTHER): Payer: Medicare PPO | Admitting: Ophthalmology

## 2022-11-09 ENCOUNTER — Non-Acute Institutional Stay: Payer: Self-pay | Admitting: Family Medicine

## 2022-11-09 ENCOUNTER — Encounter: Payer: Self-pay | Admitting: Family Medicine

## 2022-11-09 VITALS — BP 118/54 | HR 79 | Temp 98.3°F | Resp 16

## 2022-11-09 DIAGNOSIS — Z9181 History of falling: Secondary | ICD-10-CM

## 2022-11-09 DIAGNOSIS — R634 Abnormal weight loss: Secondary | ICD-10-CM

## 2022-11-09 DIAGNOSIS — F03B0 Unspecified dementia, moderate, without behavioral disturbance, psychotic disturbance, mood disturbance, and anxiety: Secondary | ICD-10-CM

## 2022-11-09 NOTE — Progress Notes (Signed)
[facility] Consult Note Telephone: [phone]  Fax: [phone]    Date of encounter: 11/09/22 9:26 AM PATIENT NAME: Lindsey Patel [id number]   [phone] (home)  DOB: [birth year] MRN: [id number] PRIMARY CARE PROVIDER:    Lady Patel [address]  [place] [address] [phone]  REFERRING PROVIDER:   Lady Patel [place],  [place] [address] [phone]  RESPONSIBLE PARTY:    Contact Information     Name Relation Home Work [place] Son   [phone]   Patel,lora Daughter   [phone]   Zaelynn, Fuchs   [phone]        I met face to face with patient in [facility] facility. Palliative Care was asked to follow this patient by consultation request of  Lindsey Patel, Lindsey Arbo* to address advance care planning and complex medical decision making. This is a follow up visit   ASSESSMENT , SYMPTOM MANAGEMENT AND PLAN / RECOMMENDATIONS:   Moderate dementia, unspecified type and unspecified if associated with behavioral problem FAST 7 score 6D  No current medical treatment being given for dementia. Currently on Zoloft for mood. Need to discuss goals of care with [facility] POA.  Abnormal Weight loss Had a 7 lb 7.6 oz weight loss from May to August, no current weight. A change of about 6% in 3 months. Recommend at least daily compact protein supplement Encourage pt to eat with peers so she may eat more.  3.  High fall risk with risk of injury Currently pt is at advanced stage of dementia with poor safety awareness. Doubt pt could cognitively retain any instructions given to make progress with PT to decrease risk. Significant hx of osteoporosis and falls with fractures. Agree with decision to have supervised mobility via WC.  Advance Care Planning/Goals of Care: Goals include to maximize quality of life and  symptom management. Identification of a healthcare agent-HC POA is Lindsey Patel [phone], cell Lindsey Patel [phone], cell        [phone]     Lindsey Patel [phone] Updated and signed, notarized 04/07/2018 Review of an existing advance directive document-HC POA.  HC POA designates that [facility] POA can authorize withholding or withdrawal of life prolonging measures, including the right to withhold artificial nutrition and hydration.  Signed and notarized on 04/07/2018.  CODE STATUS: Full Code at present     Follow up Palliative Care Visit: Palliative care will continue to follow for complex medical decision making, advance care planning, and clarification of goals. Return 4-6 weeks or prn.   This visit was coded based on medical decision making (MDM).  PPS: 50%  HOSPICE ELIGIBILITY/DIAGNOSIS: TBD  Chief Complaint:  Palliative Care is continuing to follow pt for chronic medical management in setting of dementia.    HISTORY OF PRESENT ILLNESS:  Lindsey Patel is a 89 y.o. year old female with dementia, hypertension, osteoporosis, history of multiple fractures of vertebra femoral neck and olecranon as well as pelvic and facial fractures.  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  Diet is mechanical soft.  Pt with nonsensical conversation.  She was last seen at [facility] center by Dr. Irene Limbo who at that time recommended  watchful waiting with follow-up in a year.  History obtained from review of EMR, discussion with facility staff for Lindsey Patel.   07/02/22 [facility] Right wrist 2 view xray: Right distal radius and ulna fracture with a 5th metacarpal fracture.  Treated with a splint and replaced by ulnar gutter cast  09/27/22 facility  labs: CMP remarkable for elevated Alk Phos 300, ALT 54, AST 40, Total protein low at 5.6.  Normal CR 0.6 eGFR 83.5. CBC unremarkable, HGB normal at 12.1 Vitamin B 12 normal at 687  I reviewed EMR for available labs, medications, imaging, studies and related documents.  Records reviewed and summarized above.   ROS Nonsensical response to questions  Physical Exam: Current and past weights: weight on 03/21/22 was 122.76 lbs, weight noted on 06/10/22 was 115 lbs Constitutional: NAD General: WNWD EYES: anicteric sclera ENMT: hard of hearing CV: S1S2, RRR, no LE edema, wearing TED hose Pulmonary: CTAB, no increased work of breathing, no cough, room air Abdomen: normo-active BS + 4 quadrants, soft and non tender, no ascites GU: deferred MSK: no sarcopenia, moves all extremities, self propels with wc and occasional assistance, assists with transfers Skin: warm and dry, no rashes or wounds on visible skin Neuro:  no generalized weakness,  noted significant cognitive impairment with likely aphasia, pleasantly confused and non-sensical responses to questions Psych: non-anxious affect, A and O x 1 Hem/lymph/immuno: no widespread bruising   Thank you for the opportunity to participate in the care of Lindsey Patel.  The palliative care team will continue to follow. Please call our office at [phone] if we can be of additional assistance.   Marijo Conception, FNP -C  COVID-19 PATIENT SCREENING TOOL Asked and negative response unless otherwise noted:   Have you had symptoms of covid, tested positive or been in contact with someone with symptoms/positive test in the past 5-10 days?  unknown

## 2022-11-10 ENCOUNTER — Encounter: Payer: Self-pay | Admitting: Family Medicine

## 2022-11-10 DIAGNOSIS — R634 Abnormal weight loss: Secondary | ICD-10-CM | POA: Insufficient documentation

## 2022-11-10 DIAGNOSIS — F03B0 Unspecified dementia, moderate, without behavioral disturbance, psychotic disturbance, mood disturbance, and anxiety: Secondary | ICD-10-CM | POA: Insufficient documentation

## 2022-11-23 ENCOUNTER — Emergency Department (HOSPITAL_COMMUNITY): Payer: Medicare PPO

## 2022-11-23 ENCOUNTER — Other Ambulatory Visit: Payer: Self-pay

## 2022-11-23 ENCOUNTER — Emergency Department (HOSPITAL_COMMUNITY)
Admission: EM | Admit: 2022-11-23 | Discharge: 2022-11-23 | Disposition: A | Discharge status: Skilled Nursing Facility | Payer: Medicare PPO | Attending: Emergency Medicine | Admitting: Emergency Medicine

## 2022-11-23 DIAGNOSIS — I10 Essential (primary) hypertension: Secondary | ICD-10-CM | POA: Diagnosis not present

## 2022-11-23 DIAGNOSIS — W133XXA Fall through floor, initial encounter: Secondary | ICD-10-CM | POA: Diagnosis not present

## 2022-11-23 DIAGNOSIS — S0511XA Contusion of eyeball and orbital tissues, right eye, initial encounter: Secondary | ICD-10-CM | POA: Diagnosis not present

## 2022-11-23 DIAGNOSIS — Z7982 Long term (current) use of aspirin: Secondary | ICD-10-CM | POA: Insufficient documentation

## 2022-11-23 DIAGNOSIS — S51011A Laceration without foreign body of right elbow, initial encounter: Secondary | ICD-10-CM | POA: Diagnosis not present

## 2022-11-23 DIAGNOSIS — F0390 Unspecified dementia without behavioral disturbance: Secondary | ICD-10-CM | POA: Diagnosis not present

## 2022-11-23 DIAGNOSIS — Z79899 Other long term (current) drug therapy: Secondary | ICD-10-CM | POA: Insufficient documentation

## 2022-11-23 DIAGNOSIS — S59901A Unspecified injury of right elbow, initial encounter: Secondary | ICD-10-CM | POA: Diagnosis present

## 2022-11-23 DIAGNOSIS — W19XXXA Unspecified fall, initial encounter: Secondary | ICD-10-CM

## 2022-11-23 DIAGNOSIS — S0083XA Contusion of other part of head, initial encounter: Secondary | ICD-10-CM

## 2022-11-23 NOTE — ED Triage Notes (Signed)
[facility] c[facility]o unwitness[facility]. Per report was found face down in the floor. Pt sustain skin tear on her right elbow and hematoma on her right eye. Pt c/o pain right shoulder. No deformity or bruising noted. Hx dementia.  CBG 132 BP 134/84 HR 76 RR 20 O2sat 100% on RA

## 2022-11-23 NOTE — ED Provider Notes (Signed)
[facility] Provider Note  CSN: [id number] Arrival date & time: 11/23/22 0000  Chief Complaint(s) Fall  HPI Lindsey Patel is a 89 y.o. female with a past medical history listed below including dementia who presents to the emergency department after an unwitnessed fall at his skilled nursing facility.  Patient was found facedown on the floor with bruising and hematoma over the right eye and skin tear of the right elbow.  Patient is not on any blood thinners.  HPI  Past Medical History Past Medical History:  Diagnosis Date   Chronic lymphocytic leukemia 09/22/2007   Qualifier: Diagnosis of  By: Jerold Coombe     Closed displaced fracture of left femoral neck ([place]) 04/02/2018   Closed fracture of left olecranon process 04/02/2018   Closed left hip fracture ([place]) 04/02/2018   Compression fracture of T9 vertebra ([facility]) 09/04/2019   Fracture of elbow 04/01/2018   Fracture of hip, closed ([place]) 04/01/2018   LEFT   Hyperlipidemia    Hypertension    Leukemia ([place])    Osteoporosis    Pelvic fracture ([place]) 04/02/2018   SINUSITIS, ACUTE NOS 06/17/2007   Qualifier: Diagnosis of   By: Jerold Coombe       Patient Active Problem List   Diagnosis Date Noted   Moderate dementia ([place]) 11/10/2022   Weight loss, abnormal 11/10/2022   Corneal edema of right eye 04/12/2022   Facial bones, closed fracture ([place])    Early stage nonexudative age-related macular degeneration of left eye 12/19/2020   Old retinal detachment, total/subtotal, right 02/10/2020   Hypotony, right eye 02/10/2020   Cystoid macular edema of right eye 02/10/2020   Essential hypertension 08/14/2018   Memory loss 05/09/2018   S/p left hip fracture 05/09/2018   Fall 04/04/2015   Anxiety state, unspecified 07/24/2013   Dyspepsia 07/24/2013   Chronic lymphocytic leukemia ([place]) 09/22/2007   Leukocytosis 05/27/2007   Hyperlipidemia 05/07/2007   OSTEOPOROSIS 05/07/2007   Home  Medication(s) Prior to Admission medications   Medication Sig Start Date End Date Taking? Authorizing Provider  amLODipine (NORVASC) 5 MG tablet Take 1 tablet (5 mg total) by mouth daily. 05/27/20   Ann Held, DO  aspirin EC 81 MG tablet Take 81 mg by mouth daily.    [provider]  calcium carbonate (OS-CAL) 600 MG TABS Take 600 mg by mouth 2 (two) times daily with a meal.    [provider]  cetirizine (ZYRTEC) 10 MG tablet Take by mouth.    [provider]  diclofenac Sodium (VOLTAREN) 1 % GEL  09/02/19   [provider]  fish oil-omega-3 fatty acids 1000 MG capsule Take 2 g by mouth as needed.     [provider]  Flaxseed Oil (LINSEED OIL) OIL Take by mouth.    [provider]  lisinopril (ZESTRIL) 40 MG tablet TAKE 1 TABLET(40 MG) BY MOUTH DAILY 09/02/20   Ann Held, DO  Multiple Vitamin (MULTIVITAMIN) tablet Take 1 tablet by mouth daily.    [provider]  naftifine (NAFTIN) 1 % cream Apply topically daily. Patient not taking: Reported on 11/10/2022 09/16/18   Roma Schanz R, DO  nystatin (MYCOSTATIN/NYSTOP) powder Apply topically 4 (four) times daily. 06/05/18   Roma Schanz R, DO  prednisoLONE acetate (PRED FORTE) 1 % ophthalmic suspension INSTILL 1 DROP INTO RIGHT EYE 2 TIMES A DAY 01/18/21   Rankin, Clent Demark, MD  simvastatin (ZOCOR)  40 MG tablet TAKE 1 TABLET BY MOUTH EVERY DAY AT 6 PM Patient not taking: Reported on 11/10/2022 08/29/20   Ann Held, DO                                                                                                                                    Allergies Pneumococcal vaccines  Review of Systems Review of Systems As noted in HPI  Physical Exam Vital Signs  I have reviewed the triage vital signs BP (!) 175/80   Pulse 80   Temp (!) 97 F (36.1 C)   Resp 16   SpO2 99%   Physical Exam Constitutional:      General: She is not in  acute distress.    Appearance: She is well-developed. She is not diaphoretic.     Interventions: Cervical collar in place.  HENT:     Head: Normocephalic. Contusion present. No raccoon eyes, Battle's sign or laceration.      Right Ear: External ear normal.     Left Ear: External ear normal.     Nose: Nose normal.  Eyes:     General: No scleral icterus.       Right eye: No discharge.        Left eye: No discharge.     Conjunctiva/sclera: Conjunctivae normal.     Pupils: Pupils are equal, round, and reactive to light.  Cardiovascular:     Rate and Rhythm: Normal rate and regular rhythm.     Pulses:          Radial pulses are 2+ on the right side and 2+ on the left side.       Dorsalis pedis pulses are 2+ on the right side and 2+ on the left side.     Heart sounds: Normal heart sounds. No murmur heard.    No friction rub. No gallop.  Pulmonary:     Effort: Pulmonary effort is normal. No respiratory distress.     Breath sounds: Normal breath sounds. No stridor. No wheezing.  Abdominal:     General: There is no distension.     Palpations: Abdomen is soft.     Tenderness: There is no abdominal tenderness.  Musculoskeletal:        General: No tenderness.     Right elbow: No swelling or lacerations. No tenderness.     Left elbow: No tenderness.       Arms:     Cervical back: Normal range of motion and neck supple. No bony tenderness.     Thoracic back: No bony tenderness.     Lumbar back: No bony tenderness.     Comments: Clavicles stable. Chest stable to AP/Lat compression. Pelvis stable to Lat compression. No obvious extremity deformity. No chest or abdominal wall contusion.  Skin:    General: Skin is warm and dry.     Findings: No erythema or rash.  Neurological:     Mental Status: She is alert and oriented to person, place, and time.     Comments: Moving all extremities    ED Results and Treatments Labs (all labs ordered are listed, but only abnormal results are  displayed) Labs Reviewed - No data to display                                                                                                                       EKG  EKG Interpretation  Date/Time:    Ventricular Rate:    PR Interval:    QRS Duration:   QT Interval:    QTC Calculation:   R Axis:     Text Interpretation:         Radiology CT Head Wo Contrast  Result Date: 11/23/2022 CLINICAL DATA:  Unwitnessed fall, right eye hematoma EXAM: CT HEAD WITHOUT CONTRAST CT CERVICAL SPINE WITHOUT CONTRAST TECHNIQUE: Multidetector CT imaging of the head and cervical spine was performed following the standard protocol without intravenous contrast. Multiplanar CT image reconstructions of the cervical spine were also generated. RADIATION DOSE REDUCTION: This exam was performed according to the departmental dose-optimization program which includes automated exposure control, adjustment of the mA and/or kV according to patient size and/or use of iterative reconstruction technique. COMPARISON:  CT head dated 03/21/2022. FINDINGS: CT HEAD FINDINGS Brain: No evidence of acute infarction, hemorrhage, hydrocephalus, extra-axial collection or mass lesion/mass effect. Subcortical white matter and periventricular small vessel ischemic changes. Vascular: Mild intracranial atherosclerosis. Skull: Normal. Negative for fracture or focal lesion. Sinuses/Orbits: Hyperdense material in the right maxillary sinus (series 3/image 1). This may be related to prior maxillofacial fractures (noted in May 2023), but if there is concern for acute injury on exam, consider maxillofacial CT. Visualized paranasal sinuses and mastoid air cells are otherwise clear. Prosthetic right globe. Other: Mild soft tissue swelling overlying the right orbit/lateral zygoma. CT CERVICAL SPINE FINDINGS Alignment: Normal cervical lordosis. Skull base and vertebrae: No acute fracture. No primary bone lesion or focal pathologic process. Soft tissues and  spinal canal: No prevertebral fluid or swelling. No visible canal hematoma. Disc levels: Mild degenerative changes at C5-6. Spinal canal is patent. Upper chest: Visualized lung apices are clear. Other: None. IMPRESSION: Soft tissue swelling overlying the right orbit. No evidence of acute intracranial abnormality. Small vessel ischemic changes. No traumatic injury to the cervical spine. Mild degenerative changes. Hyperdense material in the right maxillary sinus, possibly related to prior maxillofacial fractures. However, if there is concern for acute injury on exam, consider maxillofacial CT. Electronically Signed   By: Julian Hy M.D.   On: 11/23/2022 01:20   CT Cervical Spine Wo Contrast  Result Date: 11/23/2022 CLINICAL DATA:  Unwitnessed fall, right eye hematoma EXAM: CT HEAD WITHOUT CONTRAST CT CERVICAL SPINE WITHOUT CONTRAST TECHNIQUE: Multidetector CT imaging of the head and cervical spine was performed following the standard protocol without intravenous contrast. Multiplanar CT image reconstructions of the cervical spine were also generated. RADIATION DOSE  REDUCTION: This exam was performed according to the departmental dose-optimization program which includes automated exposure control, adjustment of the mA and/or kV according to patient size and/or use of iterative reconstruction technique. COMPARISON:  CT head dated 03/21/2022. FINDINGS: CT HEAD FINDINGS Brain: No evidence of acute infarction, hemorrhage, hydrocephalus, extra-axial collection or mass lesion/mass effect. Subcortical white matter and periventricular small vessel ischemic changes. Vascular: Mild intracranial atherosclerosis. Skull: Normal. Negative for fracture or focal lesion. Sinuses/Orbits: Hyperdense material in the right maxillary sinus (series 3/image 1). This may be related to prior maxillofacial fractures (noted in May 2023), but if there is concern for acute injury on exam, consider maxillofacial CT. Visualized paranasal  sinuses and mastoid air cells are otherwise clear. Prosthetic right globe. Other: Mild soft tissue swelling overlying the right orbit/lateral zygoma. CT CERVICAL SPINE FINDINGS Alignment: Normal cervical lordosis. Skull base and vertebrae: No acute fracture. No primary bone lesion or focal pathologic process. Soft tissues and spinal canal: No prevertebral fluid or swelling. No visible canal hematoma. Disc levels: Mild degenerative changes at C5-6. Spinal canal is patent. Upper chest: Visualized lung apices are clear. Other: None. IMPRESSION: Soft tissue swelling overlying the right orbit. No evidence of acute intracranial abnormality. Small vessel ischemic changes. No traumatic injury to the cervical spine. Mild degenerative changes. Hyperdense material in the right maxillary sinus, possibly related to prior maxillofacial fractures. However, if there is concern for acute injury on exam, consider maxillofacial CT. Electronically Signed   By: Julian Hy M.D.   On: 11/23/2022 01:20   DG Pelvis 1-2 Views  Result Date: 11/23/2022 CLINICAL DATA:  Recent fall with pelvic pain, initial encounter EXAM: PELVIS - 2 VIEW COMPARISON:  None Available. FINDINGS: Postsurgical changes are noted proximal left femur. No acute fracture is noted. Degenerative changes of the hip joints are seen. Lumbar degenerative changes are noted as well. Pelvic ring is intact. IMPRESSION: No acute abnormality noted. Electronically Signed   By: Inez Catalina M.D.   On: 11/23/2022 00:59   DG ELBOW COMPLETE RIGHT (3+VIEW)  Result Date: 11/23/2022 CLINICAL DATA:  Recent fall with right elbow pain, initial encounter EXAM: RIGHT ELBOW - COMPLETE 3+ VIEW COMPARISON:  None Available. FINDINGS: There is no evidence of fracture, dislocation, or joint effusion. There is no evidence of arthropathy or other focal bone abnormality. Soft tissues are unremarkable. IMPRESSION: No acute abnormality noted. Electronically Signed   By: Inez Catalina M.D.   On:  11/23/2022 00:58   DG Shoulder Right  Result Date: 11/23/2022 CLINICAL DATA:  Recent fall with right shoulder pain, initial encounter EXAM: RIGHT SHOULDER - 2+ VIEW COMPARISON:  None Available. FINDINGS: There is no evidence of fracture or dislocation. There is no evidence of arthropathy or other focal bone abnormality. Soft tissues are unremarkable. IMPRESSION: No acute abnormality noted. Electronically Signed   By: Inez Catalina M.D.   On: 11/23/2022 00:58    Medications Ordered in ED Medications - No data to display  Procedures Procedures  (including critical care time)  Medical Decision Making / ED Course   Medical Decision Making Amount and/or Complexity of Data Reviewed Radiology: ordered and independent interpretation performed. Decision-making details documented in ED Course.    Unwitnessed fall at a skilled nursing facility History of dementia.  Family at bedside and patient is at her baseline mental status  Will obtain imaging to assess for any serious injuries from the fall. CT head negative for ICH.  CT cervical spine negative.  Plain films of the right shoulder, right elbow pelvis negative. Skin tear bandaged. Son at bedside updated      Final Clinical Impression(s) / ED Diagnoses Final diagnoses:  Fall, initial encounter  Contusion of face, initial encounter  Skin tear of right elbow without complication, initial encounter   The patient appears reasonably screened and/or stabilized for discharge and I doubt any other medical condition or other [facility] requiring further screening, evaluation, or treatment in the ED at this time. I have discussed the findings, Dx and Tx plan with the patient/family who expressed understanding and agree(s) with the plan. Discharge instructions discussed at length. The patient/family was given strict return  precautions who verbalized understanding of the instructions. No further questions at time of discharge.  Disposition: Discharge  Condition: Good  ED Discharge Orders     None       Follow Up: [place], [place] [address] [phone]  Call  to schedule an appointment for close follow up            This chart was dictated using voice recognition software.  Despite best efforts to proofread,  errors can occur which can change the documentation meaning.    Fatima Blank, MD 11/23/22 [phone]

## 2022-11-24 ENCOUNTER — Emergency Department (HOSPITAL_COMMUNITY): Payer: Medicare PPO

## 2022-11-24 ENCOUNTER — Other Ambulatory Visit: Payer: Self-pay

## 2022-11-24 ENCOUNTER — Encounter (HOSPITAL_COMMUNITY): Payer: Self-pay

## 2022-11-24 ENCOUNTER — Emergency Department (HOSPITAL_COMMUNITY)
Admission: EM | Admit: 2022-11-24 | Discharge: 2022-11-24 | Disposition: A | Discharge status: Skilled Nursing Facility | Payer: Medicare PPO | Attending: Emergency Medicine | Admitting: Emergency Medicine

## 2022-11-24 DIAGNOSIS — W19XXXA Unspecified fall, initial encounter: Secondary | ICD-10-CM | POA: Insufficient documentation

## 2022-11-24 DIAGNOSIS — I10 Essential (primary) hypertension: Secondary | ICD-10-CM | POA: Insufficient documentation

## 2022-11-24 DIAGNOSIS — S065X0A Traumatic subdural hemorrhage without loss of consciousness, initial encounter: Secondary | ICD-10-CM | POA: Insufficient documentation

## 2022-11-24 DIAGNOSIS — F0390 Unspecified dementia without behavioral disturbance: Secondary | ICD-10-CM | POA: Insufficient documentation

## 2022-11-24 DIAGNOSIS — S0083XA Contusion of other part of head, initial encounter: Secondary | ICD-10-CM | POA: Insufficient documentation

## 2022-11-24 DIAGNOSIS — Z23 Encounter for immunization: Secondary | ICD-10-CM | POA: Insufficient documentation

## 2022-11-24 DIAGNOSIS — Y92129 Unspecified place in nursing home as the place of occurrence of the external cause: Secondary | ICD-10-CM | POA: Diagnosis not present

## 2022-11-24 DIAGNOSIS — S022XXA Fracture of nasal bones, initial encounter for closed fracture: Secondary | ICD-10-CM | POA: Diagnosis not present

## 2022-11-24 DIAGNOSIS — Z79899 Other long term (current) drug therapy: Secondary | ICD-10-CM | POA: Diagnosis not present

## 2022-11-24 DIAGNOSIS — S065XAA Traumatic subdural hemorrhage with loss of consciousness status unknown, initial encounter: Secondary | ICD-10-CM

## 2022-11-24 DIAGNOSIS — Z7982 Long term (current) use of aspirin: Secondary | ICD-10-CM | POA: Diagnosis not present

## 2022-11-24 DIAGNOSIS — S0990XA Unspecified injury of head, initial encounter: Secondary | ICD-10-CM | POA: Diagnosis present

## 2022-11-24 MED ORDER — TETANUS-DIPHTH-ACELL PERTUSSIS 5-2.5-18.5 LF-MCG/0.5 IM SUSY
0.5000 mL | PREFILLED_SYRINGE | Freq: Once | INTRAMUSCULAR | Status: AC
  Administered 2022-11-24: 0.5 mL via INTRAMUSCULAR
  Filled 2022-11-24: qty 0.5

## 2022-11-24 NOTE — ED Provider Notes (Signed)
[place] EMERGENCY DEPARTMENT AT [facility] Provider Note   CSN: [id number] Arrival date & time: 11/24/22  0020     History  Chief Complaint  Patient presents with   Lindsey Patel is a 89 y.o. female.  The history is provided by the nursing home. The history is limited by the condition of the patient (Dementia).  Fall  She has history of hypertension, hyperlipidemia, chronic lymphocytic leukemia, osteoporosis, dementia and comes in from a skilled nursing facility following an unwitnessed fall.  She apparently suffered significant facial trauma.  Patient is unable to give any history other than "it hurts".  Per nursing home records, she is not on any anticoagulants.   Home Medications Prior to Admission medications   Medication Sig Start Date End Date Taking? Authorizing Provider  amLODipine (NORVASC) 5 MG tablet Take 1 tablet (5 mg total) by mouth daily. 05/27/20   Ann Held, DO  aspirin EC 81 MG tablet Take 81 mg by mouth daily.    [provider]  calcium carbonate (OS-CAL) 600 MG TABS Take 600 mg by mouth 2 (two) times daily with a meal.    [provider]  cetirizine (ZYRTEC) 10 MG tablet Take by mouth.    [provider]  diclofenac Sodium (VOLTAREN) 1 % GEL  09/02/19   [provider]  fish oil-omega-3 fatty acids 1000 MG capsule Take 2 g by mouth as needed.     [provider]  Flaxseed Oil (LINSEED OIL) OIL Take by mouth.    [provider]  lisinopril (ZESTRIL) 40 MG tablet TAKE 1 TABLET(40 MG) BY MOUTH DAILY 09/02/20   Ann Held, DO  Multiple Vitamin (MULTIVITAMIN) tablet Take 1 tablet by mouth daily.    [provider]  naftifine (NAFTIN) 1 % cream Apply topically daily. Patient not taking: Reported on 11/10/2022 09/16/18   Roma Schanz R, DO  nystatin (MYCOSTATIN/NYSTOP) powder Apply topically 4 (four) times daily. 06/05/18   Roma Schanz R, DO   prednisoLONE acetate (PRED FORTE) 1 % ophthalmic suspension INSTILL 1 DROP INTO RIGHT EYE 2 TIMES A DAY 01/18/21   Rankin, Clent Demark, MD  simvastatin (ZOCOR) 40 MG tablet TAKE 1 TABLET BY MOUTH EVERY DAY AT 6 PM Patient not taking: Reported on 11/10/2022 08/29/20   Ann Held, DO      Allergies    Pneumococcal vaccines    Review of Systems   Review of Systems  Unable to perform ROS: Dementia    Physical Exam Updated Vital Signs BP (!) 157/67 (BP Location: Left Arm)   Pulse 82   Temp 97.7 F (36.5 C) (Oral)   SpO2 (!) 89%  Physical Exam Vitals and nursing note reviewed.   89 year old female, resting comfortably and in no acute distress. Vital signs are significant for elevated blood pressure. Oxygen saturation is 89%, which is hypoxic, and she has been placed on supplemental nasal oxygen. Head is normocephalic.  She has significant nasal swelling with abrasions present on the nose.  Raccoon eyes are present.  There is bleeding from an actinic keratosis on her forehead near the hairline, but no discrete laceration is seen. PERRLA. No obvious oral injury. Neck is immobilized in a stiff cervical collar and is grossly nontender. Back is nontender. Lungs are clear without rales, wheezes, or rhonchi. Chest is nontender. Heart has regular rate and rhythm without murmur. Abdomen is soft, flat, nontender. Pelvis  is stable and nontender. Extremities have no cyanosis or edema, full range of motion is present without pain. Skin is warm and dry without rash. Neurologic: Awake but will not answer questions.  The only thing she says is "it hurts". Cranial nerves are grossly intact, moves all extremities equally.    ED Results / Procedures / Treatments   Labs (all labs ordered are listed, but only abnormal results are displayed) Labs Reviewed - No data to display  Radiology CT Head Wo Contrast  Result Date: 11/23/2022 CLINICAL DATA:  Unwitnessed fall, right eye hematoma EXAM: CT  HEAD WITHOUT CONTRAST CT CERVICAL SPINE WITHOUT CONTRAST TECHNIQUE: Multidetector CT imaging of the head and cervical spine was performed following the standard protocol without intravenous contrast. Multiplanar CT image reconstructions of the cervical spine were also generated. RADIATION DOSE REDUCTION: This exam was performed according to the departmental dose-optimization program which includes automated exposure control, adjustment of the mA and/or kV according to patient size and/or use of iterative reconstruction technique. COMPARISON:  CT head dated 03/21/2022. FINDINGS: CT HEAD FINDINGS Brain: No evidence of acute infarction, hemorrhage, hydrocephalus, extra-axial collection or mass lesion/mass effect. Subcortical white matter and periventricular small vessel ischemic changes. Vascular: Mild intracranial atherosclerosis. Skull: Normal. Negative for fracture or focal lesion. Sinuses/Orbits: Hyperdense material in the right maxillary sinus (series 3/image 1). This may be related to prior maxillofacial fractures (noted in May 2023), but if there is concern for acute injury on exam, consider maxillofacial CT. Visualized paranasal sinuses and mastoid air cells are otherwise clear. Prosthetic right globe. Other: Mild soft tissue swelling overlying the right orbit/lateral zygoma. CT CERVICAL SPINE FINDINGS Alignment: Normal cervical lordosis. Skull base and vertebrae: No acute fracture. No primary bone lesion or focal pathologic process. Soft tissues and spinal canal: No prevertebral fluid or swelling. No visible canal hematoma. Disc levels: Mild degenerative changes at C5-6. Spinal canal is patent. Upper chest: Visualized lung apices are clear. Other: None. IMPRESSION: Soft tissue swelling overlying the right orbit. No evidence of acute intracranial abnormality. Small vessel ischemic changes. No traumatic injury to the cervical spine. Mild degenerative changes. Hyperdense material in the right maxillary sinus,  possibly related to prior maxillofacial fractures. However, if there is concern for acute injury on exam, consider maxillofacial CT. Electronically Signed   By: Julian Hy M.D.   On: 11/23/2022 01:20   CT Cervical Spine Wo Contrast  Result Date: 11/23/2022 CLINICAL DATA:  Unwitnessed fall, right eye hematoma EXAM: CT HEAD WITHOUT CONTRAST CT CERVICAL SPINE WITHOUT CONTRAST TECHNIQUE: Multidetector CT imaging of the head and cervical spine was performed following the standard protocol without intravenous contrast. Multiplanar CT image reconstructions of the cervical spine were also generated. RADIATION DOSE REDUCTION: This exam was performed according to the departmental dose-optimization program which includes automated exposure control, adjustment of the mA and/or kV according to patient size and/or use of iterative reconstruction technique. COMPARISON:  CT head dated 03/21/2022. FINDINGS: CT HEAD FINDINGS Brain: No evidence of acute infarction, hemorrhage, hydrocephalus, extra-axial collection or mass lesion/mass effect. Subcortical white matter and periventricular small vessel ischemic changes. Vascular: Mild intracranial atherosclerosis. Skull: Normal. Negative for fracture or focal lesion. Sinuses/Orbits: Hyperdense material in the right maxillary sinus (series 3/image 1). This may be related to prior maxillofacial fractures (noted in May 2023), but if there is concern for acute injury on exam, consider maxillofacial CT. Visualized paranasal sinuses and mastoid air cells are otherwise clear. Prosthetic right globe. Other: Mild soft tissue swelling overlying the right orbit/lateral zygoma.  CT CERVICAL SPINE FINDINGS Alignment: Normal cervical lordosis. Skull base and vertebrae: No acute fracture. No primary bone lesion or focal pathologic process. Soft tissues and spinal canal: No prevertebral fluid or swelling. No visible canal hematoma. Disc levels: Mild degenerative changes at C5-6. Spinal canal is  patent. Upper chest: Visualized lung apices are clear. Other: None. IMPRESSION: Soft tissue swelling overlying the right orbit. No evidence of acute intracranial abnormality. Small vessel ischemic changes. No traumatic injury to the cervical spine. Mild degenerative changes. Hyperdense material in the right maxillary sinus, possibly related to prior maxillofacial fractures. However, if there is concern for acute injury on exam, consider maxillofacial CT. Electronically Signed   By: Julian Hy M.D.   On: 11/23/2022 01:20   DG Pelvis 1-2 Views  Result Date: 11/23/2022 CLINICAL DATA:  Recent fall with pelvic pain, initial encounter EXAM: PELVIS - 2 VIEW COMPARISON:  None Available. FINDINGS: Postsurgical changes are noted proximal left femur. No acute fracture is noted. Degenerative changes of the hip joints are seen. Lumbar degenerative changes are noted as well. Pelvic ring is intact. IMPRESSION: No acute abnormality noted. Electronically Signed   By: Inez Catalina M.D.   On: 11/23/2022 00:59   DG ELBOW COMPLETE RIGHT (3+VIEW)  Result Date: 11/23/2022 CLINICAL DATA:  Recent fall with right elbow pain, initial encounter EXAM: RIGHT ELBOW - COMPLETE 3+ VIEW COMPARISON:  None Available. FINDINGS: There is no evidence of fracture, dislocation, or joint effusion. There is no evidence of arthropathy or other focal bone abnormality. Soft tissues are unremarkable. IMPRESSION: No acute abnormality noted. Electronically Signed   By: Inez Catalina M.D.   On: 11/23/2022 00:58   DG Shoulder Right  Result Date: 11/23/2022 CLINICAL DATA:  Recent fall with right shoulder pain, initial encounter EXAM: RIGHT SHOULDER - 2+ VIEW COMPARISON:  None Available. FINDINGS: There is no evidence of fracture or dislocation. There is no evidence of arthropathy or other focal bone abnormality. Soft tissues are unremarkable. IMPRESSION: No acute abnormality noted. Electronically Signed   By: Inez Catalina M.D.   On: 11/23/2022 00:58     Procedures Procedures    Medications Ordered in ED Medications  Tdap (BOOSTRIX) injection 0.5 mL (has no administration in time range)    ED Course/ Medical Decision Making/ A&P                             Medical Decision Making Amount and/or Complexity of Data Reviewed Radiology: ordered.  Risk Prescription drug management.   Fall at nursing home with significant facial trauma.  I have ordered CT scans of head, maxillofacial bones, cervical spine to look for evidence of fracture or intracranial injury.  I have reviewed her past records, and she was actually in the emergency department yesterday for a similar fall at which time CT of head and cervical spine showed hyperdense material in the right maxillary sinus which was felt to be related to prior maxillofacial fractures.  I have ordered CT scans of head, cervical spine, maxillofacial bones.  Also, last tetanus immunization I find on record was from 2006, I have ordered a Tdap booster.  CT scan shows subdural hematoma present without midline shift.  I have independently viewed the images and agree with radiologist interpretation.  I have also independently discussed the findings with the radiologist.  Family member is here who confirms patient is DNR although he is not certain whether he would want surgery if needed.  Additional findings include minimally displaced nasal fracture, no evidence of C-spine fracture.  I have discussed the case with Dr. Kathyrn Sheriff of neurosurgery who has reviewed the CT scans and does not feel that any acute intervention is needed.  He recommends return to her skilled nursing facility and return only for change in neurostatus.  Family member is here and I have explained this to him who expresses understanding.  Final Clinical Impression(s) / ED Diagnoses Final diagnoses:  Fall at nursing home, initial encounter  Subdural hematoma (Rew)  Contusion of face, initial encounter  Elevated blood pressure  reading with diagnosis of hypertension  Closed fracture of nasal bone, initial encounter    Rx / DC Orders ED Discharge Orders     None         Delora Fuel, MD 19/62/22 [id number]

## 2022-11-24 NOTE — Discharge Instructions (Addendum)
Apply ice to areas that are swollen or painful.  She may take acetaminophen as needed for pain.  Return to the emergency department if she has any change in her neurologic status.

## 2022-11-24 NOTE — ED Triage Notes (Signed)
Pt arrives [facility] from SNF [facility]) for an unwitnessed fall.

## 2023-01-14 ENCOUNTER — Encounter (INDEPENDENT_AMBULATORY_CARE_PROVIDER_SITE_OTHER): Payer: Self-pay

## 2023-01-14 ENCOUNTER — Encounter (INDEPENDENT_AMBULATORY_CARE_PROVIDER_SITE_OTHER): Payer: Medicare PPO | Admitting: Ophthalmology

## 2023-02-06 ENCOUNTER — Non-Acute Institutional Stay: Payer: Self-pay | Admitting: Family Medicine

## 2023-02-06 NOTE — Progress Notes (Signed)
   Therapist, nutritional Palliative Care Consult Note Telephone: [phone]  Fax: [phone]   Date of encounter: 02/06/23 11:31 AM  Micah Flesher for visit with patient and discovered that she had been accepted into another Hospice agency back in February following a fall with subdural hematoma.  Will close case.   Joycelyn Man FNP-C  [email or web address] .[facility]  Phone:  [phone]

## 2023-02-18 ENCOUNTER — Encounter: Payer: Self-pay | Admitting: Family Medicine

## 2024-03-22 DEATH — deceased
